# Patient Record
Sex: Female | Born: 1935 | Race: White | Hispanic: No | Marital: Married | Smoking: Former smoker
Health system: Southern US, Community
[De-identification: ages and names within clinical notes are randomized; demographics above are authoritative.]

## PROBLEM LIST (undated history)

## (undated) DIAGNOSIS — K219 Gastro-esophageal reflux disease without esophagitis: Secondary | ICD-10-CM

## (undated) DIAGNOSIS — I639 Cerebral infarction, unspecified: Secondary | ICD-10-CM

## (undated) DIAGNOSIS — I10 Essential (primary) hypertension: Secondary | ICD-10-CM

## (undated) DIAGNOSIS — N200 Calculus of kidney: Secondary | ICD-10-CM

## (undated) DIAGNOSIS — C50919 Malignant neoplasm of unspecified site of unspecified female breast: Secondary | ICD-10-CM

## (undated) DIAGNOSIS — C3490 Malignant neoplasm of unspecified part of unspecified bronchus or lung: Secondary | ICD-10-CM

## (undated) DIAGNOSIS — J449 Chronic obstructive pulmonary disease, unspecified: Secondary | ICD-10-CM

## (undated) HISTORY — PX: CHOLECYSTECTOMY: SHX55

## (undated) HISTORY — PX: BREAST LUMPECTOMY: SHX2

## (undated) HISTORY — PX: HAND SURGERY: SHX662

## (undated) HISTORY — PX: ABDOMINAL HYSTERECTOMY: SHX81

---

## 2004-11-22 ENCOUNTER — Ambulatory Visit: Payer: Self-pay | Admitting: Gastroenterology

## 2004-11-26 ENCOUNTER — Ambulatory Visit: Payer: Self-pay | Admitting: Gastroenterology

## 2004-12-04 ENCOUNTER — Ambulatory Visit: Payer: Self-pay | Admitting: Gastroenterology

## 2005-05-09 ENCOUNTER — Ambulatory Visit: Payer: Self-pay | Admitting: Internal Medicine

## 2005-06-20 ENCOUNTER — Ambulatory Visit: Payer: Self-pay | Admitting: Internal Medicine

## 2006-02-12 ENCOUNTER — Ambulatory Visit: Payer: Self-pay | Admitting: Internal Medicine

## 2006-05-06 ENCOUNTER — Ambulatory Visit: Payer: Self-pay | Admitting: Physical Medicine and Rehabilitation

## 2006-08-19 ENCOUNTER — Ambulatory Visit: Payer: Self-pay | Admitting: Gastroenterology

## 2006-08-21 ENCOUNTER — Ambulatory Visit: Payer: Self-pay | Admitting: Gastroenterology

## 2006-09-15 ENCOUNTER — Ambulatory Visit: Payer: Self-pay | Admitting: Surgery

## 2006-09-17 ENCOUNTER — Ambulatory Visit: Payer: Self-pay | Admitting: Gastroenterology

## 2006-09-23 ENCOUNTER — Ambulatory Visit: Payer: Self-pay | Admitting: Surgery

## 2007-05-04 ENCOUNTER — Ambulatory Visit: Payer: Self-pay | Admitting: Unknown Physician Specialty

## 2007-06-30 ENCOUNTER — Ambulatory Visit: Payer: Self-pay | Admitting: Internal Medicine

## 2007-07-21 ENCOUNTER — Ambulatory Visit: Payer: Self-pay | Admitting: Internal Medicine

## 2007-10-02 ENCOUNTER — Other Ambulatory Visit: Payer: Self-pay

## 2007-10-02 ENCOUNTER — Emergency Department: Payer: Self-pay | Admitting: Emergency Medicine

## 2007-11-12 ENCOUNTER — Ambulatory Visit: Payer: Self-pay | Admitting: Internal Medicine

## 2008-01-29 DIAGNOSIS — I639 Cerebral infarction, unspecified: Secondary | ICD-10-CM

## 2008-01-29 HISTORY — DX: Cerebral infarction, unspecified: I63.9

## 2008-07-12 ENCOUNTER — Ambulatory Visit: Payer: Self-pay | Admitting: Internal Medicine

## 2008-08-12 ENCOUNTER — Ambulatory Visit: Payer: Self-pay | Admitting: Neurology

## 2008-10-12 ENCOUNTER — Ambulatory Visit: Payer: Self-pay | Admitting: Internal Medicine

## 2009-01-11 ENCOUNTER — Ambulatory Visit: Payer: Self-pay | Admitting: Internal Medicine

## 2009-10-03 ENCOUNTER — Ambulatory Visit: Payer: Self-pay | Admitting: Gastroenterology

## 2009-10-04 LAB — PATHOLOGY REPORT

## 2009-10-20 ENCOUNTER — Ambulatory Visit: Payer: Self-pay | Admitting: Otolaryngology

## 2010-02-08 ENCOUNTER — Ambulatory Visit: Payer: Self-pay | Admitting: Internal Medicine

## 2010-02-13 ENCOUNTER — Ambulatory Visit: Payer: Self-pay | Admitting: Internal Medicine

## 2010-05-24 ENCOUNTER — Ambulatory Visit: Payer: Self-pay

## 2010-05-31 ENCOUNTER — Ambulatory Visit: Payer: Self-pay | Admitting: Internal Medicine

## 2010-07-27 ENCOUNTER — Ambulatory Visit: Payer: Self-pay | Admitting: Internal Medicine

## 2010-08-16 ENCOUNTER — Ambulatory Visit: Payer: Self-pay | Admitting: Internal Medicine

## 2011-01-29 DIAGNOSIS — C50919 Malignant neoplasm of unspecified site of unspecified female breast: Secondary | ICD-10-CM

## 2011-01-29 HISTORY — DX: Malignant neoplasm of unspecified site of unspecified female breast: C50.919

## 2011-03-21 ENCOUNTER — Other Ambulatory Visit: Payer: Self-pay | Admitting: Ophthalmology

## 2011-03-21 LAB — CREATININE, SERUM: Creatinine: 1.44 mg/dL — ABNORMAL HIGH (ref 0.60–1.30)

## 2011-03-26 ENCOUNTER — Ambulatory Visit: Payer: Self-pay | Admitting: Ophthalmology

## 2011-09-17 ENCOUNTER — Ambulatory Visit: Payer: Self-pay | Admitting: Internal Medicine

## 2011-09-18 ENCOUNTER — Ambulatory Visit: Payer: Self-pay | Admitting: Internal Medicine

## 2011-10-01 ENCOUNTER — Ambulatory Visit: Payer: Self-pay | Admitting: Surgery

## 2011-10-08 ENCOUNTER — Ambulatory Visit: Payer: Self-pay | Admitting: Surgery

## 2011-10-08 DIAGNOSIS — I10 Essential (primary) hypertension: Secondary | ICD-10-CM

## 2011-10-11 ENCOUNTER — Ambulatory Visit: Payer: Self-pay | Admitting: Surgery

## 2011-10-16 LAB — PATHOLOGY REPORT

## 2011-10-29 ENCOUNTER — Ambulatory Visit: Payer: Self-pay | Admitting: Hematology and Oncology

## 2011-11-20 LAB — CBC CANCER CENTER
Basophil #: 0.1 x10 3/mm (ref 0.0–0.1)
Basophil %: 1.9 %
Eosinophil #: 0.1 x10 3/mm (ref 0.0–0.7)
HCT: 35.7 % (ref 35.0–47.0)
Lymphocyte %: 22.2 %
MCH: 30.2 pg (ref 26.0–34.0)
MCHC: 32.4 g/dL (ref 32.0–36.0)
Monocyte #: 0.6 x10 3/mm (ref 0.2–0.9)
Monocyte %: 11.1 %
Neutrophil #: 3.4 x10 3/mm (ref 1.4–6.5)
Neutrophil %: 62.1 %
Platelet: 279 x10 3/mm (ref 150–440)
RDW: 13.8 % (ref 11.5–14.5)
WBC: 5.4 x10 3/mm (ref 3.6–11.0)

## 2011-11-27 LAB — CBC CANCER CENTER
Basophil %: 2.1 %
Eosinophil %: 2.7 %
HCT: 35.9 % (ref 35.0–47.0)
HGB: 11.6 g/dL — ABNORMAL LOW (ref 12.0–16.0)
Lymphocyte %: 21 %
MCH: 30.4 pg (ref 26.0–34.0)
MCV: 94 fL (ref 80–100)
Monocyte #: 0.5 x10 3/mm (ref 0.2–0.9)
Monocyte %: 10.4 %
Neutrophil #: 3.2 x10 3/mm (ref 1.4–6.5)
Neutrophil %: 63.8 %
Platelet: 266 x10 3/mm (ref 150–440)
RBC: 3.82 10*6/uL (ref 3.80–5.20)
WBC: 5.1 x10 3/mm (ref 3.6–11.0)

## 2011-11-29 ENCOUNTER — Ambulatory Visit: Payer: Self-pay | Admitting: Hematology and Oncology

## 2011-12-04 LAB — CBC CANCER CENTER
Basophil #: 0.1 x10 3/mm (ref 0.0–0.1)
Eosinophil %: 1.8 %
HCT: 34.6 % — ABNORMAL LOW (ref 35.0–47.0)
Lymphocyte #: 0.9 x10 3/mm — ABNORMAL LOW (ref 1.0–3.6)
MCHC: 32.7 g/dL (ref 32.0–36.0)
MCV: 94 fL (ref 80–100)
Monocyte %: 9.8 %
Neutrophil #: 5.6 x10 3/mm (ref 1.4–6.5)
RBC: 3.67 10*6/uL — ABNORMAL LOW (ref 3.80–5.20)
RDW: 13.3 % (ref 11.5–14.5)
WBC: 7.4 x10 3/mm (ref 3.6–11.0)

## 2011-12-11 LAB — CBC CANCER CENTER
Basophil #: 0.1 x10 3/mm (ref 0.0–0.1)
Eosinophil #: 0.2 x10 3/mm (ref 0.0–0.7)
Eosinophil %: 3.7 %
Lymphocyte #: 0.9 x10 3/mm — ABNORMAL LOW (ref 1.0–3.6)
Lymphocyte %: 17.2 %
MCH: 30.7 pg (ref 26.0–34.0)
MCHC: 32.7 g/dL (ref 32.0–36.0)
MCV: 94 fL (ref 80–100)
Monocyte #: 0.7 x10 3/mm (ref 0.2–0.9)
Neutrophil %: 65.4 %
Platelet: 238 x10 3/mm (ref 150–440)
RBC: 3.84 10*6/uL (ref 3.80–5.20)
RDW: 13.4 % (ref 11.5–14.5)
WBC: 5.5 x10 3/mm (ref 3.6–11.0)

## 2011-12-18 LAB — CBC CANCER CENTER
Basophil #: 0.1 x10 3/mm (ref 0.0–0.1)
Basophil %: 1.2 %
Eosinophil #: 0.1 x10 3/mm (ref 0.0–0.7)
HGB: 11.5 g/dL — ABNORMAL LOW (ref 12.0–16.0)
Lymphocyte #: 0.6 x10 3/mm — ABNORMAL LOW (ref 1.0–3.6)
Lymphocyte %: 8.8 %
MCHC: 32.3 g/dL (ref 32.0–36.0)
Neutrophil #: 5.6 x10 3/mm (ref 1.4–6.5)
Platelet: 221 x10 3/mm (ref 150–440)
RBC: 3.75 10*6/uL — ABNORMAL LOW (ref 3.80–5.20)
RDW: 13.7 % (ref 11.5–14.5)
WBC: 7.1 x10 3/mm (ref 3.6–11.0)

## 2011-12-24 LAB — CBC CANCER CENTER
Basophil #: 0.1 x10 3/mm (ref 0.0–0.1)
Eosinophil #: 0.2 x10 3/mm (ref 0.0–0.7)
HCT: 34.4 % — ABNORMAL LOW (ref 35.0–47.0)
Lymphocyte %: 15.5 %
MCH: 30.8 pg (ref 26.0–34.0)
MCHC: 32.5 g/dL (ref 32.0–36.0)
Monocyte #: 0.6 x10 3/mm (ref 0.2–0.9)
Neutrophil %: 66.8 %
Platelet: 193 x10 3/mm (ref 150–440)
RBC: 3.63 10*6/uL — ABNORMAL LOW (ref 3.80–5.20)
WBC: 4.9 x10 3/mm (ref 3.6–11.0)

## 2011-12-29 ENCOUNTER — Ambulatory Visit: Payer: Self-pay | Admitting: Hematology and Oncology

## 2012-01-01 LAB — CBC CANCER CENTER
Eosinophil #: 0.2 x10 3/mm (ref 0.0–0.7)
Eosinophil %: 3.5 %
HCT: 36.3 % (ref 35.0–47.0)
Lymphocyte %: 17.6 %
MCV: 93 fL (ref 80–100)
Monocyte #: 0.6 x10 3/mm (ref 0.2–0.9)
Monocyte %: 12.4 %
Neutrophil %: 65.2 %
Platelet: 219 x10 3/mm (ref 150–440)
RBC: 3.93 10*6/uL (ref 3.80–5.20)
WBC: 5.2 x10 3/mm (ref 3.6–11.0)

## 2012-01-29 ENCOUNTER — Ambulatory Visit: Payer: Self-pay | Admitting: Hematology and Oncology

## 2012-06-24 ENCOUNTER — Ambulatory Visit: Payer: Self-pay | Admitting: Gastroenterology

## 2012-06-25 LAB — PATHOLOGY REPORT

## 2012-09-16 ENCOUNTER — Ambulatory Visit: Payer: Self-pay | Admitting: Surgery

## 2012-12-06 ENCOUNTER — Emergency Department (HOSPITAL_COMMUNITY): Payer: Medicare Other

## 2012-12-06 ENCOUNTER — Encounter (HOSPITAL_COMMUNITY): Payer: Self-pay | Admitting: Emergency Medicine

## 2012-12-06 ENCOUNTER — Observation Stay (HOSPITAL_COMMUNITY)
Admission: EM | Admit: 2012-12-06 | Discharge: 2012-12-07 | Disposition: A | Discharge status: Home / Self Care | Payer: Medicare Other | Attending: Internal Medicine | Admitting: Internal Medicine

## 2012-12-06 DIAGNOSIS — R5381 Other malaise: Secondary | ICD-10-CM | POA: Insufficient documentation

## 2012-12-06 DIAGNOSIS — Z853 Personal history of malignant neoplasm of breast: Secondary | ICD-10-CM | POA: Insufficient documentation

## 2012-12-06 DIAGNOSIS — Z87891 Personal history of nicotine dependence: Secondary | ICD-10-CM | POA: Insufficient documentation

## 2012-12-06 DIAGNOSIS — Z8673 Personal history of transient ischemic attack (TIA), and cerebral infarction without residual deficits: Secondary | ICD-10-CM | POA: Insufficient documentation

## 2012-12-06 DIAGNOSIS — R55 Syncope and collapse: Principal | ICD-10-CM | POA: Diagnosis present

## 2012-12-06 DIAGNOSIS — Z880 Allergy status to penicillin: Secondary | ICD-10-CM | POA: Insufficient documentation

## 2012-12-06 DIAGNOSIS — I10 Essential (primary) hypertension: Secondary | ICD-10-CM | POA: Diagnosis present

## 2012-12-06 DIAGNOSIS — Z87442 Personal history of urinary calculi: Secondary | ICD-10-CM | POA: Insufficient documentation

## 2012-12-06 HISTORY — DX: Malignant neoplasm of unspecified site of unspecified female breast: C50.919

## 2012-12-06 HISTORY — DX: Essential (primary) hypertension: I10

## 2012-12-06 HISTORY — DX: Cerebral infarction, unspecified: I63.9

## 2012-12-06 HISTORY — DX: Calculus of kidney: N20.0

## 2012-12-06 LAB — POCT I-STAT, CHEM 8
Chloride: 103 mEq/L (ref 96–112)
Creatinine, Ser: 1.5 mg/dL — ABNORMAL HIGH (ref 0.50–1.10)
Glucose, Bld: 142 mg/dL — ABNORMAL HIGH (ref 70–99)
HCT: 37 % (ref 36.0–46.0)
Potassium: 3.4 mEq/L — ABNORMAL LOW (ref 3.5–5.1)
Sodium: 137 mEq/L (ref 135–145)

## 2012-12-06 LAB — GLUCOSE, CAPILLARY: Glucose-Capillary: 137 mg/dL — ABNORMAL HIGH (ref 70–99)

## 2012-12-06 LAB — TROPONIN I: Troponin I: <0.3 ng/mL (ref <0.30)

## 2012-12-06 LAB — CBC WITH DIFFERENTIAL/PLATELET
Basophils Absolute: 0.1 10*3/uL (ref 0.0–0.1)
Basophils Relative: 1 % (ref 0–1)
Eosinophils Absolute: 0.2 10*3/uL (ref 0.0–0.7)
Eosinophils Relative: 2 % (ref 0–5)
Lymphs Abs: 2 10*3/uL (ref 0.7–4.0)
MCH: 31.3 pg (ref 26.0–34.0)
Neutro Abs: 6.2 10*3/uL (ref 1.7–7.7)
Neutrophils Relative %: 67 % (ref 43–77)
Platelets: 291 10*3/uL (ref 150–400)
RBC: 3.8 MIL/uL — ABNORMAL LOW (ref 3.87–5.11)
RDW: 13.1 % (ref 11.5–15.5)

## 2012-12-06 LAB — COMPREHENSIVE METABOLIC PANEL
ALT: 8 U/L (ref 0–35)
AST: 15 U/L (ref 0–37)
Albumin: 3.2 g/dL — ABNORMAL LOW (ref 3.5–5.2)
Alkaline Phosphatase: 62 U/L (ref 39–117)
BUN: 23 mg/dL (ref 6–23)
CO2: 25 mEq/L (ref 19–32)
Chloride: 98 mEq/L (ref 96–112)
Creatinine, Ser: 1.26 mg/dL — ABNORMAL HIGH (ref 0.50–1.10)
Potassium: 3.8 mEq/L (ref 3.5–5.1)
Sodium: 133 mEq/L — ABNORMAL LOW (ref 135–145)
Total Bilirubin: 0.3 mg/dL (ref 0.3–1.2)
Total Protein: 5.9 g/dL — ABNORMAL LOW (ref 6.0–8.3)

## 2012-12-06 LAB — URINALYSIS, ROUTINE W REFLEX MICROSCOPIC
Glucose, UA: NEGATIVE mg/dL
Ketones, ur: NEGATIVE mg/dL
Leukocytes, UA: NEGATIVE
Nitrite: NEGATIVE
Protein, ur: NEGATIVE mg/dL
Specific Gravity, Urine: 1.013 (ref 1.005–1.030)
Urobilinogen, UA: 0.2 mg/dL (ref 0.0–1.0)

## 2012-12-06 LAB — URINE MICROSCOPIC-ADD ON

## 2012-12-06 LAB — MAGNESIUM: Magnesium: 1.6 mg/dL (ref 1.5–2.5)

## 2012-12-06 LAB — POCT I-STAT TROPONIN I

## 2012-12-06 MED ORDER — ACETAMINOPHEN 500 MG PO TABS
1000.0000 mg | ORAL_TABLET | Freq: Every evening | ORAL | Status: DC | PRN
  Administered 2012-12-06: 1000 mg via ORAL
  Filled 2012-12-06: qty 2

## 2012-12-06 MED ORDER — HYDROCHLOROTHIAZIDE 25 MG PO TABS
25.0000 mg | ORAL_TABLET | ORAL | Status: DC
  Filled 2012-12-06: qty 1

## 2012-12-06 MED ORDER — SODIUM CHLORIDE 0.9 % IJ SOLN: 3.0000 mL | INTRAMUSCULAR | Status: DC | PRN

## 2012-12-06 MED ORDER — ENALAPRIL MALEATE 20 MG PO TABS: 20.0000 mg | ORAL_TABLET | Freq: Every day | ORAL | Status: DC

## 2012-12-06 MED ORDER — HEPARIN SODIUM (PORCINE) 5000 UNIT/ML IJ SOLN
5000.0000 [IU] | Freq: Three times a day (TID) | INTRAMUSCULAR | Status: DC
  Administered 2012-12-06 – 2012-12-07 (×2): 5000 [IU] via SUBCUTANEOUS
  Filled 2012-12-06 (×5): qty 1

## 2012-12-06 MED ORDER — SODIUM CHLORIDE 0.9 % IJ SOLN
3.0000 mL | Freq: Two times a day (BID) | INTRAMUSCULAR | Status: DC
  Administered 2012-12-06: 3 mL via INTRAVENOUS

## 2012-12-06 MED ORDER — CLOPIDOGREL BISULFATE 75 MG PO TABS
75.0000 mg | ORAL_TABLET | Freq: Every day | ORAL | Status: DC
  Administered 2012-12-06 – 2012-12-07 (×2): 75 mg via ORAL
  Filled 2012-12-06 (×2): qty 1

## 2012-12-06 MED ORDER — ONDANSETRON HCL 4 MG/2ML IJ SOLN
4.0000 mg | Freq: Once | INTRAMUSCULAR | Status: AC
  Administered 2012-12-06: 4 mg via INTRAVENOUS
  Filled 2012-12-06: qty 2

## 2012-12-06 MED ORDER — SODIUM CHLORIDE 0.9 % IJ SOLN
3.0000 mL | Freq: Two times a day (BID) | INTRAMUSCULAR | Status: DC
  Administered 2012-12-07: 3 mL via INTRAVENOUS

## 2012-12-06 MED ORDER — SODIUM CHLORIDE 0.9 % IV SOLN: 250.0000 mL | INTRAVENOUS | Status: DC | PRN

## 2012-12-06 MED ORDER — PANTOPRAZOLE SODIUM 40 MG PO TBEC: 40.0000 mg | DELAYED_RELEASE_TABLET | Freq: Every day | ORAL | Status: DC

## 2012-12-06 NOTE — ED Provider Notes (Signed)
CSN: [id number]     Arrival date & time 12/06/12  1511 History   First MD Initiated Contact with Patient 12/06/12 1514     Chief Complaint  Patient presents with  . Code Stroke   (Consider location/radiation/quality/duration/timing/severity/associated sxs/prior Treatment) HPI History limited by critical acuity of code stroke.    Pt arrived from church function by [facility]. While eating at church function pt stated to family that she was not feeling well and then laid her head down and had a syncopal episode.Sudden onset. Family Denies any fall or hitting head. When fire arrived they noted pt to have right sided weakness and right pupil react slower but EMS did not see that. EMS stated slurred speech until halfway here and started coming around and speech cleared.       Past Medical History  Diagnosis Date  . Hypertension   . Breast cancer 2013    Left side, s/p lumpectomy and radiation  . CVA (cerebral infarction) 2010    Was on Plavix for 2 years, then switched to clopidogrel but d/c due to side effects ("sick")  . Nephrolithiasis     per patient, passes on her own   Past Surgical History  Procedure Laterality Date  . Breast lumpectomy      left side  . Abdominal hysterectomy  1960s    "bleeding profusely"  . Cholecystectomy    . Hand surgery      s/p fall   Family History  Problem Relation Age of Onset  . Throat cancer Mother     heavy smoker & drinker, died at 77 yo (1978)  . Heart failure Father   . Hypertension Sister   . Stroke Sister    History  Substance Use Topics  . Smoking status: Former Smoker -- 0.50 packs/day for 30 years    Types: Cigarettes    Quit date: 12/07/1978  . Smokeless tobacco: Not on file  . Alcohol Use: No   OB History   Grav Para Term Preterm Abortions TAB SAB Ect Mult Living                 Review of Systems .  History limited by critical acuity of code stroke.     Allergies  Aspirin and Penicillins  Home  Medications   No current outpatient prescriptions on file. BP 126/57  Pulse 89  Temp(Src) 98.8 F (37.1 C) (Oral)  Resp 18  Ht 5' (1.524 m)  Wt 155 lb 8 oz (70.534 kg)  BMI 30.37 kg/m2  SpO2 98% Physical Exam Nursing note and vitals reviewed.  Constitutional: Pt is awake and appears stated age. Eyes: No injection, no scleral icterus. HENT: Atraumatic, airway open without erythema or exudate.  Respiratory: No respiratory distress. Equal breathing bilaterally. Cardiovascular: Normal rate. Extremities warm and well perfused.  Abdomen: Soft, non-tender. MSK: Extremities are atraumatic without deformity. Skin: No rash, no wounds.   Neuro: GCS 15, pt oriented to self, able to move all 4 extremities. No obvious facial droop.      ED Course  Procedures (including critical care time) Labs Review Labs Reviewed  GLUCOSE, CAPILLARY - Abnormal; Notable for the following:    Glucose-Capillary 137 (*)    All other components within normal limits  CBC WITH DIFFERENTIAL - Abnormal; Notable for the following:    RBC 3.80 (*)    Hemoglobin 11.9 (*)    HCT 35.2 (*)    All other components within normal limits  URINALYSIS, ROUTINE W  REFLEX MICROSCOPIC - Abnormal; Notable for the following:    Hgb urine dipstick SMALL (*)    All other components within normal limits  COMPREHENSIVE METABOLIC PANEL - Abnormal; Notable for the following:    Sodium 133 (*)    Glucose, Bld 117 (*)    Creatinine, Ser 1.26 (*)    Total Protein 5.9 (*)    Albumin 3.2 (*)    GFR calc non Af Amer 39 (*)    GFR calc Af Amer 45 (*)    All other components within normal limits  POCT I-STAT, CHEM 8 - Abnormal; Notable for the following:    Potassium 3.4 (*)    Creatinine, Ser 1.50 (*)    Glucose, Bld 142 (*)    All other components within normal limits  URINE MICROSCOPIC-ADD ON  TROPONIN I  MAGNESIUM  TROPONIN I  TSH  POCT I-STAT TROPONIN I   Imaging Review Ct Head Wo Contrast  12/06/2012   CLINICAL  DATA:  Code stroke, right-sided weakness  EXAM: CT HEAD WITHOUT CONTRAST  TECHNIQUE: Contiguous axial images were obtained from the base of the skull through the vertex without intravenous contrast.  COMPARISON:  None.  FINDINGS: Diffuse atrophy. No abnormal attenuation to suggest hemorrhage, extra-axial fluid, or vascular territory infarct. Calvarium is intact. No hydrocephalus.  IMPRESSION: No acute findings radiographically. Critical Value/emergent results were called by telephone at the time of interpretation on 12/06/2012 at 3:41 PM to [facility] CAMILO , who verbally acknowledged these results.   Electronically Signed   By: Esperanza Heir M.D.   On: 12/06/2012 15:42   Mr Brain Wo Contrast  12/06/2012   CLINICAL DATA:  Syncope and lethargy  EXAM: MRI HEAD WITHOUT CONTRAST  TECHNIQUE: Multiplanar, multiecho pulse sequences of the brain and surrounding structures were obtained without intravenous contrast.  COMPARISON:  CT head of the same day.  FINDINGS: The diffusion-weighted images demonstrate no evidence for acute or subacute infarction. At 12 mm meningioma is noted just to the right of midline near the vertex.  Moderate generalized atrophy and white matter disease is present are remote lacunar infarcts within the right basal ganglia. Remote lacunar infarcts are present in the cerebellum as well.  Flow is present in the major intracranial arteries. Patient is status post bilateral lens. A polyp or mucous retention cyst is present in the right maxillary sinus. Mild mucosal thickening is present in the ethmoid air cells.  IMPRESSION: 1. No acute intracranial abnormality. 2. Moderate generalized atrophy and white matter disease. This likely reflects ischemia. 3. Remote lacunar infarcts of the right basal ganglia and bilateral cerebellum. 4. 12 mm meningioma just to the right of midline near the vertex.   Electronically Signed   By: Gennette Pac M.D.   On: 12/06/2012 18:45    EKG Interpretation      Ventricular Rate:  77 PR Interval:  163 QRS Duration: 86 QT Interval:  419 QTC Calculation: 474 R Axis:   26 Text Interpretation:  Sinus rhythm RSR' in V1 or V2, probably normal variant No old tracing to compare           MDM   1. Syncope    79 y.o. female w/ PMHx of HTN, prior stroke presents as code stroke via EMS. Pt seen upon arrival protecting her own airway. Normal glucose. Pt to CT scan. EKG NSR, unremarkable.   CT head without bleed per my read. Neurology at bedside. Pt in stable condition on re-eval. Discussed with neuro who recommend  medicine admission. Labs reviewed. Pt admitted in stable condition.     I independently viewed, interpreted, and used in my medical decision making all ordered lab and imaging tests. Medical Decision Making discussed with ED attending Dr. Redgie Grayer.      Charm Barges, MD 12/07/12 [phone]

## 2012-12-06 NOTE — ED Notes (Addendum)
Pt arrived from church function by [facility]. While eating at church function pt stated to family that she was not feeling well and then laid her head down and had a syncopal episode. Family Denies any fall or hitting head. When fire arrived they noted pt to have right sided weakness and right pupil react slower but EMS did not see that. EMS stated slurred speech until halfway here and started coming around and speech cleared. BP-122/66 HR-76

## 2012-12-06 NOTE — ED Notes (Signed)
Transporting patient to new room assignment. 

## 2012-12-06 NOTE — ED Notes (Signed)
Internal Medicine MD at bedside

## 2012-12-06 NOTE — H&P (Signed)
Date: 12/06/2012               Patient Name:  Jo Frank MRN: [id number]  DOB: 07-28-1928 Age / Sex: 79 y.o., female   PCP: Barbette Reichmann, MD         Medical Service: Internal Medicine Teaching Service         Attending Physician: Dr. Burns Spain, MD    First Contact: Dr. Johna Roles Pager: [phone]  Second Contact: Dr. Burtis Junes Pager: [phone]       After Hours (After 5p/  First Contact Pager: [phone]  weekends / holidays): Second Contact Pager: [phone]   Chief Complaint: Syncope  History of Present Illness: Jo Frank is a 79 year old female with a HTN, HLD, CVA, breast CA, Anemia.  She was brought to [facility] by EMS earlier today after she lost conciseness while eating lunch at church.  Patient report that she felt different from her usual self this morning after she got up out of bed.  She reports that she was feeling better at church and was enjoying her meal when she felt ill again and placed her head down on the table and "passed out".  Her daughter reports that she saw her mother placed her head down on the table and became concerned about her.  A nurse in attendance was concerned about a possible stroke and called EMS.  Per the daughter her mother did not seem to regain conscienceness for about 10-15 minutes.  Jo Frank reports that he next thing she remembers was being in the ambulance on the way to the hospital.  Patient reports that before the episode she felt odd and different but no specific symptoms of palpitations, chest pain, N/V, blurry vision, diaphoresis.  She reported no loss of bowel or bladder function, no tongue bitting.  Daughter did not appreciate any convulsive activity.  She reports she continues to feel generally ill like she did before but nothing hurts except for her head after the MRI. She does report a history of syncopal episodes, she reported it happened to her a few times when she was in her 20s-30s but had not had another syncopal episode since her  30s, she never sought medical attention for this. In addition patient reports some increased fatigue over the last few months, she saw her PCP last week who reported she was doing well but had a mild anemia.  She also reports some increased cramping of her extremities over the past year. She reports her CVA was about 4 years ago, she has an allergy to ASA (vomiting blood) so she was placed on Plavix which she took for 2 years until it was changed to generic at which point she reports she was unable to tolerate and stopped taking.   Meds: Current Facility-Administered Medications  Medication Dose Route Frequency Provider Last Rate Last Dose  . clopidogrel (PLAVIX) tablet 75 mg  75 mg Oral Q breakfast Kinnie Scales Rinehuls, PA-C   75 mg at 12/06/12 1704   Current Outpatient Prescriptions  Medication Sig Dispense Refill  . acetaminophen (TYLENOL) 500 MG tablet Take 1,000 mg by mouth at bedtime as needed (pain).      . Cyanocobalamin (VITAMIN B-12 PO) Take 1 tablet by mouth daily. chewable      . enalapril (VASOTEC) 20 MG tablet Take 20 mg by mouth daily.       . hydrochlorothiazide (HYDRODIURIL) 25 MG tablet Take 25 mg by mouth every other day.       Marland Kitchen  hydrocortisone cream 1 % Apply 1 application topically at bedtime.      Marland Kitchen omeprazole (PRILOSEC) 40 MG capsule Take 40 mg by mouth daily.       . Oxymetazoline HCl ([employer] NASAL SPRAY NA) Place 1 spray into the nose at bedtime.      . triamcinolone ointment (KENALOG) 0.1 % Apply 1 application topically at bedtime as needed (rash/itching).         Allergies: Allergies as of 12/06/2012 - Review Complete 12/06/2012  Allergen Reaction Noted  . Aspirin Other (See Comments) 12/06/2012  . Penicillins Swelling 12/06/2012   Past Medical History  Diagnosis Date  . Hypertension   . Breast cancer 2013    Left side, s/p lumpectomy and radiation  . CVA (cerebral infarction) 2010    Was on Plavix for 2 years, then switched to clopidogrel but d/c due to side  effects ("sick")  . Nephrolithiasis     per patient, passes on her own   Past Surgical History  Procedure Laterality Date  . Breast lumpectomy      left side  . Abdominal hysterectomy  1960s    "bleeding profusely"  . Cholecystectomy    . Hand surgery      s/p fall   Family History  Problem Relation Age of Onset  . Throat cancer Mother     heavy smoker & drinker, died at 77 yo (1978)  . Heart failure Father   . Hypertension Sister   . Stroke Sister    History   Social History  . Marital Status: Married    Spouse Name: N/A    Number of Children: 2  . Years of Education: N/A   Occupational History  . Secretary     Retired   Social History Main Topics  . Smoking status: Former Smoker -- 0.50 packs/day for 30 years    Types: Cigarettes    Quit date: 12/07/1978  . Smokeless tobacco: Not on file  . Alcohol Use: No  . Drug Use: No  . Sexual Activity: Not on file   Other Topics Concern  . Not on file   Social History Narrative   Lives with husband who has Parkinson's disease, who she cares for    Review of Systems: Review of Systems  Constitutional: Positive for malaise/fatigue. Negative for fever, chills, weight loss and diaphoresis.  HENT: Negative for congestion, hearing loss, sore throat and tinnitus.   Eyes: Negative for blurred vision and pain.  Respiratory: Negative for cough, sputum production, shortness of breath and wheezing.   Cardiovascular: Negative for chest pain, palpitations and leg swelling.  Gastrointestinal: Negative for heartburn, nausea, vomiting, abdominal pain, diarrhea and constipation.  Genitourinary: Positive for frequency (chronic, +incontence). Negative for dysuria and hematuria.  Musculoskeletal: Positive for back pain (chronic). Negative for falls.  Skin: Negative for rash.  Neurological: Positive for loss of consciousness and headaches (after MRI). Negative for dizziness, tingling, sensory change, speech change, focal weakness,  seizures and weakness.  Psychiatric/Behavioral: Negative for memory loss and substance abuse.     Physical Exam: Blood pressure 153/76, pulse 88, temperature 97.5 F (36.4 C), resp. rate 15, SpO2 97.00%. Physical Exam  Nursing note and vitals reviewed. Constitutional: She is oriented to person, place, and time and well-developed, well-nourished, and in no distress. No distress.  HENT:  Head: Normocephalic and atraumatic.  Eyes: EOM are normal. Pupils are equal, round, and reactive to light.  Neck: Normal range of motion.  Cardiovascular: Normal rate, regular rhythm, normal  heart sounds and intact distal pulses.   No murmur heard. Pulmonary/Chest: Effort normal and breath sounds normal. No respiratory distress. She has no wheezes. She has no rales.  Abdominal: Soft. Bowel sounds are normal. She exhibits no distension. There is no tenderness. There is no rebound.  Musculoskeletal: She exhibits no edema.  TTP over right greater trochanter.  Neurological: She is alert and oriented to person, place, and time. She has normal strength and intact cranial nerves. She is not disoriented. She displays no weakness, no tremor and normal speech. No sensory deficit. She has an abnormal Romberg Test. She has a normal Straight Leg Raise Test, a normal Cerebellar Exam and a normal Finger-Nose-Finger Test. She shows no pronator drift. Gait abnormal.  Reflex Scores:      Bicep reflexes are 2+ on the right side and 2+ on the left side.      Patellar reflexes are 2+ on the right side and 2+ on the left side. Skin: Skin is warm and dry. She is not diaphoretic.  Psychiatric: Memory, affect and judgment normal.     Lab results: Basic Metabolic Panel:  Recent Labs  16/10/96 1520  NA 137  K 3.4*  CL 103  GLUCOSE 142*  BUN 20  CREATININE 1.50*   CBC:  Recent Labs  12/06/12 1520 12/06/12 1555  WBC  --  9.2  NEUTROABS  --  6.2  HGB 12.6 11.9*  HCT 37.0 35.2*  MCV  --  92.6  PLT  --  291    CBG:  Recent Labs  12/06/12 1532  GLUCAP 137*   Urinalysis:  Recent Labs  12/06/12 1853  COLORURINE YELLOW  LABSPEC 1.013  PHURINE 5.0  GLUCOSEU NEGATIVE  HGBUR SMALL*  BILIRUBINUR NEGATIVE  KETONESUR NEGATIVE  PROTEINUR NEGATIVE  UROBILINOGEN 0.2  NITRITE NEGATIVE  LEUKOCYTESUR NEGATIVE     Imaging results:  Ct Head Wo Contrast  12/06/2012   CLINICAL DATA:  Code stroke, right-sided weakness  EXAM: CT HEAD WITHOUT CONTRAST  TECHNIQUE: Contiguous axial images were obtained from the base of the skull through the vertex without intravenous contrast.  COMPARISON:  None.  FINDINGS: Diffuse atrophy. No abnormal attenuation to suggest hemorrhage, extra-axial fluid, or vascular territory infarct. Calvarium is intact. No hydrocephalus.  IMPRESSION: No acute findings radiographically. Critical Value/emergent results were called by telephone at the time of interpretation on 12/06/2012 at 3:41 PM to [facility] CAMILO , who verbally acknowledged these results.   Electronically Signed   By: Esperanza Heir M.D.   On: 12/06/2012 15:42   Mr Brain Wo Contrast  12/06/2012   CLINICAL DATA:  Syncope and lethargy  EXAM: MRI HEAD WITHOUT CONTRAST  TECHNIQUE: Multiplanar, multiecho pulse sequences of the brain and surrounding structures were obtained without intravenous contrast.  COMPARISON:  CT head of the same day.  FINDINGS: The diffusion-weighted images demonstrate no evidence for acute or subacute infarction. At 12 mm meningioma is noted just to the right of midline near the vertex.  Moderate generalized atrophy and white matter disease is present are remote lacunar infarcts within the right basal ganglia. Remote lacunar infarcts are present in the cerebellum as well.  Flow is present in the major intracranial arteries. Patient is status post bilateral lens. A polyp or mucous retention cyst is present in the right maxillary sinus. Mild mucosal thickening is present in the ethmoid air cells.   IMPRESSION: 1. No acute intracranial abnormality. 2. Moderate generalized atrophy and white matter disease. This likely reflects ischemia. 3. Remote  lacunar infarcts of the right basal ganglia and bilateral cerebellum. 4. 12 mm meningioma just to the right of midline near the vertex.   Electronically Signed   By: Gennette Pac M.D.   On: 12/06/2012 18:45    Other results: EKG: NSR, rate 77, Normal axis, No ST or T wave changes.  No previous EKG available for comparision  Assessment & Plan by Problem:   Syncope - DDx Neurocardiogenic (history reveals possible deglutition, no cough, defecation or micturition), Orthostatic hypotension, Cardiovascular (EKG wnl), Neurologic (patient has had previous history of CVA, no obvious seizure activity or post ictal state).  MRI showed no acute stroke, TIA possible. - Admit to Telemetry (inpatient) -Monitor or arhythmia  - AM EKG - Check Orthostatic vital signs if negative will obtain Echo - Carotid dopplers - Cycle Cardiac Enzymes x2 to r/o ACS - Check TSH, CMP - ? Tilt table if recurs -Plavix daily Postural instability - + Romberg, + retropulsion test, slightly unsteady gait - patient does have evidence of cerebellar stroke, no appreciated tremor, bradykinesia, or rigidity.  -Consider further workup as outpatient.   Hypertension -Hold home enalapril until Orthostatic hypotension ruled out. Hypokalemia - K+ 3.4 on ISTAT-8 - Check CMP, Mg++ - Replace as needed Normocytic Anemia - Hgb 11.9 - Defer to PCP treatment.   Meningioma - Found on MRI, possibly related to Breast CA, less likely related to breast cancer radiation. - Consider revaluation in 3-6 months.  Code Status: Full DVT PPx: Heparin [place] Dispo: Disposition is deferred at this time, awaiting improvement of current medical problems. Anticipated discharge in approximately 2 day(s).   The patient does have a current PCP (Barbette Reichmann, MD) and does not need an [facility] hospital follow-up  appointment after discharge.  The patient does not have transportation limitations that hinder transportation to clinic appointments.  Signed: Carlynn Purl, DO 12/06/2012, 8:24 PM

## 2012-12-06 NOTE — Code Documentation (Addendum)
Code stroke called at 1457, Patient arrived to [facility] ED via EMS at 1510, EDP seen at 1512, stroke team at 1508, lab at 1500, Patient in CT scan at 89.  LSN 1426, was at a church function eating lunch when she had a syncopal episode and was unresponsive.  On NRB mask on arrival and Started to become responsive on arrival to ED.  NIHSS 3. Patient denies HA, dizziness or blurred vision.

## 2012-12-06 NOTE — Consult Note (Signed)
Referring Physician: Dr Fredderick Phenix    Chief Complaint: Code Stroke  HPI: Jo Frank is an 79 y.o. female who had gone to church today with her daughter and her husband. After church there was a Development worker, international aid that the family attended. The patient was sitting down eating when she suddenly without warning she passed out and slumped forward. She gradually came around although she was noted to be very lethargic and she was brought to the emergency department as a code stroke. Apparently there are no family members or witnesses available at this time. The patient states that she does not remember any unusual symptoms prior to passing out. Specifically she denied chest pain, shortness of breath, palpitations, headache, or dizziness. She states that she has been very tired for several weeks now. She has been unable to determine the cause. She has a remote history of a syncopal episode but does not remember any details. She has had some right hip pain which she attributes to bursitis. She denies the use of pain medications. She denies having any alcohol with lunch today. A CT scan showed no acute changes. Her NIH scale was a 3. There are no focal deficits on physical exam and it is doubtful that this episode represents a stroke. The patient does state that she had a stroke many years ago; however, she is unable to say when this occurred. Apparently she presented with aphasia at that time but had no other deficits. The patient has no complaints at this time other than feeling cold, sleepy, and having right hip pain. She denies any recent injury to her hip.  Date last known well: Date: 12/06/2012 Time last known well: Time: 14:26 tPA Given: No: The patient's NIH scale is a 2 and there is no convincing evidence for an acute stroke.  Past medical history: - The patient reports having hypertension and hyperlipidemia. She denies diabetes mellitus and heart disease. As noted she had a stroke at some point in the past. She was on  Plavix for a period of time after her stroke but no longer takes this medication. There is apparently a past history of syncope although the patient was unable to give any details.  No past surgical history on file.  Family history: There is no family history of strokes  Social History:  The patient is married and has 2 children. Her husband has Parkinson's disease. They live from [place]. She has a remote tobacco history.  Allergies: The patient is allergic to aspirin and penicillin.  Medications:  No current facility-administered medications for this encounter.   Current Outpatient Prescriptions  Medication Sig Dispense Refill  . enalapril (VASOTEC) 20 MG tablet       . hydrochlorothiazide (HYDRODIURIL) 25 MG tablet       . omeprazole (PRILOSEC) 40 MG capsule       . triamcinolone ointment (KENALOG) 0.1 %          ROS: History obtained from the patient  General ROS: negative for - chills, fatigue, fever, night sweats, weight loss. Positive for weight gain. The patient has felt very tired for approximately 3 weeks. Psychological ROS: negative for - behavioral disorder, hallucinations, memory difficulties, mood swings or suicidal ideation Ophthalmic ROS: negative for - blurry vision, double vision, eye pain or loss of vision ENT ROS: negative for - epistaxis, nasal discharge, oral lesions, sore throat, tinnitus or vertigo Allergy and Immunology ROS: negative for - hives or itchy/watery eyes Hematological and Lymphatic ROS: negative for - bleeding problems,  bruising or swollen lymph nodes Endocrine ROS: negative for - galactorrhea, hair pattern changes, polydipsia/polyuria or temperature intolerance Respiratory ROS: negative for - cough, hemoptysis, shortness of breath or wheezing Cardiovascular ROS: negative for - chest pain, dyspnea on exertion, edema or irregular heartbeat Gastrointestinal ROS: negative for - abdominal pain, diarrhea, hematemesis, nausea/vomiting or stool  incontinence Genito-Urinary ROS: negative for - dysuria, hematuria, incontinence or urinary frequency/urgency Musculoskeletal ROS: negative for - joint swelling or muscular weakness. The patient has had right hip pain for approximately one week which she attributes to bursitis. She denies injury. Neurological ROS: as noted in HPI Dermatological ROS: negative for rash and skin lesion changes   Physical Examination: There were no vitals taken for this visit.  General - somewhat lethargic 79 year old female in no acute distress. Heart - Regular rate and rhythm - no murmer noted. Lungs - Clear to auscultation Abdomen - Soft - non tender Extremities - Distal pulses weak to absent - no edema Skin - cool and dry  Neurologic Examination: Mental Status: Somewhat lethargic but oriented, thought content appropriate.  Speech fluent without evidence of aphasia.  Able to follow 3 step commands without difficulty. Cranial Nerves: II: Discs not visualized; Visual fields grossly normal, pupils equal, round, reactive to light and accommodation III,IV, VI: ptosis not present, extra-ocular motions intact bilaterally V,VII: smile symmetric, facial light touch sensation normal bilaterally VIII: hearing normal bilaterally IX,X: gag reflex present XI: bilateral shoulder shrug XII: midline tongue extension Motor: Right : Upper extremity   4/5    Left:     Upper extremity   4/5  Lower extremity   4/5     Lower extremity   4/5 Tone and bulk:normal tone throughout; no atrophy noted Sensory: Pinprick and light touch intact throughout, bilaterally Deep Tendon Reflexes: 2+ and symmetric throughout Plantars: Right: downgoing   Left: downgoing Cerebellar: normal finger-to-nose, normal rapid alternating movements and normal heel-to-shin test Gait: Deferred   Laboratory Studies:  Basic Metabolic Panel:  Recent Labs Lab 12/06/12 1520  NA 137  K 3.4*  CL 103  GLUCOSE 142*  BUN 20  CREATININE 1.50*     Liver Function Tests: No results found for this basename: AST, ALT, ALKPHOS, BILITOT, PROT, ALBUMIN,  in the last 168 hours No results found for this basename: LIPASE, AMYLASE,  in the last 168 hours No results found for this basename: AMMONIA,  in the last 168 hours  CBC:  Recent Labs Lab 12/06/12 1520  HGB 12.6  HCT 37.0    Cardiac Enzymes: No results found for this basename: CKTOTAL, CKMB, CKMBINDEX, TROPONINI,  in the last 168 hours  BNP: No components found with this basename: POCBNP,   CBG:  Recent Labs Lab 12/06/12 1532  GLUCAP 137*    Microbiology: No results found for this or any previous visit.  Coagulation Studies: No results found for this basename: LABPROT, INR,  in the last 72 hours  Urinalysis: No results found for this basename: COLORURINE, APPERANCEUR, LABSPEC, PHURINE, GLUCOSEU, HGBUR, BILIRUBINUR, KETONESUR, PROTEINUR, UROBILINOGEN, NITRITE, LEUKOCYTESUR,  in the last 168 hours  Lipid Panel: No results found for this basename: chol, trig, hdl, cholhdl, vldl, ldlcalc    HgbA1C:  No results found for this basename: HGBA1C    Urine Drug Screen:   No results found for this basename: labopia, cocainscrnur, labbenz, amphetmu, thcu, labbarb    Alcohol Level: No results found for this basename: ETH,  in the last 168 hours  Other results: EKG: Sinus rhythm rate 77  beats per minute  Imaging:  Ct Head Wo Contrast 12/06/2012    No acute findings radiographically.     Assessment: 79 y.o. female brought to the emergency department as a code stroke after she had a syncopal episode while seated eating lunch. A CT of the head is negative for acute changes. The patient's NIH scale is a 3. She has a remote history of a stroke in the past. She has an aspirin allergy. She was placed on Plavix following her previous stroke but at some point stop taking this medication. We'll check an MRI. Start Plavix therapy.  Stroke Risk Factors - hyperlipidemia and  hypertension  Plan: 1. HgbA1c, fasting lipid panel 2. MRI, MRA  of the brain without contrast 3. PT consult, OT consult, Speech consult 4. Echocardiogram 5. Carotid dopplers 6. Prophylactic therapy- none at this time. Consider restarting Plavix. 7. Risk factor modification 8. Telemetry monitoring 9. Frequent neuro checks   Hassel Neth [facility] Pager [phone] 12/06/2012, 4:23 PM   Patient seen and examined together with physician assistant and I concur with the assessment and plan.  Wyatt Portela, MD

## 2012-12-06 NOTE — ED Notes (Signed)
CBG 137 

## 2012-12-07 ENCOUNTER — Encounter (HOSPITAL_COMMUNITY): Payer: Self-pay | Admitting: *Deleted

## 2012-12-07 DIAGNOSIS — I10 Essential (primary) hypertension: Secondary | ICD-10-CM

## 2012-12-07 DIAGNOSIS — E538 Deficiency of other specified B group vitamins: Secondary | ICD-10-CM

## 2012-12-07 DIAGNOSIS — N289 Disorder of kidney and ureter, unspecified: Secondary | ICD-10-CM

## 2012-12-07 DIAGNOSIS — I059 Rheumatic mitral valve disease, unspecified: Secondary | ICD-10-CM

## 2012-12-07 DIAGNOSIS — R197 Diarrhea, unspecified: Secondary | ICD-10-CM

## 2012-12-07 LAB — TSH: TSH: 0.642 u[IU]/mL (ref 0.350–4.500)

## 2012-12-07 LAB — TROPONIN I: Troponin I: <0.3 ng/mL (ref <0.30)

## 2012-12-07 LAB — GLUCOSE, CAPILLARY: Glucose-Capillary: 75 mg/dL (ref 70–99)

## 2012-12-07 MED ORDER — CLOPIDOGREL BISULFATE 75 MG PO TABS: 75.0000 mg | ORAL_TABLET | Freq: Every day | ORAL | Status: DC

## 2012-12-07 MED ORDER — SODIUM CHLORIDE 0.9 % IV BOLUS (SEPSIS)
500.0000 mL | Freq: Once | INTRAVENOUS | Status: AC
  Administered 2012-12-07: 500 mL via INTRAVENOUS

## 2012-12-07 MED ORDER — NON FORMULARY: 40.0000 mg | Freq: Every day | Status: DC

## 2012-12-07 MED ORDER — PANTOPRAZOLE SODIUM 40 MG PO TBEC: 40.0000 mg | DELAYED_RELEASE_TABLET | Freq: Every day | ORAL | Status: DC

## 2012-12-07 MED ORDER — LOPERAMIDE HCL 2 MG PO CAPS: ORAL_CAPSULE | ORAL | Status: DC

## 2012-12-07 MED ORDER — OMEPRAZOLE 20 MG PO CPDR
40.0000 mg | DELAYED_RELEASE_CAPSULE | Freq: Every day | ORAL | Status: DC
  Administered 2012-12-07: 40 mg via ORAL
  Filled 2012-12-07: qty 2

## 2012-12-07 NOTE — Progress Notes (Signed)
  Echocardiogram 2D Echocardiogram has been performed.  Arvil Chaco 12/07/2012, 1:59 PM

## 2012-12-07 NOTE — H&P (Signed)
  Date: 12/07/2012  Patient name: AVALEEN BROWNLEY  Medical record number: [id number]  Date of birth: December 08, 1928   I have seen and evaluated Lorraine Lax and discussed their care with the Residency Team.   Assessment and Plan: I have seen and evaluated the patient as outlined above. I agree with the formulated Assessment and Plan as detailed in the residents' admission note, with the following changes:   1. Syncope - Pt did have prodrome making cardiac causes less likely and she has been in sinus and has R/O for AMI. Additionally, her CT and MRI head do not support the d/o CVA. She was orthostatic on admission that corrected with IVF. Causes of her orthostatic hypotension could be her HCTZ but she takes it QOD and didn't take in on AM of admission. She has also had several weeks of post-prandial diarrhea that could have resulted in a volume contraction although her BUN and bicarb were not elevated. Will check ECHO for completeness but no indication for carotid dopplers.  2. HTN - pt takes an ACEI QD and HCTZ QOD. Will stop her HCTZ.  3. Renal dysfxn - we do not know baseline. No proteinuria. Follow,  4. Post prandial painless diarrhea - will check to see if she has had Vit B 12, ferritin, Vit D to assess for malabsorption. If she does have indication of malabsorption, then possible etiologies are SBBO, Celiac, microscopic colitis, and pancreatic inusff.   Burns Spain, MD 11/10/201412:11 PM

## 2012-12-07 NOTE — Progress Notes (Signed)
Pt provided with dc instructions and education. Pt verbalized understanding. Pt has no questions at this time. Prescriptions handed to patient. IV removed with tipntact. Heart monitor cleaned and returned to front. Levonne Spiller, RN

## 2012-12-07 NOTE — Progress Notes (Signed)
NEURO HOSPITALIST PROGRESS NOTE   SUBJECTIVE:                                                                                                                        Feeling better. No new neurological developments.  MRI-DWI negative for acute stroke. 12 mm meningioma just to the right of midline near the vertex.   OBJECTIVE:                                                                                                                           Vital signs in last 24 hours: Temp:  [97.5 F (36.4 C)-98.8 F (37.1 C)] 98.3 F (36.8 C) (11/10 0750) Pulse Rate:  [75-104] 89 (11/10 0750) Resp:  [14-18] 18 (11/10 0750) BP: (126-160)/(54-85) 153/65 mmHg (11/10 0750) SpO2:  [94 %-100 %] 99 % (11/10 0750) Weight:  [70.534 kg (155 lb 8 oz)] 70.534 kg (155 lb 8 oz) (11/09 2122)  Intake/Output from previous day:   Intake/Output this shift:   Nutritional status: General  Past Medical History  Diagnosis Date  . Hypertension   . Breast cancer 2013    Left side, s/p lumpectomy and radiation  . CVA (cerebral infarction) 2010    Was on Plavix for 2 years, then switched to clopidogrel but d/c due to side effects ("sick")  . Nephrolithiasis     per patient, passes on her own    Neurologic Exam:  Mental Status:  Alert, awake, oriented, thought content appropriate. Speech fluent without evidence of aphasia. Able to follow 3 step commands without difficulty.  Cranial Nerves:  II: Discs not visualized; Visual fields grossly normal, pupils equal, round, reactive to light and accommodation  III,IV, VI: ptosis not present, extra-ocular motions intact bilaterally  V,VII: smile symmetric, facial light touch sensation normal bilaterally  VIII: hearing normal bilaterally  IX,X: gag reflex present  XI: bilateral shoulder shrug  XII: midline tongue extension  Motor:  Right : Upper extremity 4/5 Left: Upper extremity 4/5  Lower extremity 4/5 Lower extremity  4/5  Tone and bulk:normal tone throughout; no atrophy noted  Sensory: Pinprick and light touch intact throughout, bilaterally  Deep Tendon Reflexes: 2+ and symmetric throughout  Plantars:  Right: downgoing Left: downgoing  Cerebellar:  normal finger-to-nose, normal rapid alternating movements and normal heel-to-shin test  Gait: Deferred   Lab Results: No results found for this basename: cbc, bmp, coags, chol, tri, ldl, hga1c   Lipid Panel No results found for this basename: CHOL, TRIG, HDL, CHOLHDL, VLDL, LDLCALC,  in the last 72 hours  Studies/Results: Ct Head Wo Contrast  12/06/2012   CLINICAL DATA:  Code stroke, right-sided weakness  EXAM: CT HEAD WITHOUT CONTRAST  TECHNIQUE: Contiguous axial images were obtained from the base of the skull through the vertex without intravenous contrast.  COMPARISON:  None.  FINDINGS: Diffuse atrophy. No abnormal attenuation to suggest hemorrhage, extra-axial fluid, or vascular territory infarct. Calvarium is intact. No hydrocephalus.  IMPRESSION: No acute findings radiographically. Critical Value/emergent results were called by telephone at the time of interpretation on 12/06/2012 at 3:41 PM to [facility] , who verbally acknowledged these results.   Electronically Signed   By: Esperanza Heir M.D.   On: 12/06/2012 15:42   Mr Brain Wo Contrast  12/06/2012   CLINICAL DATA:  Syncope and lethargy  EXAM: MRI HEAD WITHOUT CONTRAST  TECHNIQUE: Multiplanar, multiecho pulse sequences of the brain and surrounding structures were obtained without intravenous contrast.  COMPARISON:  CT head of the same day.  FINDINGS: The diffusion-weighted images demonstrate no evidence for acute or subacute infarction. At 12 mm meningioma is noted just to the right of midline near the vertex.  Moderate generalized atrophy and white matter disease is present are remote lacunar infarcts within the right basal ganglia. Remote lacunar infarcts are present in the cerebellum as well.   Flow is present in the major intracranial arteries. Patient is status post bilateral lens. A polyp or mucous retention cyst is present in the right maxillary sinus. Mild mucosal thickening is present in the ethmoid air cells.  IMPRESSION: 1. No acute intracranial abnormality. 2. Moderate generalized atrophy and white matter disease. This likely reflects ischemia. 3. Remote lacunar infarcts of the right basal ganglia and bilateral cerebellum. 4. 12 mm meningioma just to the right of midline near the vertex.   Electronically Signed   By: Gennette Pac M.D.   On: 12/06/2012 18:45    MEDICATIONS                                                                                                                       I have reviewed the patient's current medications.  ASSESSMENT/PLAN:  Syncope. MRI brain negative for acute stroke or other lesions that could explain patient transient alteration of consciousness. Incidental small 12 mm meningioma just to the right of midline near the vertex unrelated to patient clinical presentation. Will sign off. Please, call neurology with any questions or concerns.  Wyatt Portela, MD [facility] [phone]  12/07/2012, 9:04 AM

## 2012-12-07 NOTE — ED Provider Notes (Signed)
I saw and evaluated the patient, reviewed the resident's note and I agree with the findings and plan.  EKG Interpretation     Ventricular Rate:  77 PR Interval:  163 QRS Duration: 86 QT Interval:  419 QTC Calculation: 474 R Axis:   26 Text Interpretation:  Sinus rhythm RSR' in V1 or V2, probably normal variant No old tracing to compare            Code stroke initially - CT neg,neuro deferred admit to medicine.  No TPA per neuro.  VSS. Admit for syncope workup.    Darlys Gales, MD 12/07/12 [phone]

## 2012-12-07 NOTE — Progress Notes (Signed)
Subjective:  Pt seen and examined in AM. No acute events overnight. Pt says she feels great and not sure what happened to her yesterday. She denies weakness, paraesthesias, imbalance, lightheadedness, confusion, speech/visual disturbance, dysphagia, nausea, vomiting, or abdominal pain. She has been having persistent diarrhea for some time and has to frequently go to the bathroom.    Objective: Vital signs in last 24 hours: Filed Vitals:   12/07/12 0603 12/07/12 0606 12/07/12 0750 12/07/12 1200  BP: 142/81 145/83 153/65 145/59  Pulse: 104 102 89 95  Temp:   98.3 F (36.8 C)   TempSrc:      Resp:   18 17  Height:      Weight:      SpO2:   99% 100%   Weight change:  No intake or output data in the 24 hours ending 12/07/12 2123  Constitutional: She is oriented to person, place, and time and well-developed, well-nourished, and in no distress. No distress.  HENT:  Head: Normocephalic and atraumatic.  Eyes: EOM are normal. Pupils are equal, round, and reactive to light.  Neck: Normal range of motion.  Cardiovascular: Normal rate, regular rhythm, normal heart sounds and intact distal pulses.  No murmur heard.  Pulmonary/Chest: Effort normal and breath sounds normal. No respiratory distress. She has no wheezes. She has no rales.  Abdominal: Soft. Bowel sounds are normal. She exhibits no distension. There is no tenderness. There is no rebound.  Musculoskeletal: She exhibits no edema.  Neurological: She is alert and oriented to person, place, and time. She has normal strength and intact cranial nerves. She is not disoriented. She displays no weakness, no tremor and normal speech. No sensory deficit.  Skin: Skin is warm and dry. She is not diaphoretic.  Psychiatric: Memory, affect and judgment normal.    Lab Results: Basic Metabolic Panel:  Recent Labs Lab 12/06/12 1520 12/06/12 2152  NA 137 133*  K 3.4* 3.8  CL 103 98  CO2  --  25  GLUCOSE 142* 117*  BUN 20 23  CREATININE  1.50* 1.26*  CALCIUM  --  9.2  MG  --  1.6   Liver Function Tests:  Recent Labs Lab 12/06/12 2152  AST 15  ALT 8  ALKPHOS 62  BILITOT 0.3  PROT 5.9*  ALBUMIN 3.2*   No results found for this basename: LIPASE, AMYLASE,  in the last 168 hours No results found for this basename: AMMONIA,  in the last 168 hours CBC:  Recent Labs Lab 12/06/12 1520 12/06/12 1555  WBC  --  9.2  NEUTROABS  --  6.2  HGB 12.6 11.9*  HCT 37.0 35.2*  MCV  --  92.6  PLT  --  291   Cardiac Enzymes:  Recent Labs Lab 12/06/12 2152 12/07/12 0625  TROPONINI <0.30 <0.30   BNP: No results found for this basename: PROBNP,  in the last 168 hours D-Dimer: No results found for this basename: DDIMER,  in the last 168 hours CBG:  Recent Labs Lab 12/06/12 1532 12/07/12 0733  GLUCAP 137* 75   Hemoglobin A1C: No results found for this basename: HGBA1C,  in the last 168 hours Fasting Lipid Panel: No results found for this basename: CHOL, HDL, LDLCALC, TRIG, CHOLHDL, LDLDIRECT,  in the last 168 hours Thyroid Function Tests:  Recent Labs Lab 12/06/12 2152  TSH 0.642   Coagulation: No results found for this basename: LABPROT, INR,  in the last 168 hours Anemia Panel: No results found for this basename:  VITAMINB12, FOLATE, FERRITIN, TIBC, IRON, RETICCTPCT,  in the last 168 hours Urine Drug Screen: Drugs of Abuse  No results found for this basename: labopia, cocainscrnur, labbenz, amphetmu, thcu, labbarb    Alcohol Level: No results found for this basename: ETH,  in the last 168 hours Urinalysis:  Recent Labs Lab 12/06/12 1853  COLORURINE YELLOW  LABSPEC 1.013  PHURINE 5.0  GLUCOSEU NEGATIVE  HGBUR SMALL*  BILIRUBINUR NEGATIVE  KETONESUR NEGATIVE  PROTEINUR NEGATIVE  UROBILINOGEN 0.2  NITRITE NEGATIVE  LEUKOCYTESUR NEGATIVE    Micro Results: No results found for this or any previous visit (from the past 240 hour(s)). Studies/Results: Ct Head Wo Contrast  12/06/2012    CLINICAL DATA:  Code stroke, right-sided weakness  EXAM: CT HEAD WITHOUT CONTRAST  TECHNIQUE: Contiguous axial images were obtained from the base of the skull through the vertex without intravenous contrast.  COMPARISON:  None.  FINDINGS: Diffuse atrophy. No abnormal attenuation to suggest hemorrhage, extra-axial fluid, or vascular territory infarct. Calvarium is intact. No hydrocephalus.  IMPRESSION: No acute findings radiographically. Critical Value/emergent results were called by telephone at the time of interpretation on 12/06/2012 at 3:41 PM to [facility] CAMILO , who verbally acknowledged these results.   Electronically Signed   By: Esperanza Heir M.D.   On: 12/06/2012 15:42   Mr Brain Wo Contrast  12/06/2012   CLINICAL DATA:  Syncope and lethargy  EXAM: MRI HEAD WITHOUT CONTRAST  TECHNIQUE: Multiplanar, multiecho pulse sequences of the brain and surrounding structures were obtained without intravenous contrast.  COMPARISON:  CT head of the same day.  FINDINGS: The diffusion-weighted images demonstrate no evidence for acute or subacute infarction. At 12 mm meningioma is noted just to the right of midline near the vertex.  Moderate generalized atrophy and white matter disease is present are remote lacunar infarcts within the right basal ganglia. Remote lacunar infarcts are present in the cerebellum as well.  Flow is present in the major intracranial arteries. Patient is status post bilateral lens. A polyp or mucous retention cyst is present in the right maxillary sinus. Mild mucosal thickening is present in the ethmoid air cells.  IMPRESSION: 1. No acute intracranial abnormality. 2. Moderate generalized atrophy and white matter disease. This likely reflects ischemia. 3. Remote lacunar infarcts of the right basal ganglia and bilateral cerebellum. 4. 12 mm meningioma just to the right of midline near the vertex.   Electronically Signed   By: Gennette Pac M.D.   On: 12/06/2012 18:45   Medications: I have  reviewed the patient's current medications. Scheduled Meds: Continuous Infusions: PRN Meds:.   Assessment/Plan: Principal Problem:   Syncope Active Problems:   Hypertension  Syncope - Most likely TIA considering negative MRI and resolution of symptoms within 24 hrs. She reported weakness & speech difficulty after event. Also with history of CVA 4 years ago. Seizure also possible considering h/o meninigoma Not likely cardiac in nature. Pt also with orthostatic hypotension  on admission that responded to fluids, most likely due to volume depletion in setting of chronic diarrhea. However would not explain LOC while sitting eating food.    - AM EKG -->  NS w/o ischemic change - Obtain 2D Echo  - Cycle Cardiac Enzymes x2 --> negative  - Check TSH (WNL), CMP (WNL) - Plavix daily --> change home prilosec to protonix due to interaction - CMP --> no electrolyte abnormalities, glucose WNL - Per neurology pt's meningioma not cause for syncope   Postural instability - possibly due to B12/folate  deficiency.   - + Romberg, + retropulsion test, slightly unsteady gait  -Consider further workup as outpatient for B12 and folate levels  Hypertension - currently hypertensive. She is on ACEI QD and HCTZ QOD -Due to chronic diarrhea and volume depletion will d/c HCTZ  Hypokalemia - resolved. K+ 3.4 on admission. Most likely due to chronic diarrhea. - Replace K & Mg as needed  Chronic diarrhea - Pt with post-prandial painless diarrhea. Unsure if resolves with fasting. Most likely malabsorption in etiology possibly due to  SBBO (h/o B12 deficiency),  celiac diease (also with anemia, Grandson with celiac) vs lactose intolerance. Recent colonoscopy normal so do not suspect inflammatory etiology. No recent antibiotic use to suggest c. Diff colitis. Microscopic colitis possible.  -Imodium as needed -Outpatient work-up  Renal insufficiency - improved. unknown baseline Cr. Presented with Cr of 1.5 on admission  that improve to 1.26 on discharge. Possibly pre-renal azotemia due to volume depletion from chronic diarrhea.   -Monitor BMP  Hypoalbuminemia - Most likely due to chronic malnutrition in setting of possible malabsorption. No proteinuria or  edema to suggest nephrotic syndrome and LFTs within normal limit.  -Regular diet   Normocytic Anemia - asymptomatic Possible iron deficiency anemia. She is s/p hysterectomy with no reported vaginal or GI bleeding. No h/o gastric ulcers. Recent colonoscopy was normal.  - Monitor CBC - Defer to PCP treatment for anemia panel    Meningioma - Incidental small 12 mm meningioma just to the right of midline near the vertex  - Found on MRI, possibly related to Breast CA, less likely related to breast cancer radiation.  - Consider revaluation in 3-6 months.     Code Status: Full  DVT PPx: Heparin [place]     Dispo: Disposition is deferred at this time, awaiting improvement of current medical problems.  Anticipated discharge  today.   The patient does have a current PCP (Barbette Reichmann, MD) and does need an [facility] hospital follow-up appointment after discharge.  The patient does not have transportation limitations that hinder transportation to clinic appointments.  .Services Needed at time of discharge: Y = Yes, Blank = No PT:   OT:   RN:   Equipment:   Other:     LOS: 1 day   Otis Brace, MD 12/07/2012, 9:23 PM

## 2012-12-08 NOTE — Discharge Summary (Signed)
Name: Jo Frank MRN: [id number] DOB: 1928-10-16 79 y.o. PCP: Barbette Reichmann, MD  Date of Admission: 12/06/2012  3:11 PM Date of Discharge: 12/07/2012 Attending Physician: Blanch Media, MD  Discharge Diagnosis: 1.  Principal Problem:   Syncope Active Problems:   Hypertension  Discharge Medications:   Medication List    STOP taking these medications       hydrochlorothiazide 25 MG tablet  Commonly known as:  HYDRODIURIL     omeprazole 40 MG capsule  Commonly known as:  PRILOSEC      TAKE these medications       acetaminophen 500 MG tablet  Commonly known as:  TYLENOL  Take 1,000 mg by mouth at bedtime as needed (pain).     clopidogrel 75 MG tablet  Commonly known as:  PLAVIX  Take 1 tablet (75 mg total) by mouth daily with breakfast.     [employer] NASAL SPRAY NA  Place 1 spray into the nose at bedtime.     enalapril 20 MG tablet  Commonly known as:  VASOTEC  Take 20 mg by mouth daily.     hydrocortisone cream 1 %  Apply 1 application topically at bedtime.     loperamide 2 MG capsule  Commonly known as:  IMODIUM  Take two tabs po initially, then one tab after each loose stool: max 8 tabs in 24 hours     pantoprazole 40 MG tablet  Commonly known as:  PROTONIX  Take 1 tablet (40 mg total) by mouth daily.     triamcinolone ointment 0.1 %  Commonly known as:  KENALOG  Apply 1 application topically at bedtime as needed (rash/itching).     VITAMIN B-12 PO  Take 1 tablet by mouth daily. chewable        Disposition and follow-up:   [name] was discharged from [facility] in Good condition.  At the hospital follow up visit please address:  1.    Resolution of syncope         Etiology of chronic diarrhea - if imodium helped        Etiology of anemia         B12 deficiency          Blood pressure s/p  d/c HCTZ          Compliance with Plavix and if able to tolerate         Compliance with Protonix and if able tolerate            Etiology of right hip pain - h/o breast cancer        Re-imaging of meningioma (MRI ) in 3-6 months (Feb - May 2015)  2.  Labs / imaging needed at time of follow-up: BMP (Cr) , anemia panel, SPEP, UPEP,  MMA, 25-OH Vit D, ESR, anti-TTG, anti-endomysial, HbA1c, stool tests if necessary (wt/fat, elastase, Na, K, pH, FOBT, culture, O&P, H2 breath test)  3.  Pending labs/ test needing follow-up: 2-D echo (pt did not receive results)  Follow-up Appointments:     Follow-up Information   Follow up with Barbette Reichmann, MD On 12/18/2012. (10:15 AM)    Specialty:  Internal Medicine   Contact information:   [facility]- Internal Medicine [address] [place] [address] [phone]       Discharge Instructions:   Consultations: Treatment Team:  Kym Groom, MD  Procedures Performed:  Ct Head Wo Contrast  12/06/2012   CLINICAL DATA:  Code  stroke, right-sided weakness  EXAM: CT HEAD WITHOUT CONTRAST  TECHNIQUE: Contiguous axial images were obtained from the base of the skull through the vertex without intravenous contrast.  COMPARISON:  None.  FINDINGS: Diffuse atrophy. No abnormal attenuation to suggest hemorrhage, extra-axial fluid, or vascular territory infarct. Calvarium is intact. No hydrocephalus.  IMPRESSION: No acute findings radiographically. Critical Value/emergent results were called by telephone at the time of interpretation on 12/06/2012 at 3:41 PM to [facility] CAMILO , who verbally acknowledged these results.   Electronically Signed   By: Esperanza Heir M.D.   On: 12/06/2012 15:42   Mr Brain Wo Contrast  12/06/2012   CLINICAL DATA:  Syncope and lethargy  EXAM: MRI HEAD WITHOUT CONTRAST  TECHNIQUE: Multiplanar, multiecho pulse sequences of the brain and surrounding structures were obtained without intravenous contrast.  COMPARISON:  CT head of the same day.  FINDINGS: The diffusion-weighted images demonstrate no evidence for acute or subacute infarction.  At 12 mm meningioma is noted just to the right of midline near the vertex.  Moderate generalized atrophy and white matter disease is present are remote lacunar infarcts within the right basal ganglia. Remote lacunar infarcts are present in the cerebellum as well.  Flow is present in the major intracranial arteries. Patient is status post bilateral lens. A polyp or mucous retention cyst is present in the right maxillary sinus. Mild mucosal thickening is present in the ethmoid air cells.  IMPRESSION: 1. No acute intracranial abnormality. 2. Moderate generalized atrophy and white matter disease. This likely reflects ischemia. 3. Remote lacunar infarcts of the right basal ganglia and bilateral cerebellum. 4. 12 mm meningioma just to the right of midline near the vertex.   Electronically Signed   By: Gennette Pac M.D.   On: 12/06/2012 18:45    2D Echo: 12/07/12  Study Conclusions: - Left ventricle: The cavity size was normal. Systolic function was normal. The estimated ejection fraction was in the range of 60% to 65%. Wall motion was normal; there were no regional wall motion abnormalities. Left ventricular diastolic function parameters were normal. - Mitral valve: Mild regurgitation. 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 Can be confirmed using saline contrast injection via left antecubital vein , if clinically important.  Cardiac Cath: none  Admission HPI:  Original author Carlynn Purl, DO  Jo Frank is a 79 year old female with a HTN, HLD, CVA, breast CA, Anemia. She was brought to [facility] by EMS earlier today after she lost conciseness while eating lunch at church. Patient report that she felt different from her usual self this morning after she got up out of bed. She reports that she was feeling better at church and was enjoying her meal when she felt ill again and placed her head down on  the table and "passed out". Her daughter reports that she saw her mother placed her head down on the table and became concerned about her. A nurse in attendance was concerned about a possible stroke and called EMS. Per the daughter her mother did not seem to regain conscienceness for about 10-15 minutes. Mrs. Grunow reports that he next thing she remembers was being in the ambulance on the way to the hospital.   Patient reports that before the episode she felt odd and different but no specific symptoms of palpitations, chest pain, N/V, blurry vision, diaphoresis. She reported no loss of bowel or bladder function, no tongue bitting.  Daughter did not appreciate any convulsive activity. She reports she continues to feel generally ill like she did before but nothing hurts except for her head after the MRI.   She does report a history of syncopal episodes, she reported it happened to her a few times when she was in her 20s-30s but had not had another syncopal episode since her 30s, she never sought medical attention for this.  In addition patient reports some increased fatigue over the last few months, she saw her PCP last week who reported she was doing well but had a mild anemia. She also reports some increased cramping of her extremities over the past year.   She reports her CVA was about 4 years ago, she has an allergy to ASA (vomiting blood) so she was placed on Plavix which she took for 2 years until it was changed to generic at which point she reports she was unable to tolerate and stopped taking.   Hospital Course by problem list: Principal Problem:   Syncope Active Problems:   Hypertension   Syncope - Pt with history of ischemic CVA 4 years ago (?) who presented with episode of syncope while eating. Per nursing report, after syncopal episode she was noted to have right sided weakness, abnormal right pupil reactiveness, and slurred speech concerinng for TIA considering negative MRI and resolution  of symptoms within 24 hrs. CT head did reveal hemorrhage and she was not hypertensive on admission. Seizure less likely considering no seizure activity before episode of syncope and no post-ictal state. Pt does have evidence of small 12 mm meningioma but per neurology would not be cause of syncope. Pt without history of arrhythmias or valvular disease and was not bradycardic when found by EMS. Cardiac enzymes were cycled and were negative. 12-lead EKG did not reveal cardiac ischemia. Patient was monitored on telemetry during hospitalization and no arrhythmias were observed. 2-D echo on 12/07/12 revealed normal systolic (EF 60-65%) and diastolic function without abnormal wall motion.  She was not started on any new medications at home and electrolytes on admission were normal. TSH was also within normal limits.  Pt with orthostatic hypotension on admission that responded to fluids, most likely due to volume depletion in setting of chronic diarrhea, HCTZ use, and anemia. However would not explain LOC while sitting eating food and post-syncope unilateral weakness and speech difficulties. Vasovagal syncope was also unlikely.    Pt returned to her baseline at time of admission and there was no further episodes of syncope during hospitalization. No seizure activity was witnessed and her neurological exam remained unremarkable.   Carotid doppler was not obtained. Pt reported recent lipid panel was normal so levels were not obtained. She did not receive statin therapy during hospitalization and is not on it at home. Pt previously on Plavix after CVA (allergy to ASA) but due to change to generic was not able to tolerate it and has not been taking it for about 2 years. She received Plavix 75 mg daily during hospitalization per neurology recommendations and also prescribed it to continue daily at home.   Hypertension - Pt with orthostatic hypotension on admission that responded to IV fluids. She had well controlled   blood pressure ranging from 126/57- 153/76 during remainder of  hospitalization.She reported to be on enalapril 20 mg daily and HCTZ 25 mg every other day which was not continued during hospitalization. Due to chronic diarrhea and orthostatic hypotension, there was concern for  volume depletion and HCTZ was consequently  discontinued on discharge considering her well-controlled blood pressure.    Chronic diarrhea - Pt with post-prandial painless diarrhea, unknown if improves with fasting. Possibly etiologies include  SBBO (h/o B12 deficiency) vs celiac diease ( anemia,  FH ) vs lactose intolerance vs pancreatic insufficieny vs microscopic colitis. Per pt recent colonoscopy normal so most likely not inflammatory in nature. No recent antibiotic use to obtain c. diff toxin assay. Pt will need outpatient work-up. Consider anemia panel, MMA, 25-OH Vit D, ESR, anti-TTG, anti-endomysial Ab, HbA1c, stool tests if necessary (wt/fat, elastase, Na, K, pH, FOBT, culture, O&P, H2 breath test). Patient was prescribed Imodium as needed for diarrhea and instructed to  take two tabs initially, then one tab after each loose stool with  max 8 tabs in 24 hours.   B12 deficiency - Pt on oral B12 supplementation however unknown if pernicious anemia and unable to absorb B12  which would require IM suppplementation. Pt was reported to have + Romberg test during hospitalization suggesting dorsal column dysfunction possibly due to B12 deficency.  Pt to have further outpatient workup for B12 levels. Etiology of chronic diarrhea could also be due small bowel bacterial overgrowth syndrome from  B12 deficiency.    Renal insufficiency - Presented with Cr of 1.26 on admission with unknown baseline. There was no proteinuria on UA.  If AKI, most likely pre-renal azotemia due to volume depletion from chronic diarrhea. Pt to have repeat BMP at outpatient follow-up visit .     Hypoalbuminemia - Pt with milldy low albumin 3.2 on admission most  likely due to chronic malnutrition in setting of  chronic diarrhea. There was no concern of nephrotic syndrome or liver failure. Patient was continued on regular diet during hospitalization.    Normocytic Anemia - Pt with stable hemoglobin during hospitalization and no reports of bleeding.  Etiology unknown. Pt with history of B12 deficiency.  It was unknown if due to pernicious anemia, however would typically be macrocytic anemia. She is s/p hysterectomy with no reported vaginal or GI bleeding. No h/o gastric ulcers and recent colonoscopy was normal. In setting of chronic diarrhea, could be iron-deficiency anemia due to celiac disease. Pt's daughter reported family member with celiac disease. Although corrected calcium normal, due to new onset right hip pain, fatigue, anemia, and mild renal insufficiency, consider obtaining SPEP and UPEP at outpatient follow-up.     Right Hip Pain - Pt reports 1 week history of right sided hip pain without recent injury or fall. On exam there was tenderness to palpation over right greater trochanter. Due to history of cancer,  fatigue, and general feeling of unhealth for weeks consider imaging as outpatient for etiology.  Although corrected calcium normal,  due to anemia and mild renal insufficiency, consider obtaining SPEP and UPEP at outpatient follow-up.     Meningioma - Pt found to have incidental small 12 mm meningioma just to the right of midline near the vertex. Per neurology not likely cause of symptoms. Pt recommended to have re-imaging in 3-6 months.   GERD- Pt on omeprazole 40 mg daily at home. Due to interaction with newly started plavix, pharmacy suggested  protonix 40 mg which was prescribed for home use.     DVT Prophylaxis - Pt received SQ heparin during hospitalization. There was no reports of leg pain/swelling, dyspnea/pleuritic CP or bleeding/bruising during hospitalization. Platelet counts remained normal and there was no concern for HIT syndrome.        Discharge Vitals:   BP 145/59  Pulse 95  Temp(Src) 98.3 F (36.8 C) (Oral)  Resp 17  Ht 5' (1.524 m)  Wt 70.534 kg (155 lb 8 oz)  BMI 30.37 kg/m2  SpO2 100%  Discharge Labs:  No results found for this or any previous visit (from the past 24 hour(s)).  Signed: Otis Brace, MD 12/08/2012, 5:10 PM   Time Spent on Discharge: 50  minutes Services Ordered on Discharge: none Equipment Ordered on Discharge: none

## 2013-09-17 ENCOUNTER — Ambulatory Visit: Payer: Self-pay | Admitting: Surgery

## 2014-05-17 NOTE — Op Note (Signed)
PATIENT NAME:  Jo Frank, Jo Frank MR#:  [id number] DATE OF BIRTH:  [birth year]  DATE OF PROCEDURE:  10/11/2011  PREOPERATIVE DIAGNOSIS: Carcinoma of the right breast.   POSTOPERATIVE DIAGNOSIS: Carcinoma of the right breast.   PROCEDURE: Right partial mastectomy with axillary sentinel lymph node biopsy.   SURGEON: Rochel Brome, M.D.   ANESTHESIA: General.   INDICATION: This 81 year old female recently had a mammogram depicting a mass in the upper aspect of the right breast. She had a needle biopsy demonstrating malignancy and surgery was recommended for definitive treatment. She did have preoperative x-ray needle localization and preoperative injection of radioactive technetium sulfur colloid.   DESCRIPTION OF PROCEDURE: The patient was placed on the operating table in the supine position under general anesthesia. The right arm was placed on a lateral arm support. The dressing was removed from the right breast, exposing the Kopans wire which was entering the breast at approximately the 12 o'clock position. The wire was cut 3 cm from the skin. The breast, upper arm, and surrounding chest wall were prepared with ChloraPrep and draped in a sterile manner.   The gamma counter was used to probe the axilla, demonstrating location of radioactivity. An oblique incision was made in the inferior aspect of the axilla and carried down through subcutaneous tissues through superficial fascia deep within the axilla adjacent to the chest wall where a lymph node with radioactivity was found. This lymph node was dissected free from surrounding structures and the ex vivo count was in the range of 300 to 400 counts per second. It was submitted fresh for frozen section. The background count was less than 12. The axilla was palpated. There was no palpable mass within the axilla. Several small bleeding points were cauterized. Hemostasis was subsequently intact.   Next, attention was turned to the partial mastectomy. The  incision was made from the 10 o'clock to the 1 o'clock position, was curvilinear, and removed approximately a 12-mm wide ellipse of skin. Dissection was carried down through subcutaneous tissues using electrocautery for hemostasis. Dissection in the upper aspect of the wound was carried down to encounter the wire. There was a palpable mass which the wire entered and this mass was palpated during the course of dissection and dissected widely around the mass and excised it with the wire intact. The 1 o'clock  end of the skin ellipse was tagged with a stitch and also margin markers were sutured to the skin to mark the medial, lateral, cranial, caudal, and deep margins. There was no gross appearance of tumor in the cut margins and this was submitted for pathology. The wound was inspected. It is noted that one clamped vessel was suture ligated with 4-0 chromic. Multiple small bleeding points were cauterized. The subcutaneous tissues of both incisions were infiltrated with 0.5% Sensorcaine with epinephrine and also some of the deeper tissues in the partial mastectomy wound were infiltrated.   The pathologist called back two times during the course of surgery, first to indicate that the sentinel lymph node specimen contained actually two nodes and these appeared to be negative for malignancy.   The pathologist called back the second time to indicate that the margins appeared to be satisfactory for the partial mastectomy specimen.  The axillary wound was closed with a running 5-0 Monocryl subcuticular suture. The partial mastectomy wound was closed with a row of interrupted 4-0 chromic simple sutures in the subcutaneous tissues, and then closed the skin with a running 5-0 Monocryl subcuticular suture.   Both  wounds were treated with Dermabond and allowed to dry. The patient tolerated surgery satisfactorily and was then prepared for transfer to the recovery room.  ____________________________ Lenna Sciara. Rochel Brome,  MD jws:bjt D: 10/11/2011 12:50:41 ET T: 10/11/2011 13:20:39 ET JOB#: [id number]  cc: Loreli Dollar, MD, <Dictator> Loreli Dollar MD ELECTRONICALLY SIGNED 10/17/2011 18:23

## 2014-07-28 ENCOUNTER — Other Ambulatory Visit: Payer: Self-pay | Admitting: Internal Medicine

## 2014-07-28 DIAGNOSIS — J984 Other disorders of lung: Secondary | ICD-10-CM

## 2014-07-29 ENCOUNTER — Ambulatory Visit
Admission: RE | Admit: 2014-07-29 | Discharge: 2014-07-29 | Disposition: A | Discharge status: Home / Self Care | Payer: PPO | Source: Ambulatory Visit | Attending: Internal Medicine | Admitting: Internal Medicine

## 2014-07-29 DIAGNOSIS — R911 Solitary pulmonary nodule: Secondary | ICD-10-CM | POA: Diagnosis not present

## 2014-07-29 DIAGNOSIS — R918 Other nonspecific abnormal finding of lung field: Secondary | ICD-10-CM | POA: Diagnosis present

## 2014-07-29 DIAGNOSIS — J984 Other disorders of lung: Secondary | ICD-10-CM

## 2014-07-29 DIAGNOSIS — J90 Pleural effusion, not elsewhere classified: Secondary | ICD-10-CM | POA: Insufficient documentation

## 2014-07-29 MED ORDER — IOHEXOL 300 MG/ML  SOLN
75.0000 mL | Freq: Once | INTRAMUSCULAR | Status: AC | PRN
  Administered 2014-07-29: 75 mL via INTRAVENOUS

## 2014-08-08 ENCOUNTER — Encounter: Payer: Self-pay | Admitting: Oncology

## 2014-08-08 ENCOUNTER — Ambulatory Visit: Payer: PPO

## 2014-08-08 ENCOUNTER — Inpatient Hospital Stay: Payer: PPO | Attending: Oncology | Admitting: Oncology

## 2014-08-08 VITALS — BP 140/86 | HR 105 | Temp 96.8°F | Wt 151.7 lb

## 2014-08-08 DIAGNOSIS — R05 Cough: Secondary | ICD-10-CM | POA: Diagnosis not present

## 2014-08-08 DIAGNOSIS — R0602 Shortness of breath: Secondary | ICD-10-CM | POA: Insufficient documentation

## 2014-08-08 DIAGNOSIS — Z853 Personal history of malignant neoplasm of breast: Secondary | ICD-10-CM

## 2014-08-08 DIAGNOSIS — E785 Hyperlipidemia, unspecified: Secondary | ICD-10-CM | POA: Insufficient documentation

## 2014-08-08 DIAGNOSIS — Z90710 Acquired absence of both cervix and uterus: Secondary | ICD-10-CM | POA: Insufficient documentation

## 2014-08-08 DIAGNOSIS — Z87891 Personal history of nicotine dependence: Secondary | ICD-10-CM | POA: Diagnosis not present

## 2014-08-08 DIAGNOSIS — M549 Dorsalgia, unspecified: Secondary | ICD-10-CM

## 2014-08-08 DIAGNOSIS — Z8673 Personal history of transient ischemic attack (TIA), and cerebral infarction without residual deficits: Secondary | ICD-10-CM | POA: Insufficient documentation

## 2014-08-08 DIAGNOSIS — N281 Cyst of kidney, acquired: Secondary | ICD-10-CM | POA: Insufficient documentation

## 2014-08-08 DIAGNOSIS — I10 Essential (primary) hypertension: Secondary | ICD-10-CM | POA: Diagnosis not present

## 2014-08-08 DIAGNOSIS — R918 Other nonspecific abnormal finding of lung field: Secondary | ICD-10-CM | POA: Diagnosis not present

## 2014-08-08 DIAGNOSIS — R5383 Other fatigue: Secondary | ICD-10-CM | POA: Insufficient documentation

## 2014-08-08 DIAGNOSIS — R531 Weakness: Secondary | ICD-10-CM | POA: Diagnosis not present

## 2014-08-08 DIAGNOSIS — Z9049 Acquired absence of other specified parts of digestive tract: Secondary | ICD-10-CM | POA: Diagnosis not present

## 2014-08-08 DIAGNOSIS — C50919 Malignant neoplasm of unspecified site of unspecified female breast: Secondary | ICD-10-CM

## 2014-08-08 DIAGNOSIS — D649 Anemia, unspecified: Secondary | ICD-10-CM | POA: Insufficient documentation

## 2014-08-08 DIAGNOSIS — Z923 Personal history of irradiation: Secondary | ICD-10-CM | POA: Insufficient documentation

## 2014-08-08 DIAGNOSIS — E538 Deficiency of other specified B group vitamins: Secondary | ICD-10-CM | POA: Insufficient documentation

## 2014-08-08 DIAGNOSIS — R160 Hepatomegaly, not elsewhere classified: Secondary | ICD-10-CM | POA: Insufficient documentation

## 2014-08-08 DIAGNOSIS — K219 Gastro-esophageal reflux disease without esophagitis: Secondary | ICD-10-CM | POA: Insufficient documentation

## 2014-08-08 DIAGNOSIS — Z9011 Acquired absence of right breast and nipple: Secondary | ICD-10-CM | POA: Diagnosis not present

## 2014-08-08 DIAGNOSIS — Z809 Family history of malignant neoplasm, unspecified: Secondary | ICD-10-CM | POA: Diagnosis not present

## 2014-08-08 DIAGNOSIS — I2510 Atherosclerotic heart disease of native coronary artery without angina pectoris: Secondary | ICD-10-CM

## 2014-08-08 DIAGNOSIS — Z87442 Personal history of urinary calculi: Secondary | ICD-10-CM | POA: Diagnosis not present

## 2014-08-08 DIAGNOSIS — R413 Other amnesia: Secondary | ICD-10-CM | POA: Insufficient documentation

## 2014-08-08 DIAGNOSIS — G8929 Other chronic pain: Secondary | ICD-10-CM | POA: Insufficient documentation

## 2014-08-08 DIAGNOSIS — J449 Chronic obstructive pulmonary disease, unspecified: Secondary | ICD-10-CM | POA: Insufficient documentation

## 2014-08-08 LAB — COMPREHENSIVE METABOLIC PANEL
ALK PHOS: 70 U/L (ref 38–126)
ALT: 14 U/L (ref 14–54)
AST: 17 U/L (ref 15–41)
Albumin: 3.3 g/dL — ABNORMAL LOW (ref 3.5–5.0)
Anion gap: 6 (ref 5–15)
BILIRUBIN TOTAL: 0.7 mg/dL (ref 0.3–1.2)
BUN: 23 mg/dL — ABNORMAL HIGH (ref 6–20)
CALCIUM: 8.7 mg/dL — AB (ref 8.9–10.3)
CHLORIDE: 102 mmol/L (ref 101–111)
CO2: 26 mmol/L (ref 22–32)
CREATININE: 1.22 mg/dL — AB (ref 0.44–1.00)
GFR calc Af Amer: 46 mL/min — ABNORMAL LOW (ref >60)
GFR, EST NON AFRICAN AMERICAN: 40 mL/min — AB (ref >60)
Glucose, Bld: 96 mg/dL (ref 65–99)
Potassium: 4.2 mmol/L (ref 3.5–5.1)
Sodium: 134 mmol/L — ABNORMAL LOW (ref 135–145)
Total Protein: 6.6 g/dL (ref 6.5–8.1)

## 2014-08-08 LAB — CBC WITH DIFFERENTIAL/PLATELET
Basophils Absolute: 0.2 10*3/uL — ABNORMAL HIGH (ref 0–0.1)
Basophils Relative: 2 %
EOS PCT: 2 %
Eosinophils Absolute: 0.1 10*3/uL (ref 0–0.7)
HCT: 35.5 % (ref 35.0–47.0)
Hemoglobin: 11.2 g/dL — ABNORMAL LOW (ref 12.0–16.0)
Lymphocytes Relative: 9 %
Lymphs Abs: 0.8 10*3/uL — ABNORMAL LOW (ref 1.0–3.6)
MCH: 27.3 pg (ref 26.0–34.0)
MCHC: 31.5 g/dL — ABNORMAL LOW (ref 32.0–36.0)
MCV: 86.7 fL (ref 80.0–100.0)
MONOS PCT: 6 %
Monocytes Absolute: 0.5 10*3/uL (ref 0.2–0.9)
Neutro Abs: 6.8 10*3/uL — ABNORMAL HIGH (ref 1.4–6.5)
Neutrophils Relative %: 81 %
Platelets: 401 10*3/uL (ref 150–440)
RBC: 4.09 MIL/uL (ref 3.80–5.20)
RDW: 14.9 % — AB (ref 11.5–14.5)
WBC: 8.4 10*3/uL (ref 3.6–11.0)

## 2014-08-08 LAB — PROTIME-INR
INR: 1.01
Prothrombin Time: 13.5 seconds (ref 11.4–15.0)

## 2014-08-08 LAB — APTT: APTT: 27 s (ref 24–36)

## 2014-08-08 NOTE — Progress Notes (Signed)
Patient does have living will.  Former smoker.

## 2014-08-08 NOTE — Progress Notes (Signed)
[place] @ [facility] [phone]  Fax:([phone]) [place] OB: [birth year]  MR#: [id number]  JKK#:[id number]  Patient Care Team: [name], MD as PCP - General (Internal Medicine)  CHIEF COMPLAINT:  Chief Complaint  Patient presents with  . New Evaluation    VISIT DIAGNOSIS:     ICD-9-CM ICD-10-CM   1. Breast cancer, unspecified laterality 174.9 C50.919 albuterol (PROAIR HFA) 108 (90 BASE) MCG/ACT inhaler     losartan (COZAAR) Jo MG tablet     CBC with Differential     Comprehensive metabolic panel     Cancer antigen 27.29     Protime-INR     APTT     NM PET Image Initial (PI) Skull Base To Thigh     US Biopsy     CBC with Differential     Comprehensive metabolic panel     Cancer antigen 27.29     Protime-INR     APTT  2. Liver mass 573.9 R16.0 albuterol (PROAIR HFA) 108 (90 BASE) MCG/ACT inhaler     losartan (COZAAR) Jo MG tablet     CBC with Differential     Comprehensive metabolic panel     Cancer antigen 27.29     Protime-INR     APTT     NM PET Image Initial (PI) Skull Base To Thigh     US Biopsy     CBC with Differential     Comprehensive metabolic panel     Cancer antigen 27.29     Protime-INR     APTT     Oncology History   1.  Diagnosis of right breast cancer T1 cN0 M0 tumor triple negative disease status post partial mastectomy in September 2013.  Patient underwent lumpectomy radiation therapy there is no record of evaluation by oncologist. 2.  CT scan of the chest revealed multiple lung nodules and the one nodule in July of 2016     Breast CA   10/09/2011 Initial Diagnosis Breast CA stage IC triple negative disease status post lumpectomy and radiation therapy    Oncology Flowsheet 12/06/2012  ondansetron (ZOFRAN) IV 4 mg    INTERVAL HISTORY:  81 year old [name] started feeling weak and tired.  Was evaluated by primary care physician initially she received a course of prednisone and antibiotic without  much relief than patient was evaluated had a chest x-ray followed by CT scan.  CT scan revealed right upper lobe lung mass multiple lung nodules and a liver nodule.  Patient had a previous history of node-negative carcinoma of breast. Feeling weak and tired.  No hemoptysis no chest pain no bony pains. REVIEW OF SYSTEMS:    general status: Patient is feeling weak and tired.  No change in a performance status.  No chills.  No fever. HEENT  No evidence of stomatitis Lungs: No cough or shortness of breath Cardiac: No chest pain or paroxysmal nocturnal dyspnea GI: No nausea no vomiting no diarrhea no abdominal pain Skin: No rash Lower extremity no swelling Neurological system: No tingling.  No numbness.  No other focal signs Musculoskeletal system no bony pains  GU: No hematuria.  No dysuria.   As per HPI. Otherwise, a complete review of systems is negatve.  PAST MEDICAL HISTORY: Past Medical History  Diagnosis Date  . Hypertension   . CVA (cerebral infarction) 2010    Was on Plavix for 2 years, then switched to clopidogrel but d/c due to side effects ("sick")  .  Nephrolithiasis     per patient, passes on her own  . Breast cancer 2013    Left side, s/p lumpectomy and radiation    PAST SURGICAL HISTORY: Past Surgical History  Procedure Laterality Date  . Breast lumpectomy      left side  . Abdominal hysterectomy  1960s    "bleeding profusely"  . Cholecystectomy    . Hand surgery      s/p fall    FAMILY HISTORY Family History  Problem Relation Age of Onset  . Throat cancer Mother     heavy smoker & drinker, died at 79 yo (1978)  . Heart failure Father   . Hypertension Sister   . Stroke Sister          ADVANCED DIRECTIVES:  Patient does have advance healthcare directive, Patient   does not desire to make any changes  HEALTH MAINTENANCE: History  Substance Use Topics  . Smoking status: Former Smoker -- 0.Jo packs/day for 30 years    Types: Cigarettes    Quit date:  12/07/1978  . Smokeless tobacco: Not on file  . Alcohol Use: No     Allergies  Allergen Reactions  . Aspirin Other (See Comments)    Vomits blood Vomits blood  . Nitrofuran Derivatives Nausea And Vomiting    Unspecified  . Penicillins Swelling and Other (See Comments)    Throat swells Tongue and throat swelled closed    Current Outpatient Prescriptions  Medication Sig Dispense Refill  . acetaminophen (TYLENOL) 500 MG tablet Take 1,000 mg by mouth at bedtime as needed (pain).    Marland Kitchen albuterol (PROAIR HFA) 108 (90 BASE) MCG/ACT inhaler Inhale into the lungs.    . clopidogrel (PLAVIX) 75 MG tablet Take 1 tablet (75 mg total) by mouth daily with breakfast. 90 tablet 0  . Cyanocobalamin (VITAMIN B-12 PO) Take 1 tablet by mouth daily. chewable    . enalapril (VASOTEC) 20 MG tablet Take 20 mg by mouth daily.     Marland Kitchen losartan (COZAAR) Jo MG tablet Take by mouth.    . Oxymetazoline HCl ([employer] NASAL SPRAY NA) Place 1 spray into the nose at bedtime.    . hydrocortisone cream 1 % Apply 1 application topically at bedtime.    Marland Kitchen loperamide (IMODIUM) 2 MG capsule Take two tabs po initially, then one tab after each loose stool: max 8 tabs in 24 hours (Patient not taking: Reported on 08/08/2014) 30 capsule 0  . pantoprazole (PROTONIX) 40 MG tablet Take 1 tablet (40 mg total) by mouth daily. (Patient not taking: Reported on 08/08/2014) 90 tablet 0  . triamcinolone ointment (KENALOG) 0.1 % Apply 1 application topically at bedtime as needed (rash/itching).      No current facility-administered medications for this visit.    OBJECTIVE: PHYSICAL EXAM: GENERAL:  Well developed, well nourished, sitting comfortably in the exam room in no acute distress. MENTAL STATUS:  Alert and oriented to person, place and time. HEAD.  Normocephalic, atraumatic, face symmetric, no Cushingoid features. EYES:  Pupils equal round and reactive to light and accomodation.  No conjunctivitis or scleral icterus. ENT:  Oropharynx  clear without lesion.  Tongue normal. Mucous membranes moist.  RESPIRATORY:  Clear to auscultation without rales, wheezes or rhonchi. CARDIOVASCULAR:  Regular rate and rhythm without murmur, rub or gallop. BREAST:  Right breast without masses status post lumpectomy and radiation changes, skin changes or nipple discharge.  Left breast without masses, skin changes or nipple discharge. ABDOMEN:  Soft, non-tender, with active  bowel sounds, and no hepatosplenomegaly.  No masses. BACK:  No CVA tenderness.  No tenderness on percussion of the back or rib cage. SKIN:  No rashes, ulcers or lesions. EXTREMITIES: No edema, no skin discoloration or tenderness.  No palpable cords. LYMPH NODES: No palpable cervical, supraclavicular, axillary or inguinal adenopathy  NEUROLOGICAL: Unremarkable. PSYCH:  Appropriate.  Slightly apprehensive and anxious  Filed Vitals:   08/08/14 0832  BP: 140/86  Pulse: 105  Temp: 96.8 F (36 C)     Body mass index is 28.67 kg/(m^2).    ECOG FS:1 - Symptomatic but completely ambulatory  LAB RESULTS:  Office Visit on 08/08/2014  Component Date Value Ref Range Status  . WBC 08/08/2014 8.4  3.6 - 11.0 K/uL Final  . RBC 08/08/2014 4.09  3.80 - 5.20 MIL/uL Final  . Hemoglobin 08/08/2014 11.2* 12.0 - 16.0 g/dL Final  . HCT 08/08/2014 35.5  35.0 - 47.0 % Final  . MCV 08/08/2014 86.7  80.0 - 100.0 fL Final  . MCH 08/08/2014 27.3  26.0 - 34.0 pg Final  . MCHC 08/08/2014 31.5* 32.0 - 36.0 g/dL Final  . RDW 08/08/2014 14.9* 11.5 - 14.5 % Final  . Platelets 08/08/2014 401  150 - 440 K/uL Final  . Neutrophils Relative % 08/08/2014 81   Final  . Neutro Abs 08/08/2014 6.8* 1.4 - 6.5 K/uL Final  . Lymphocytes Relative 08/08/2014 9   Final  . Lymphs Abs 08/08/2014 0.8* 1.0 - 3.6 K/uL Final  . Monocytes Relative 08/08/2014 6   Final  . Monocytes Absolute 08/08/2014 0.5  0.2 - 0.9 K/uL Final  . Eosinophils Relative 08/08/2014 2   Final  . Eosinophils Absolute 08/08/2014 0.1  0 - 0.7  K/uL Final  . Basophils Relative 08/08/2014 2   Final  . Basophils Absolute 08/08/2014 0.2* 0 - 0.1 K/uL Final     STUDIES: Ct Chest W Contrast  07/29/2014   CLINICAL DATA:  Right upper lobe lung mass.  EXAM: CT CHEST WITH CONTRAST  TECHNIQUE: Multidetector CT imaging of the chest was performed during intravenous contrast administration.  CONTRAST:  58m OMNIPAQUE IOHEXOL 300 MG/ML  SOLN  COMPARISON:  CT chest dated 05/31/2010  FINDINGS: Mediastinum/Nodes: Heart is normal in size. No pericardial effusion.  Coronary atherosclerosis.  Atherosclerotic calcifications of the aortic arch.  Thoracic lymphadenopathy, including:  --10 mm short axis left supraclavicular node (series 2/ image 5)  --12 mm short axis right paratracheal node (series 2/ image 18)  --13 mm short axis low right paratracheal node (series 2/ image 22)  --11 mm short axis subcarinal node (series 2/ image 24)  --13 mm short axis right hilar node (series 2/ image 24)  --14 mm short axis azygoesophageal recess node (series 2/image 20)  Lungs/Pleura: 3.0 x 2.9 x 2.9 cm spiculated right upper lobe mass (series 3/image 18), corresponding to the previous 2.1 x 1.7 cm subsolid/ground-glass nodule, worrisome for primary bronchogenic neoplasm. Associated pleural involvement anteriorly.  Adjacent satellite nodularity (series 3/ image 18). Additional 2.2 cm sub solid/ ground-glass nodule in the superior right lower lobe (series 3/ image 22), previously reflecting a 1.5 cm ground-glass nodule, worrisome for additional focus of neoplasm.  Multifocal tree-in-bud nodules in the bilateral lower lobes (series 3/ image 39), possibly infectious/ inflammatory.  Underlying mild emphysematous changes.  Radiation changes in the anterior right upper lobe.  Small right pleural effusion.  No pneumothorax.  Upper abdomen: Visualized upper abdomen is notable for a 2.4 cm lesion in the right liver (  series 2/ image 48), worrisome for metastasis.  Additionally present is a 7  mm nonobstructing right renal calculus (series 2/ image 49), an 11.6 cm left renal cyst (series 2/ image 54), small right renal cysts, and vascular calcifications.  Musculoskeletal: Degenerative changes of the visualized thoracolumbar spine.  IMPRESSION: 3.0 cm spiculated right upper lobe mass, increased, worrisome for primary bronchogenic neoplasm. Associated pleural/anterior chest wall involvement. Additional right upper lobe satellite nodularity.  2.2 cm subsolid/ground-glass nodule in the superior right lower lobe, increased, worrisome for synchronous neoplasm.  Multifocal tree-in-bud nodules in the bilateral lower lobes, possibly infectious/ inflammatory.  Small right pleural effusion.  Left supraclavicular, mediastinal, and right hilar nodal metastases.  2.4 cm lesion in the right liver, worrisome for metastasis.   Electronically Signed   By: Julian Hy M.D.   On: 07/29/2014 13:48    ASSESSMENT: CT scan of the chest and abdomen revealed multiple lung nodule and liver nodule. In September 2013 patient had a right breast cancer triple negative disease T1 cN0 M0 tumor status post resection and radiation therapy in no further adjuvant chemotherapy   PLAN:   CT scan has been reviewed independently. Shows multiple [name] of lung mass and liver mass Scan has been reviewed with the patient Possibility of metastases from the breast versus second primary tumor cannot be ruled out Patient has been on Plavix which will be discontinued I discussed this situation with interventional radiology so would decide whether to go ultrasound-guided liver biopsy versus lung biopsy Family was present during discussion PET scan has been ordered for complete staging workup All lab data will be reviewed as soon as is available.  CBC metastases in tumor markers and coagulation profile is been drawn today Patient expressed understanding and was in agreement with this plan. She also understands that She can call clinic  at any time with any questions, concerns, or complaints.    Breast CA   Staging form: Breast, AJCC 7th Edition     Clinical: Stage IA (T1c, N0, M0) - Jo Griffon, MD   08/08/2014 9:47 AM

## 2014-08-09 LAB — CANCER ANTIGEN 27.29: CA 27.29: 12.1 U/mL (ref 0.0–38.6)

## 2014-08-10 ENCOUNTER — Telehealth: Payer: Self-pay | Admitting: *Deleted

## 2014-08-10 ENCOUNTER — Other Ambulatory Visit: Payer: Self-pay | Admitting: *Deleted

## 2014-08-10 DIAGNOSIS — R160 Hepatomegaly, not elsewhere classified: Secondary | ICD-10-CM

## 2014-08-10 NOTE — Telephone Encounter (Signed)
  Oncology Nurse Navigator Documentation    Navigator Encounter Type: Telephone (08/10/14 1300)               Contacted patient to review plan of care and answer any questions. Discussed procedure and preparation for US guided biopsy and PET scan. Will follow. Patient given contact number for further needs or questions.

## 2014-08-11 ENCOUNTER — Other Ambulatory Visit: Payer: Self-pay | Admitting: Radiology

## 2014-08-12 ENCOUNTER — Ambulatory Visit: Admission: RE | Admit: 2014-08-12 | Payer: PPO | Source: Ambulatory Visit

## 2014-08-12 ENCOUNTER — Observation Stay
Admission: AD | Admit: 2014-08-12 | Discharge: 2014-08-13 | Disposition: A | Discharge status: Home / Self Care | Payer: PPO | Source: Ambulatory Visit | Attending: Internal Medicine | Admitting: Internal Medicine

## 2014-08-12 ENCOUNTER — Ambulatory Visit
Admission: RE | Admit: 2014-08-12 | Discharge: 2014-08-12 | Disposition: A | Discharge status: Home / Self Care | Payer: PPO | Source: Ambulatory Visit | Attending: Diagnostic Radiology | Admitting: Diagnostic Radiology

## 2014-08-12 ENCOUNTER — Encounter: Payer: Self-pay | Admitting: Oncology

## 2014-08-12 VITALS — BP 131/54 | HR 93 | Temp 97.7°F | Resp 19 | Ht 61.0 in | Wt 151.0 lb

## 2014-08-12 DIAGNOSIS — R1010 Upper abdominal pain, unspecified: Secondary | ICD-10-CM

## 2014-08-12 DIAGNOSIS — Z8249 Family history of ischemic heart disease and other diseases of the circulatory system: Secondary | ICD-10-CM | POA: Diagnosis not present

## 2014-08-12 DIAGNOSIS — Z886 Allergy status to analgesic agent status: Secondary | ICD-10-CM | POA: Diagnosis not present

## 2014-08-12 DIAGNOSIS — I700 Atherosclerosis of aorta: Secondary | ICD-10-CM | POA: Diagnosis not present

## 2014-08-12 DIAGNOSIS — R52 Pain, unspecified: Secondary | ICD-10-CM

## 2014-08-12 DIAGNOSIS — R160 Hepatomegaly, not elsewhere classified: Secondary | ICD-10-CM | POA: Diagnosis present

## 2014-08-12 DIAGNOSIS — Z853 Personal history of malignant neoplasm of breast: Secondary | ICD-10-CM

## 2014-08-12 DIAGNOSIS — K219 Gastro-esophageal reflux disease without esophagitis: Secondary | ICD-10-CM

## 2014-08-12 DIAGNOSIS — Z8673 Personal history of transient ischemic attack (TIA), and cerebral infarction without residual deficits: Secondary | ICD-10-CM | POA: Insufficient documentation

## 2014-08-12 DIAGNOSIS — Z800 Family history of malignant neoplasm of digestive organs: Secondary | ICD-10-CM

## 2014-08-12 DIAGNOSIS — Y848 Other medical procedures as the cause of abnormal reaction of the patient, or of later complication, without mention of misadventure at the time of the procedure: Secondary | ICD-10-CM | POA: Insufficient documentation

## 2014-08-12 DIAGNOSIS — Z888 Allergy status to other drugs, medicaments and biological substances status: Secondary | ICD-10-CM

## 2014-08-12 DIAGNOSIS — R109 Unspecified abdominal pain: Secondary | ICD-10-CM | POA: Diagnosis present

## 2014-08-12 DIAGNOSIS — K91840 Postprocedural hemorrhage and hematoma of a digestive system organ or structure following a digestive system procedure: Secondary | ICD-10-CM | POA: Insufficient documentation

## 2014-08-12 DIAGNOSIS — N281 Cyst of kidney, acquired: Secondary | ICD-10-CM | POA: Insufficient documentation

## 2014-08-12 DIAGNOSIS — J449 Chronic obstructive pulmonary disease, unspecified: Secondary | ICD-10-CM

## 2014-08-12 DIAGNOSIS — Z79899 Other long term (current) drug therapy: Secondary | ICD-10-CM

## 2014-08-12 DIAGNOSIS — Z90710 Acquired absence of both cervix and uterus: Secondary | ICD-10-CM | POA: Insufficient documentation

## 2014-08-12 DIAGNOSIS — I10 Essential (primary) hypertension: Secondary | ICD-10-CM | POA: Insufficient documentation

## 2014-08-12 DIAGNOSIS — Z87891 Personal history of nicotine dependence: Secondary | ICD-10-CM

## 2014-08-12 DIAGNOSIS — Z823 Family history of stroke: Secondary | ICD-10-CM | POA: Insufficient documentation

## 2014-08-12 DIAGNOSIS — Z9049 Acquired absence of other specified parts of digestive tract: Secondary | ICD-10-CM

## 2014-08-12 DIAGNOSIS — Z7951 Long term (current) use of inhaled steroids: Secondary | ICD-10-CM | POA: Diagnosis not present

## 2014-08-12 DIAGNOSIS — Z87442 Personal history of urinary calculi: Secondary | ICD-10-CM | POA: Diagnosis not present

## 2014-08-12 DIAGNOSIS — Z880 Allergy status to penicillin: Secondary | ICD-10-CM | POA: Insufficient documentation

## 2014-08-12 DIAGNOSIS — R1013 Epigastric pain: Secondary | ICD-10-CM

## 2014-08-12 HISTORY — DX: Chronic obstructive pulmonary disease, unspecified: J44.9

## 2014-08-12 HISTORY — DX: Cerebral infarction, unspecified: I63.9

## 2014-08-12 HISTORY — DX: Gastro-esophageal reflux disease without esophagitis: K21.9

## 2014-08-12 LAB — APTT: aPTT: 26 seconds (ref 24–36)

## 2014-08-12 LAB — CBC
HEMATOCRIT: 33.6 % — AB (ref 35.0–47.0)
HEMOGLOBIN: 10.8 g/dL — AB (ref 12.0–16.0)
MCH: 27.6 pg (ref 26.0–34.0)
MCHC: 32.1 g/dL (ref 32.0–36.0)
MCV: 86.2 fL (ref 80.0–100.0)
Platelets: 303 10*3/uL (ref 150–440)
RBC: 3.9 MIL/uL (ref 3.80–5.20)
RDW: 15.5 % — ABNORMAL HIGH (ref 11.5–14.5)
WBC: 5.9 10*3/uL (ref 3.6–11.0)

## 2014-08-12 LAB — PROTIME-INR
INR: 1.01
PROTHROMBIN TIME: 13.5 s (ref 11.4–15.0)

## 2014-08-12 MED ORDER — HYDROCODONE-ACETAMINOPHEN 5-325 MG PO TABS
1.0000 | ORAL_TABLET | ORAL | Status: DC | PRN
  Administered 2014-08-12 (×2): 2 via ORAL
  Filled 2014-08-12: qty 2

## 2014-08-12 MED ORDER — ONDANSETRON HCL 4 MG/2ML IJ SOLN
4.0000 mg | INTRAMUSCULAR | Status: DC | PRN
  Administered 2014-08-12: 4 mg via INTRAVENOUS
  Filled 2014-08-12: qty 2

## 2014-08-12 MED ORDER — SODIUM CHLORIDE 0.9 % IJ SOLN: 3.0000 mL | INTRAMUSCULAR | Status: DC | PRN

## 2014-08-12 MED ORDER — SENNOSIDES-DOCUSATE SODIUM 8.6-50 MG PO TABS: 1.0000 | ORAL_TABLET | Freq: Every evening | ORAL | Status: DC | PRN

## 2014-08-12 MED ORDER — FENTANYL CITRATE (PF) 100 MCG/2ML IJ SOLN
INTRAMUSCULAR | Status: AC | PRN
  Administered 2014-08-12: 12.5 ug via INTRAVENOUS
  Administered 2014-08-12 (×2): 25 ug via INTRAVENOUS
  Administered 2014-08-12: 12.5 ug via INTRAVENOUS

## 2014-08-12 MED ORDER — BUDESONIDE-FORMOTEROL FUMARATE 160-4.5 MCG/ACT IN AERO
2.0000 | INHALATION_SPRAY | Freq: Two times a day (BID) | RESPIRATORY_TRACT | Status: DC
  Administered 2014-08-12 – 2014-08-13 (×2): 2 via RESPIRATORY_TRACT
  Filled 2014-08-12: qty 6

## 2014-08-12 MED ORDER — HYDROMORPHONE HCL 1 MG/ML IJ SOLN: 1.0000 mg | INTRAMUSCULAR | Status: DC | PRN

## 2014-08-12 MED ORDER — ACETAMINOPHEN 325 MG PO TABS: 650.0000 mg | ORAL_TABLET | Freq: Four times a day (QID) | ORAL | Status: DC | PRN

## 2014-08-12 MED ORDER — SODIUM CHLORIDE 0.9 % IJ SOLN
3.0000 mL | Freq: Two times a day (BID) | INTRAMUSCULAR | Status: DC
  Administered 2014-08-13: 08:00:00 3 mL via INTRAVENOUS

## 2014-08-12 MED ORDER — ALBUTEROL SULFATE (2.5 MG/3ML) 0.083% IN NEBU: 2.5000 mg | INHALATION_SOLUTION | RESPIRATORY_TRACT | Status: DC | PRN

## 2014-08-12 MED ORDER — SODIUM CHLORIDE 0.9 % IV SOLN
Freq: Once | INTRAVENOUS | Status: AC
  Administered 2014-08-12: 10:00:00 via INTRAVENOUS

## 2014-08-12 MED ORDER — HYDROMORPHONE HCL 1 MG/ML IJ SOLN
1.0000 mg | INTRAMUSCULAR | Status: DC | PRN
  Administered 2014-08-12: 1 mg via INTRAVENOUS

## 2014-08-12 MED ORDER — ONDANSETRON HCL 4 MG/2ML IJ SOLN
4.0000 mg | Freq: Once | INTRAMUSCULAR | Status: AC
  Administered 2014-08-12: 4 mg via INTRAVENOUS
  Filled 2014-08-12: qty 2

## 2014-08-12 MED ORDER — SODIUM CHLORIDE 0.9 % IV SOLN: 250.0000 mL | INTRAVENOUS | Status: DC | PRN

## 2014-08-12 MED ORDER — MIDAZOLAM HCL 5 MG/5ML IJ SOLN
INTRAMUSCULAR | Status: AC | PRN
  Administered 2014-08-12 (×3): 0.5 mg via INTRAVENOUS

## 2014-08-12 MED ORDER — PANTOPRAZOLE SODIUM 40 MG PO TBEC
40.0000 mg | DELAYED_RELEASE_TABLET | Freq: Every day | ORAL | Status: DC
Start: 2014-08-12 — End: 2014-08-13
  Administered 2014-08-12 – 2014-08-13 (×2): 40 mg via ORAL
  Filled 2014-08-12 (×2): qty 1

## 2014-08-12 MED ORDER — FENTANYL CITRATE (PF) 100 MCG/2ML IJ SOLN
INTRAMUSCULAR | Status: DC | PRN
  Administered 2014-08-12: 25 ug via INTRAVENOUS

## 2014-08-12 MED ORDER — FENTANYL CITRATE (PF) 100 MCG/2ML IJ SOLN
25.0000 ug | Freq: Once | INTRAMUSCULAR | Status: AC
  Administered 2014-08-12: 25 ug via INTRAVENOUS

## 2014-08-12 MED ORDER — ACETAMINOPHEN 650 MG RE SUPP: 650.0000 mg | Freq: Four times a day (QID) | RECTAL | Status: DC | PRN

## 2014-08-12 MED ORDER — ENALAPRIL MALEATE 10 MG PO TABS
20.0000 mg | ORAL_TABLET | Freq: Every day | ORAL | Status: DC
  Administered 2014-08-12 – 2014-08-13 (×2): 20 mg via ORAL
  Filled 2014-08-12 (×2): qty 2

## 2014-08-12 NOTE — Progress Notes (Signed)
This kind lady post liver biospy with family with continuing pain requiring iv pain meds, evaluated for admission per Dr Darvin Neighbours, for observation overnight, orders pending. Pt had liver biopsy earlier today, coming to spr recovery having increasing and severe pain radiating into back, requiring fentanyl,then dilaudid iv after ct abd. Done. Pt vitals have remained stable however somewhat hypertensive, post past right mastectomy, daughter remains at bedside, had received zofran earlier for severe nausea and is now relieved. sr per monitor, afebrile. Alert and oriented, stating pain still persists but less. Had received norco initially but ineffective. Dr Darvin Neighbours here on consult and pt. To be admitted, orders pending. Resting at present, able to take po's without difficulty.

## 2014-08-12 NOTE — Procedures (Signed)
Discussed procedure with patient and granddaughter as well as risks. Will perform CT-guided biopsy of right hepatic mass.

## 2014-08-12 NOTE — Procedures (Signed)
Under CT guidance, core biopsy was performed of lesion in superior portion of right hepatic lobe. No immediate complications.

## 2014-08-12 NOTE — H&P (Signed)
[facility] [place] NAME: Jo Frank    MR#:  [id number]  DATE OF BIRTH:  12-06-1928  DATE OF ADMISSION:  08/12/2014  PRIMARY CARE PHYSICIAN: [name], MD   REQUESTING/REFERRING PHYSICIAN: Dr Nyoka Cowden - Radiology  CHIEF COMPLAINT:  No chief complaint on file.  Abdominal pain HISTORY OF PRESENT ILLNESS:  Jo Frank  is a 81 y.o. female with a known history of HTN, CVA, GERD, Breast cancer was recently found to have a liver nodule on CT scan and later on PET scan. Patient was brought to the hospital for a liver biopsy by interventional radiology. She had significant pain not controlled by oral or IV pain medications. Had a CT scan done today which showed a subcapsular hematoma at the biopsy site . Patient is being admitted for intractable pain. Some nausea.   No similar pain in the past.  PAST MEDICAL HISTORY:   Past Medical History  Diagnosis Date  . Hypertension   . CVA (cerebral infarction) 2010    Was on Plavix for 2 years, then switched to clopidogrel but d/c due to side effects ("sick")  . Nephrolithiasis     per patient, passes on her own  . Stroke   . COPD (chronic obstructive pulmonary disease)   . GERD (gastroesophageal reflux disease)   . Breast cancer 2013    breast cancer right-radiation-lumpectomy    PAST SURGICAL HISTORY:   Past Surgical History  Procedure Laterality Date  . Breast lumpectomy      left side  . Abdominal hysterectomy  1960s    "bleeding profusely"  . Cholecystectomy    . Hand surgery      s/p fall    SOCIAL HISTORY:   History  Substance Use Topics  . Smoking status: Former Smoker -- 0.50 packs/day for 30 years    Types: Cigarettes    Quit date: 12/07/1978  . Smokeless tobacco: Never Used  . Alcohol Use: No    FAMILY HISTORY:   Family History  Problem Relation Age of Onset  . Throat cancer Mother     heavy smoker & drinker, died at 79 yo (1978)  . Heart failure  Father   . Hypertension Sister   . Stroke Sister     DRUG ALLERGIES:   Allergies  Allergen Reactions  . Aspirin Other (See Comments)    Vomits blood Vomits blood  . Nitrofuran Derivatives Nausea And Vomiting    Unspecified  . Penicillins Swelling and Other (See Comments)    Throat swells Tongue and throat swelled closed    REVIEW OF SYSTEMS:   Review of Systems  Constitutional: Positive for malaise/fatigue. Negative for fever, chills and weight loss.  HENT: Negative for hearing loss and nosebleeds.   Eyes: Negative for blurred vision, double vision and pain.  Respiratory: Negative for cough, hemoptysis, sputum production, shortness of breath and wheezing.   Cardiovascular: Negative for chest pain, palpitations, orthopnea and leg swelling.  Gastrointestinal: Positive for nausea and abdominal pain. Negative for vomiting, diarrhea and constipation.  Genitourinary: Negative for dysuria and hematuria.  Musculoskeletal: Negative for myalgias, back pain and falls.  Skin: Negative for rash.  Neurological: Negative for dizziness, tremors, sensory change, speech change, focal weakness, seizures and headaches.  Endo/Heme/Allergies: Does not bruise/bleed easily.  Psychiatric/Behavioral: Negative for depression and memory loss. The patient is not nervous/anxious.     MEDICATIONS AT HOME:   Prior to Admission medications   Medication Sig Start Date End Date  Taking? Authorizing Provider  acetaminophen (TYLENOL) 500 MG tablet Take 1,000 mg by mouth at bedtime as needed (pain).   Yes Historical Provider, MD  albuterol (PROAIR HFA) 108 (90 BASE) MCG/ACT inhaler Inhale into the lungs.   Yes Historical Provider, MD  Cyanocobalamin (VITAMIN B-12 PO) Take 1 tablet by mouth daily. chewable   Yes Historical Provider, MD  esomeprazole (NEXIUM) 20 MG capsule Take 20 mg by mouth daily at 12 noon.   Yes Historical Provider, MD  Oxymetazoline HCl ([employer] NASAL SPRAY NA) Place 1 spray into the nose at  bedtime.   Yes Historical Provider, MD  clopidogrel (PLAVIX) 75 MG tablet Take 1 tablet (75 mg total) by mouth daily with breakfast. 12/07/12   Juluis Mire, MD  enalapril (VASOTEC) 20 MG tablet Take 20 mg by mouth daily.  10/29/12   Historical Provider, MD  triamcinolone ointment (KENALOG) 0.1 % Apply 1 application topically at bedtime as needed (rash/itching).  11/12/12   Historical Provider, MD      VITAL SIGNS:  Blood pressure 159/80, pulse 95, temperature 98.3 F (36.8 C), temperature source Oral, resp. rate 18, height '5\' 1"'$  (1.549 m), weight 68.493 kg (151 lb), SpO2 97 %.  PHYSICAL EXAMINATION:  Physical Exam  GENERAL:  81 y.o.-year-old patient lying in the bed with no acute distress.  EYES: Pupils equal, round, reactive to light and accommodation. No scleral icterus. Extraocular muscles intact.  HEENT: Head atraumatic, normocephalic. Oropharynx and nasopharynx clear. No oropharyngeal erythema, moist oral mucosa  NECK:  Supple, no jugular venous distention. No thyroid enlargement, no tenderness.  LUNGS: Normal breath sounds bilaterally, no wheezing, rales, rhonchi. No use of accessory muscles of respiration.  CARDIOVASCULAR: S1, S2 normal. No murmurs, rubs, or gallops.  ABDOMEN: Soft, nondistended. Bowel sounds present. No organomegaly or mass. Epigastric and RUQ tenderness. NO bleeding/swelling/redness. EXTREMITIES: No pedal edema, cyanosis, or clubbing. + 2 pedal & radial pulses b/l.   NEUROLOGIC: Cranial nerves II through XII are intact. No focal Motor or sensory deficits appreciated b/l PSYCHIATRIC: The patient is alert and oriented x 3. Good affect.  SKIN: No obvious rash, lesion, or ulcer.   LABORATORY PANEL:   CBC  Recent Labs Lab 08/12/14 0901  WBC 5.9  HGB 10.8*  HCT 33.6*  PLT 303   ------------------------------------------------------------------------------------------------------------------  Chemistries   Recent Labs Lab 08/08/14 0921  NA 134*  K  4.2  CL 102  CO2 26  GLUCOSE 96  BUN 23*  CREATININE 1.22*  CALCIUM 8.7*  AST 17  ALT 14  ALKPHOS 70  BILITOT 0.7   ------------------------------------------------------------------------------------------------------------------  Cardiac Enzymes No results for input(s): TROPONINI in the last 168 hours. ------------------------------------------------------------------------------------------------------------------  RADIOLOGY:  Ct Abdomen Wo Contrast  08/12/2014   CLINICAL DATA:  Acute upper abdominal pain after hepatic biopsy.  EXAM: CT ABDOMEN WITHOUT CONTRAST  TECHNIQUE: Multidetector CT imaging of the abdomen was performed following the standard protocol without IV contrast.  COMPARISON:  CT scan of same day.  FINDINGS: Status post cholecystectomy. No pneumothorax is noted in visualized lung bases. Hypodense mass is noted in right hepatic lobe with Gel-Foam surrounding it consistent with recent percutaneous biopsy. There does appear to be a small subcapsular hematoma seen anteriorly over the right hepatic lobe. No other hemorrhage is noted. Atherosclerosis of abdominal aorta is noted without aneurysm formation. Large left renal cyst is again noted. The spleen and pancreas appear normal. Right renal cysts are stable. There is no evidence of bowel obstruction.  IMPRESSION: Right hepatic lesion is again  noted which was the object of biopsy today. Gel-Foam is noted in and surrounding the lesion. This is expected finding status post biopsy. Small subcapsular hematoma is seen along the anterior portion of the right hepatic lobe.   Electronically Signed   By: Marijo Conception, M.D.   On: 08/12/2014 12:59   Ct Biopsy  08/12/2014   CLINICAL DATA:  Right hepatic mass.  EXAM: CT GUIDED core BIOPSY OF right hepatic lobe mass.  ANESTHESIA/SEDATION: 1.5  Mg IV Versed; 100 mcg IV Fentanyl  Total Moderate Sedation Time: 40 minutes.  PROCEDURE: The procedure risks, benefits, and alternatives were  explained to the patient, including pneumothorax, bleeding and infection. Questions regarding the procedure were encouraged and answered. The patient understands and consents to the procedure.  The right upper quadrant of the abdomen was prepped with chlorhexidinein a sterile fashion, and a sterile drape was applied covering the operative field. Sterile gloves were used for the procedure. Local anesthesia was provided with 1% Lidocaine.  Under CT guidance, 17 gauge guiding needle was directed toward lesion in superior and posterior portion of right hepatic lobe. Four core samples were obtained using 18 gauge biopsy needle. Needle was then removed with the simultaneous injection of Gel-Foam slurry. Appropriate dressing was applied.  Complications: None immediate noted on post biopsy imaging.  FINDINGS: Right hepatic mass as described on prior studies.  IMPRESSION: Under CT guidance, percutaneous core biopsy was performed of right hepatic lobe lesion concerning for metastasis.   Electronically Signed   By: Marijo Conception, M.D.   On: 08/12/2014 11:56     IMPRESSION AND PLAN:   * Abdominal pain due to subcapsular hematoma s/p liver biopsy Pain control with PO and IV meds. Admit under observation. IR to follow patient while in hospital for biopsy complication.  * HTN Home meds  * COPD Continue inhalers and PRN nebs.  * Breast cancer with possible liver mets S/p Biopsy. F/u with Dr. Oliva Bustard as OP  * DVT prophylaxis SCDs  All the records are reviewed and case discussed with ED provider. Management plans discussed with the patient, family and they are in agreement.  CODE STATUS: FULL CODE  TOTAL TIME TAKING CARE OF THIS PATIENT: 40 minutes.    Hillary Bow R M.D on 08/12/2014 at 4:35 PM  Between 7am to 6pm - Pager - [phone]  After 6pm go to [email or web address] - password [email or web address] [facility]  Office  [phone]  CC: Primary care physician; [name],  MD

## 2014-08-13 DIAGNOSIS — K91840 Postprocedural hemorrhage and hematoma of a digestive system organ or structure following a digestive system procedure: Secondary | ICD-10-CM | POA: Diagnosis not present

## 2014-08-13 NOTE — Plan of Care (Signed)
Problem: Discharge Progression Outcomes Goal: Other Discharge Outcomes/Goals Plan of care progress to goal: Pain: no medication given this shift Hemodynamically: VSS Diet: minimal intake Activity:up with one assist

## 2014-08-13 NOTE — Discharge Instructions (Signed)
Abdominal Pain, Women °Abdominal (stomach, pelvic, or belly) pain can be caused by many things. It is important to tell your doctor: °· The location of the pain. °· Does it come and go or is it present all the time? °· Are there things that start the pain (eating certain foods, exercise)? °· Are there other symptoms associated with the pain (fever, nausea, vomiting, diarrhea)? °All of this is helpful to know when trying to find the cause of the pain. °CAUSES  °· Stomach: virus or bacteria infection, or ulcer. °· Intestine: appendicitis (inflamed appendix), regional ileitis (Crohn's disease), ulcerative colitis (inflamed colon), irritable bowel syndrome, diverticulitis (inflamed diverticulum of the colon), or cancer of the stomach or intestine. °· Gallbladder disease or stones in the gallbladder. °· Kidney disease, kidney stones, or infection. °· Pancreas infection or cancer. °· Fibromyalgia (pain disorder). °· Diseases of the female organs: °¨ Uterus: fibroid (non-cancerous) tumors or infection. °¨ Fallopian tubes: infection or tubal pregnancy. °¨ Ovary: cysts or tumors. °¨ Pelvic adhesions (scar tissue). °¨ Endometriosis (uterus lining tissue growing in the pelvis and on the pelvic organs). °¨ Pelvic congestion syndrome (female organs filling up with blood just before the menstrual period). °¨ Pain with the menstrual period. °¨ Pain with ovulation (producing an egg). °¨ Pain with an IUD (intrauterine device, birth control) in the uterus. °¨ Cancer of the female organs. °· Functional pain (pain not caused by a disease, may improve without treatment). °· Psychological pain. °· Depression. °DIAGNOSIS  °Your doctor will decide the seriousness of your pain by doing an examination. °· Blood tests. °· X-rays. °· Ultrasound. °· CT scan (computed tomography, special type of X-ray). °· MRI (magnetic resonance imaging). °· Cultures, for infection. °· Barium enema (dye inserted in the large intestine, to better view it with  X-rays). °· Colonoscopy (looking in intestine with a lighted tube). °· Laparoscopy (minor surgery, looking in abdomen with a lighted tube). °· Major abdominal exploratory surgery (looking in abdomen with a large incision). °TREATMENT  °The treatment will depend on the cause of the pain.  °· Many cases can be observed and treated at home. °· Over-the-counter medicines recommended by your caregiver. °· Prescription medicine. °· Antibiotics, for infection. °· Birth control pills, for painful periods or for ovulation pain. °· Hormone treatment, for endometriosis. °· Nerve blocking injections. °· Physical therapy. °· Antidepressants. °· Counseling with a psychologist or psychiatrist. °· Minor or major surgery. °HOME CARE INSTRUCTIONS  °· Do not take laxatives, unless directed by your caregiver. °· Take over-the-counter pain medicine only if ordered by your caregiver. Do not take aspirin because it can cause an upset stomach or bleeding. °· Try a clear liquid diet (broth or water) as ordered by your caregiver. Slowly move to a bland diet, as tolerated, if the pain is related to the stomach or intestine. °· Have a thermometer and take your temperature several times a day, and record it. °· Bed rest and sleep, if it helps the pain. °· Avoid sexual intercourse, if it causes pain. °· Avoid stressful situations. °· Keep your follow-up appointments and tests, as your caregiver orders. °· If the pain does not go away with medicine or surgery, you may try: °¨ Acupuncture. °¨ Relaxation exercises (yoga, meditation). °¨ Group therapy. °¨ Counseling. °SEEK MEDICAL CARE IF:  °· You notice certain foods cause stomach pain. °· Your home care treatment is not helping your pain. °· You need stronger pain medicine. °· You want your IUD removed. °· You feel faint or   lightheaded. °· You develop nausea and vomiting. °· You develop a rash. °· You are having side effects or an allergy to your medicine. °SEEK IMMEDIATE MEDICAL CARE IF:  °· Your  pain does not go away or gets worse. °· You have a fever. °· Your pain is felt only in portions of the abdomen. The right side could possibly be appendicitis. The left lower portion of the abdomen could be colitis or diverticulitis. °· You are passing blood in your stools (bright red or black tarry stools, with or without vomiting). °· You have blood in your urine. °· You develop chills, with or without a fever. °· You pass out. °MAKE SURE YOU:  °· Understand these instructions. °· Will watch your condition. °· Will get help right away if you are not doing well or get worse. °Document Released: 11/11/2006 Document Revised: 05/31/2013 Document Reviewed: 12/01/2008 °[employer]® Patient Information ©2015 [employer]. This information is not intended to replace advice given to you by your health care provider. Make sure you discuss any questions you have with your health care provider. ° °

## 2014-08-15 ENCOUNTER — Ambulatory Visit
Admission: AD | Admit: 2014-08-15 | Discharge: 2014-08-15 | Disposition: A | Discharge status: Home / Self Care | Payer: PPO | Source: Ambulatory Visit | Attending: Internal Medicine | Admitting: Internal Medicine

## 2014-08-15 DIAGNOSIS — K91840 Postprocedural hemorrhage and hematoma of a digestive system organ or structure following a digestive system procedure: Secondary | ICD-10-CM | POA: Diagnosis not present

## 2014-08-15 DIAGNOSIS — C50919 Malignant neoplasm of unspecified site of unspecified female breast: Secondary | ICD-10-CM

## 2014-08-15 DIAGNOSIS — R160 Hepatomegaly, not elsewhere classified: Secondary | ICD-10-CM

## 2014-08-15 LAB — GLUCOSE, CAPILLARY: GLUCOSE-CAPILLARY: 79 mg/dL (ref 65–99)

## 2014-08-15 MED ORDER — FLUDEOXYGLUCOSE F - 18 (FDG) INJECTION
12.4200 | Freq: Once | INTRAVENOUS | Status: AC | PRN
  Administered 2014-08-15: 12.42 via INTRAVENOUS

## 2014-08-17 ENCOUNTER — Inpatient Hospital Stay (HOSPITAL_BASED_OUTPATIENT_CLINIC_OR_DEPARTMENT_OTHER): Payer: PPO | Admitting: Oncology

## 2014-08-17 ENCOUNTER — Encounter: Payer: Self-pay | Admitting: Oncology

## 2014-08-17 VITALS — BP 180/90 | HR 120 | Temp 99.9°F | Wt 150.1 lb

## 2014-08-17 DIAGNOSIS — Z9049 Acquired absence of other specified parts of digestive tract: Secondary | ICD-10-CM

## 2014-08-17 DIAGNOSIS — Z87891 Personal history of nicotine dependence: Secondary | ICD-10-CM

## 2014-08-17 DIAGNOSIS — Z90710 Acquired absence of both cervix and uterus: Secondary | ICD-10-CM

## 2014-08-17 DIAGNOSIS — Z8673 Personal history of transient ischemic attack (TIA), and cerebral infarction without residual deficits: Secondary | ICD-10-CM

## 2014-08-17 DIAGNOSIS — N281 Cyst of kidney, acquired: Secondary | ICD-10-CM

## 2014-08-17 DIAGNOSIS — R531 Weakness: Secondary | ICD-10-CM

## 2014-08-17 DIAGNOSIS — R918 Other nonspecific abnormal finding of lung field: Secondary | ICD-10-CM

## 2014-08-17 DIAGNOSIS — Z923 Personal history of irradiation: Secondary | ICD-10-CM

## 2014-08-17 DIAGNOSIS — R5383 Other fatigue: Secondary | ICD-10-CM

## 2014-08-17 DIAGNOSIS — R160 Hepatomegaly, not elsewhere classified: Secondary | ICD-10-CM

## 2014-08-17 DIAGNOSIS — Z9011 Acquired absence of right breast and nipple: Secondary | ICD-10-CM

## 2014-08-17 DIAGNOSIS — I2510 Atherosclerotic heart disease of native coronary artery without angina pectoris: Secondary | ICD-10-CM

## 2014-08-17 DIAGNOSIS — Z853 Personal history of malignant neoplasm of breast: Secondary | ICD-10-CM

## 2014-08-17 DIAGNOSIS — Z809 Family history of malignant neoplasm, unspecified: Secondary | ICD-10-CM

## 2014-08-17 DIAGNOSIS — Z87442 Personal history of urinary calculi: Secondary | ICD-10-CM

## 2014-08-17 DIAGNOSIS — R05 Cough: Secondary | ICD-10-CM | POA: Diagnosis not present

## 2014-08-17 DIAGNOSIS — I10 Essential (primary) hypertension: Secondary | ICD-10-CM

## 2014-08-17 MED ORDER — PREDNISONE 10 MG PO TABS: ORAL_TABLET | ORAL | Status: DC

## 2014-08-17 MED ORDER — BENZONATATE 100 MG PO CAPS: 100.0000 mg | ORAL_CAPSULE | Freq: Three times a day (TID) | ORAL | Status: DC

## 2014-08-17 NOTE — Progress Notes (Signed)
Patient does have living will.  Former smoker.  Patient has had a cold.  Temp 99.9  BP 180/90.  HR 120

## 2014-08-17 NOTE — Progress Notes (Signed)
[place] @ [facility] ([facility]) [phone]  Fax:([phone]) [place] OB: 01/08/1929  MR#: [id number]  JSH#:[id number]  Patient Care Team: [name], MD as PCP - General (Internal Medicine)  CHIEF COMPLAINT:  Chief Complaint  Patient presents with  . Follow-up    VISIT DIAGNOSIS:     ICD-9-CM ICD-10-CM   1. Lung mass 786.6 R91.8 predniSONE (DELTASONE) 10 MG tablet     benzonatate (TESSALON) 100 MG capsule     Oncology History   1.  Diagnosis of right breast cancer T1 cN0 M0 tumor triple negative disease status post partial mastectomy in September 2013.  Patient underwent lumpectomy radiation therapy there is no record of evaluation by oncologist.  2pT1c pN0 (sn) cM0 triple negative grade 2 invasive carcinoma of the right breast lumpectomy and sentinel node study on 10/11/2011. Tumor size 1.8 cm, grade 2, margins uninvolved by invasive carcinoma.  DCIS present, closest margin is 5 mm. 2 sentinel lymph nodes negative. ER negative (0%), PR negative (0%), HER-2/neu FISH negative (HER2/CEP17 ratio 1.16). 3.  CT scan of the chest revealed multiple lung nodules and the one nodule in July of      Breast CA   10/09/2011 Initial Diagnosis Breast CA stage IC triple negative disease status post lumpectomy and radiation therapy    Oncology Flowsheet 12/06/2012 08/12/2014 08/12/2014  ondansetron (ZOFRAN) IV 4 mg 4 mg 4 mg    INTERVAL HISTORY:  81 year old lady started feeling weak and tired.  Was evaluated by primary care physician initially she received a course of prednisone and antibiotic without much relief than patient was evaluated had a chest x-ray followed by CT scan.  CT scan revealed right upper lobe lung mass multiple lung nodules and a liver nodule.  Patient had a previous history of node-negative carcinoma of breast. Feeling weak and tired.  No hemoptysis no chest pain no bony pains. August 17, 2014 Patient is here after getting PET scan done.   Patient also had a liver biopsy.  Following liver biopsy patient has significant amount of pain and required overnight observation for pain control.  Pain is resolved now.  Biopsy result not available.  I discussed situation with pathologist.  They are waiting for final staining result PET scan has been reviewed. Patient continues to feel weak tired dry hacking cough poor appetite REVIEW OF SYSTEMS:    general status: Patient is feeling weak and tired.  No change in a performance status.  No chills.  No fever.  HEENT  No evidence of stomatitis Lungs: Dry hacking cough Cardiac: No chest pain or paroxysmal nocturnal dyspnea GI: No nausea no vomiting no diarrhea no abdominal pain Poor appetite Skin: No rash Lower extremity no swelling Neurological system: No tingling.  No numbness.  No other focal signs Musculoskeletal system no bony pains  GU: No hematuria.  No dysuria. Extremely depressed.  As per HPI. Otherwise, a complete review of systems is negatve.  PAST MEDICAL HISTORY: Past Medical History  Diagnosis Date  . Hypertension   . CVA (cerebral infarction) 2010    Was on Plavix for 2 years, then switched to clopidogrel but d/c due to side effects ("sick")  . Nephrolithiasis     per patient, passes on her own  . Stroke   . COPD (chronic obstructive pulmonary disease)   . GERD (gastroesophageal reflux disease)   . Breast cancer 2013    breast cancer right-radiation-lumpectomy    PAST SURGICAL HISTORY: Past Surgical History  Procedure Laterality Date  . Breast lumpectomy      left side  . Abdominal hysterectomy  1960s    "bleeding profusely"  . Cholecystectomy    . Hand surgery      s/p fall    FAMILY HISTORY Family History  Problem Relation Age of Onset  . Throat cancer Mother     heavy smoker & drinker, died at 79 yo (1978)  . Heart failure Father   . Hypertension Sister   . Stroke Sister          ADVANCED DIRECTIVES:  Patient does have advance healthcare  directive, Patient   does not desire to make any changes  HEALTH MAINTENANCE: History  Substance Use Topics  . Smoking status: Former Smoker -- 0.50 packs/day for 30 years    Types: Cigarettes    Quit date: 12/07/1978  . Smokeless tobacco: Never Used  . Alcohol Use: No     Allergies  Allergen Reactions  . Penicillins Anaphylaxis  . Aspirin Other (See Comments)    Pt states that she vomits blood.   [place] Spikes Derivatives Nausea And Vomiting    Current Outpatient Prescriptions  Medication Sig Dispense Refill  . acetaminophen (TYLENOL) 500 MG tablet Take 1,000 mg by mouth every 6 (six) hours as needed for mild pain or headache.     . albuterol (PROVENTIL HFA;VENTOLIN HFA) 108 (90 BASE) MCG/ACT inhaler Inhale 1-2 puffs into the lungs every 6 (six) hours as needed for wheezing or shortness of breath.    . budesonide-formoterol (SYMBICORT) 160-4.5 MCG/ACT inhaler Inhale 2 puffs into the lungs 2 (two) times daily.    . clopidogrel (PLAVIX) 75 MG tablet Take 1 tablet (75 mg total) by mouth daily with breakfast. 90 tablet 0  . Cyanocobalamin (VITAMIN B-12 PO) Take 1 tablet by mouth daily.     . enalapril (VASOTEC) 20 MG tablet Take 20 mg by mouth daily.    Marland Kitchen esomeprazole (NEXIUM) 20 MG capsule Take 20-40 mg by mouth daily as needed (for indigestion/heartburn).     . Oxymetazoline HCl ([employer] NASAL SPRAY NA) Place 1 spray into the nose at bedtime as needed (for congestion).     . benzonatate (TESSALON) 100 MG capsule Take 1 capsule (100 mg total) by mouth 3 (three) times daily. 30 capsule 0  . predniSONE (DELTASONE) 10 MG tablet 5 tabs po x 1 day, 4 tabs po x 1 day, 3 tabs po x 1 day, 2 tabs po x 1 day, 1 tab po x 1 day, then stop 15 tablet 0   No current facility-administered medications for this visit.    OBJECTIVE: PHYSICAL EXAM:has not lost sig General  status: Performance status is good.  Patient is feeling weak and tired.  Poor appetite.  Very depressed. HEENT: No evidence of  stomatitis. Sclera and conjunctivae :: No jaundice.   pale looking. Lungs: Air  entry equal on both sides.  No rhonchi.  No rales.  Cardiac: Heart sounds are normal.  No pericardial rub.  No murmur. Lymphatic system: Cervical, axillary, inguinal, lymph nodes not palpable GI: Abdomen is soft.  No ascites.  Liver spleen not palpable.  No tenderness.  Bowel sounds are within normal limit Lower extremity: No edema Neurological system: Higher functions, cranial nerves intact no evidence of peripheral neuropathy. Skin: No rash.  No ecchymosis.Danley Danker Vitals:   08/17/14 1210  BP: 180/90  Pulse: 120  Temp: 99.9 F (37.7 C)     Body mass index is 28.38  kg/(m^2).    ECOG FS:1 - Symptomatic but completely ambulatory  LAB RESULTS:  No visits with results within 2 Day(s) from this visit. Latest known visit with results is:  Hospital Outpatient Visit on 08/15/2014  Component Date Value Ref Range Status  . Glucose-Capillary 08/15/2014 79  65 - 99 mg/dL Final     STUDIES: Ct Abdomen Wo Contrast  Sep 08, 2014   CLINICAL DATA:  Acute upper abdominal pain after hepatic biopsy.  EXAM: CT ABDOMEN WITHOUT CONTRAST  TECHNIQUE: Multidetector CT imaging of the abdomen was performed following the standard protocol without IV contrast.  COMPARISON:  CT scan of same day.  FINDINGS: Status post cholecystectomy. No pneumothorax is noted in visualized lung bases. Hypodense mass is noted in right hepatic lobe with Gel-Foam surrounding it consistent with recent percutaneous biopsy. There does appear to be a small subcapsular hematoma seen anteriorly over the right hepatic lobe. No other hemorrhage is noted. Atherosclerosis of abdominal aorta is noted without aneurysm formation. Large left renal cyst is again noted. The spleen and pancreas appear normal. Right renal cysts are stable. There is no evidence of bowel obstruction.  IMPRESSION: Right hepatic lesion is again noted which was the object of biopsy today. Gel-Foam  is noted in and surrounding the lesion. This is expected finding status post biopsy. Small subcapsular hematoma is seen along the anterior portion of the right hepatic lobe.   Electronically Signed   By: Marijo Conception, M.D.   On: 2014/09/08 12:59   Ct Chest W Contrast  07/29/2014   CLINICAL DATA:  Right upper lobe lung mass.  EXAM: CT CHEST WITH CONTRAST  TECHNIQUE: Multidetector CT imaging of the chest was performed during intravenous contrast administration.  CONTRAST:  48m OMNIPAQUE IOHEXOL 300 MG/ML  SOLN  COMPARISON:  CT chest dated 05/31/2010  FINDINGS: Mediastinum/Nodes: Heart is normal in size. No pericardial effusion.  Coronary atherosclerosis.  Atherosclerotic calcifications of the aortic arch.  Thoracic lymphadenopathy, including:  --10 mm short axis left supraclavicular node (series 2/ image 5)  --12 mm short axis right paratracheal node (series 2/ image 18)  --13 mm short axis low right paratracheal node (series 2/ image 22)  --11 mm short axis subcarinal node (series 2/ image 24)  --13 mm short axis right hilar node (series 2/ image 24)  --14 mm short axis azygoesophageal recess node (series 2/image 20)  Lungs/Pleura: 3.0 x 2.9 x 2.9 cm spiculated right upper lobe mass (series 3/image 18), corresponding to the previous 2.1 x 1.7 cm subsolid/ground-glass nodule, worrisome for primary bronchogenic neoplasm. Associated pleural involvement anteriorly.  Adjacent satellite nodularity (series 3/ image 18). Additional 2.2 cm sub solid/ ground-glass nodule in the superior right lower lobe (series 3/ image 22), previously reflecting a 1.5 cm ground-glass nodule, worrisome for additional focus of neoplasm.  Multifocal tree-in-bud nodules in the bilateral lower lobes (series 3/ image 39), possibly infectious/ inflammatory.  Underlying mild emphysematous changes.  Radiation changes in the anterior right upper lobe.  Small right pleural effusion.  No pneumothorax.  Upper abdomen: Visualized upper abdomen is  notable for a 2.4 cm lesion in the right liver (series 2/ image 48), worrisome for metastasis.  Additionally present is a 7 mm nonobstructing right renal calculus (series 2/ image 49), an 11.6 cm left renal cyst (series 2/ image 54), small right renal cysts, and vascular calcifications.  Musculoskeletal: Degenerative changes of the visualized thoracolumbar spine.  IMPRESSION: 3.0 cm spiculated right upper lobe mass, increased, worrisome for primary bronchogenic neoplasm. Associated  pleural/anterior chest wall involvement. Additional right upper lobe satellite nodularity.  2.2 cm subsolid/ground-glass nodule in the superior right lower lobe, increased, worrisome for synchronous neoplasm.  Multifocal tree-in-bud nodules in the bilateral lower lobes, possibly infectious/ inflammatory.  Small right pleural effusion.  Left supraclavicular, mediastinal, and right hilar nodal metastases.  2.4 cm lesion in the right liver, worrisome for metastasis.   Electronically Signed   By: Julian Hy M.D.   On: 07/29/2014 13:48   Nm Pet Image Initial (pi) Skull Base To Thigh  08/15/2014   CLINICAL DATA:  Subsequent treatment strategy for liver lesion biopsied on 08/12/2014. History of right breast cancer in 2013 with last radiation therapy in 2014.  EXAM: NUCLEAR MEDICINE PET SKULL BASE TO THIGH  TECHNIQUE: 12.42 mCi F-18 FDG was injected intravenously. Full-ring PET imaging was performed from the skull base to thigh after the radiotracer. CT data was obtained and used for attenuation correction and anatomic localization.  FASTING BLOOD GLUCOSE:  Value: 79 mg/dl  COMPARISON:  Abdominal CT 08/12/2014.  Chest CT 07/29/2014.  FINDINGS: NECK  There are 2 adjacent left supraclavicular lymph nodes which are hypermetabolic. The larger of these measures 10 mm short axis on image 66 and has an SUV max of 4.1. No other hypermetabolic cervical lymph nodes demonstrated.There are no lesions of the pharyngeal mucosal space.  CHEST  The  enlarged right paratracheal and right hilar lymph nodes on recent CT are hypermetabolic. The largest right paratracheal node measures 1.7 cm short axis on image 90 and has an SUV max of 10.0. No hypermetabolic subcarinal, left hilar or axillary adenopathy. The dominant spiculated right upper lobe mass is now partly obscured by atelectasis, but measures approximately 3.3 x 3.1 cm on image 84 and is hypermetabolic with an SUV max of 15.9. The other smaller ground-glass opacities in the right upper lobe (image 84) and in the superior segment of the right lower lobe (image 96) also demonstrate low-level hypermetabolic activity (SUV max 2.1). There is a 9 mm focal ground-glass opacity in the left upper lobe on image 84 which also demonstrates low-level activity. There is increased patchy airspace opacity dependently in the right lower lobe with associated low level metabolic activity, likely inflammatory.  ABDOMEN/PELVIS  The recently biopsied dominant lesion in the right hepatic lobe is hypermetabolic. This lesion measures approximately 3.5 cm on image 128 and has an SUV max of 10.6. There are at least 2 smaller adjacent hypermetabolic metastases within the right hepatic lobe. There is no suspicious activity within the spleen, pancreas, adrenal glands or kidneys. There is low-level hypermetabolic activity within a single lymph node in the porta hepatis (SUV max 4.0). There is no other hypermetabolic nodal activity. There are low-density adnexal lesions bilaterally status post hysterectomy without associated abnormal metabolic activity. There are multiple low-density renal lesions, including a dominant left renal cyst without suspicious metabolic activity. Nonobstructing calculus noted in the upper pole of the right kidney, diffuse atherosclerosis and sigmoid diverticulosis noted.  SKELETON  There is a single hypermetabolic osseous metastasis within the right ischium. This is lytic and has an SUV max of 10.2. Low-level  activity anteriorly at C1-2 is likely inflammatory. There is a moderate thoracolumbar scoliosis.  IMPRESSION: 1. The dominant right upper lobe pulmonary mass is hypermetabolic, most consistent with bronchogenic carcinoma. There are associated left supraclavicular, right hilar and mediastinal nodal metastases, at least 3 hepatic metastases, and a right ischial nodal metastasis. 2. There are additional ground-glass opacities in both lungs which may represent  synchronous adenocarcinoma. 3. Increased patchy dependent opacity in the right lower lobe, likely inflammatory.   Electronically Signed   By: Richardean Sale M.D.   On: 08/15/2014 12:23   Ct Biopsy  08/12/2014   CLINICAL DATA:  Right hepatic mass.  EXAM: CT GUIDED core BIOPSY OF right hepatic lobe mass.  ANESTHESIA/SEDATION: 1.5  Mg IV Versed; 100 mcg IV Fentanyl  Total Moderate Sedation Time: 40 minutes.  PROCEDURE: The procedure risks, benefits, and alternatives were explained to the patient, including pneumothorax, bleeding and infection. Questions regarding the procedure were encouraged and answered. The patient understands and consents to the procedure.  The right upper quadrant of the abdomen was prepped with chlorhexidinein a sterile fashion, and a sterile drape was applied covering the operative field. Sterile gloves were used for the procedure. Local anesthesia was provided with 1% Lidocaine.  Under CT guidance, 17 gauge guiding needle was directed toward lesion in superior and posterior portion of right hepatic lobe. Four core samples were obtained using 18 gauge biopsy needle. Needle was then removed with the simultaneous injection of Gel-Foam slurry. Appropriate dressing was applied.  Complications: None immediate noted on post biopsy imaging.  FINDINGS: Right hepatic mass as described on prior studies.  IMPRESSION: Under CT guidance, percutaneous core biopsy was performed of right hepatic lobe lesion concerning for metastasis.   Electronically  Signed   By: Marijo Conception, M.D.   On: 08/12/2014 11:56    ASSESSMENT: CT scan of the chest and abdomen revealed multiple lung nodule and liver nodule. In September 2013 patient had a right breast cancer triple negative disease T1 cN0 M0 tumor status post resection and radiation therapy in no further adjuvant chemotherapy PET scan has been reviewed Right upper lobe lung mass mediastinal adenopathy and multiple liver metastases Biopsy of liver mass is pending Discussed situation with pathologist there waiting for final staining report to come back  PLAN:   PET scan has been reviewed with the patient. Family had number of questions which were answered. Patient was started on prednisone and Tessalon Perles for symptomatic treatment of dry hacking cough Guidant 360 to has been ordered for molecular marker assessment Total duration of visit was 35 minutes.  50% or more time was spent in counseling patient and family regarding prognosis and options of treatment and available resources Patient expressed understanding and was in agreement with this plan. She also understands that She can call clinic at any time with any questions, concerns, or complaints.    Breast CA   Staging form: Breast, AJCC 7th Edition     Clinical: Stage IA (T1c, N0, M0) - Marni Griffon, MD   08/17/2014 5:31 PM

## 2014-08-18 ENCOUNTER — Ambulatory Visit
Admission: RE | Admit: 2014-08-18 | Discharge: 2014-08-18 | Disposition: A | Discharge status: Home / Self Care | Payer: PPO | Source: Ambulatory Visit | Attending: Oncology | Admitting: Oncology

## 2014-08-18 ENCOUNTER — Inpatient Hospital Stay (HOSPITAL_BASED_OUTPATIENT_CLINIC_OR_DEPARTMENT_OTHER): Payer: PPO | Admitting: Oncology

## 2014-08-18 ENCOUNTER — Telehealth: Payer: Self-pay | Admitting: *Deleted

## 2014-08-18 VITALS — BP 161/96 | HR 111 | Temp 96.2°F | Wt 148.4 lb

## 2014-08-18 DIAGNOSIS — I10 Essential (primary) hypertension: Secondary | ICD-10-CM

## 2014-08-18 DIAGNOSIS — Z90710 Acquired absence of both cervix and uterus: Secondary | ICD-10-CM

## 2014-08-18 DIAGNOSIS — Z9011 Acquired absence of right breast and nipple: Secondary | ICD-10-CM

## 2014-08-18 DIAGNOSIS — N281 Cyst of kidney, acquired: Secondary | ICD-10-CM | POA: Insufficient documentation

## 2014-08-18 DIAGNOSIS — R160 Hepatomegaly, not elsewhere classified: Secondary | ICD-10-CM

## 2014-08-18 DIAGNOSIS — R918 Other nonspecific abnormal finding of lung field: Secondary | ICD-10-CM | POA: Insufficient documentation

## 2014-08-18 DIAGNOSIS — Z853 Personal history of malignant neoplasm of breast: Secondary | ICD-10-CM

## 2014-08-18 DIAGNOSIS — R5383 Other fatigue: Secondary | ICD-10-CM

## 2014-08-18 DIAGNOSIS — R0602 Shortness of breath: Secondary | ICD-10-CM | POA: Diagnosis not present

## 2014-08-18 DIAGNOSIS — K449 Diaphragmatic hernia without obstruction or gangrene: Secondary | ICD-10-CM | POA: Diagnosis not present

## 2014-08-18 DIAGNOSIS — Z809 Family history of malignant neoplasm, unspecified: Secondary | ICD-10-CM

## 2014-08-18 DIAGNOSIS — I2510 Atherosclerotic heart disease of native coronary artery without angina pectoris: Secondary | ICD-10-CM

## 2014-08-18 DIAGNOSIS — Z87442 Personal history of urinary calculi: Secondary | ICD-10-CM

## 2014-08-18 DIAGNOSIS — Z87891 Personal history of nicotine dependence: Secondary | ICD-10-CM

## 2014-08-18 DIAGNOSIS — R531 Weakness: Secondary | ICD-10-CM

## 2014-08-18 DIAGNOSIS — Z923 Personal history of irradiation: Secondary | ICD-10-CM

## 2014-08-18 DIAGNOSIS — Z8673 Personal history of transient ischemic attack (TIA), and cerebral infarction without residual deficits: Secondary | ICD-10-CM

## 2014-08-18 DIAGNOSIS — R05 Cough: Secondary | ICD-10-CM | POA: Diagnosis not present

## 2014-08-18 DIAGNOSIS — I288 Other diseases of pulmonary vessels: Secondary | ICD-10-CM | POA: Insufficient documentation

## 2014-08-18 DIAGNOSIS — Z9049 Acquired absence of other specified parts of digestive tract: Secondary | ICD-10-CM

## 2014-08-18 MED ORDER — IPRATROPIUM-ALBUTEROL 0.5-2.5 (3) MG/3ML IN SOLN
3.0000 mL | Freq: Once | RESPIRATORY_TRACT | Status: AC
  Administered 2014-08-18: 3 mL via RESPIRATORY_TRACT
  Filled 2014-08-18: qty 3

## 2014-08-18 MED ORDER — IOHEXOL 350 MG/ML SOLN
75.0000 mL | Freq: Once | INTRAVENOUS | Status: AC | PRN
  Administered 2014-08-18: 75 mL via INTRAVENOUS

## 2014-08-18 MED ORDER — ALBUTEROL SULFATE HFA 108 (90 BASE) MCG/ACT IN AERS: 1.0000 | INHALATION_SPRAY | RESPIRATORY_TRACT | Status: DC | PRN

## 2014-08-18 NOTE — Progress Notes (Signed)
Patient does have living will.  Former smoker. Patient acute add on for pain in chest, cough and SOB.

## 2014-08-18 NOTE — Telephone Encounter (Signed)
Appt agreed on by pt for 230 today

## 2014-08-19 ENCOUNTER — Encounter: Payer: Self-pay | Admitting: Oncology

## 2014-08-19 NOTE — Progress Notes (Signed)
[place] @ [facility] [phone]  Fax:([phone]) [place] OB: Aug 14, 1928  MR#: [id number]  OLM#:[id number]  Patient Care Team: [name], MD as PCP - General (Internal Medicine)  CHIEF COMPLAINT:  Chief Complaint  Patient presents with  . Acute Visit    VISIT DIAGNOSIS:     ICD-9-CM ICD-10-CM   1. Shortness of breath 786.05 R06.02 CT Angio Chest PE W/Cm &/Or Wo Cm     ipratropium-albuterol (DUONEB) 0.5-2.5 (3) MG/3ML nebulizer solution 3 mL     albuterol (PROVENTIL HFA;VENTOLIN HFA) 108 (90 BASE) MCG/ACT inhaler     Oncology History   1.  Diagnosis of right breast cancer T1 cN0 M0 tumor triple negative disease status post partial mastectomy in September 2013.  Patient underwent lumpectomy radiation therapy there is no record of evaluation by oncologist.  2pT1c pN0 (sn) cM0 triple negative grade 2 invasive carcinoma of the right breast lumpectomy and sentinel node study on 10/11/2011. Tumor size 1.8 cm, grade 2, margins uninvolved by invasive carcinoma.  DCIS present, closest margin is 5 mm. 2 sentinel lymph nodes negative. ER negative (0%), PR negative (0%), HER-2/neu FISH negative (HER2/CEP17 ratio 1.16). 3.  CT scan of the chest revealed multiple lung nodules and the one nodule in July of . 4.  Liver biopsy is consistent with adenocarcinoma most likely lung cancer versus cholangiocarcinoma breast cancer has been ruled out (July, 2016)     Breast CA   10/09/2011 Initial Diagnosis Breast CA stage IC triple negative disease status post lumpectomy and radiation therapy    Oncology Flowsheet 12/06/2012 08/12/2014 08/12/2014  ondansetron (ZOFRAN) IV 4 mg 4 mg 4 mg    INTERVAL HISTORY:  81 year old lady started feeling weak and tired.  Was evaluated by primary care physician initially she received a course of prednisone and antibiotic without much relief than patient was evaluated had a chest x-ray followed by CT scan.  CT scan revealed  right upper lobe lung mass multiple lung nodules and a liver nodule.  Patient had a previous history of node-negative carcinoma of breast. Feeling weak and tired.  No hemoptysis no chest pain no bony pains. August 18, 2014 Patient presented with acute onset of shortness of breath.  According to patient is started early this morning with increasing cough and shortness of breath patient ran out of inhaler.  She called her primary doctor and she was advised to go to the emergency room she did not want to wait so presented to the cancer center.  No chills.  No fever.  Oxygen saturation documented to be 87%.  REVIEW OF STEMS:    general status: Patient is feeling weak and tired.  No change in a performance status.  No chills.  No fever.  HEENT  No evidence of stomatitis Lungs: Dry hacking cough Patient is acutely short of breath started early this morning.  No chills.  No fever. Cardiac: No chest pain or paroxysmal nocturnal dyspnea GI: No nausea no vomiting no diarrhea no abdominal pain Poor appetite Skin: No rash Lower extremity no swelling Neurological system: No tingling.  No numbness.  No other focal signs Musculoskeletal system no bony pains  GU: No hematuria.  No dysuria. Extremely depressed.  As per HPI. Otherwise, a complete review of systems is negatve.  PAST MEDICAL HISTORY: Past Medical History  Diagnosis Date  . Hypertension   . CVA (cerebral infarction) 2010    Was on Plavix for 2 years, then switched to clopidogrel  but d/c due to side effects ("sick")  . Nephrolithiasis     per patient, passes on her own  . Stroke   . COPD (chronic obstructive pulmonary disease)   . GERD (gastroesophageal reflux disease)   . Breast cancer 2013    breast cancer right-radiation-lumpectomy    PAST SURGICAL HISTORY: Past Surgical History  Procedure Laterality Date  . Breast lumpectomy      left side  . Abdominal hysterectomy  1960s    "bleeding profusely"  . Cholecystectomy    . Hand  surgery      s/p fall    FAMILY HISTORY Family History  Problem Relation Age of Onset  . Throat cancer Mother     heavy smoker & drinker, died at 79 yo (1978)  . Heart failure Father   . Hypertension Sister   . Stroke Sister          ADVANCED DIRECTIVES:  Patient does have advance healthcare directive, Patient   does not desire to make any changes  HEALTH MAINTENANCE: History  Substance Use Topics  . Smoking status: Former Smoker -- 0.50 packs/day for 30 years    Types: Cigarettes    Quit date: 12/07/1978  . Smokeless tobacco: Never Used  . Alcohol Use: No     Allergies  Allergen Reactions  . Penicillins Anaphylaxis  . Aspirin Other (See Comments)    Pt states that she vomits blood.   [place] Spikes Derivatives Nausea And Vomiting    Current Outpatient Prescriptions  Medication Sig Dispense Refill  . acetaminophen (TYLENOL) 500 MG tablet Take 1,000 mg by mouth every 6 (six) hours as needed for mild pain or headache.     . albuterol (PROVENTIL HFA;VENTOLIN HFA) 108 (90 BASE) MCG/ACT inhaler Inhale 1-2 puffs into the lungs every 4 (four) hours as needed for wheezing or shortness of breath. 1 Inhaler 3  . benzonatate (TESSALON) 100 MG capsule Take 1 capsule (100 mg total) by mouth 3 (three) times daily. 30 capsule 0  . budesonide-formoterol (SYMBICORT) 160-4.5 MCG/ACT inhaler Inhale 2 puffs into the lungs 2 (two) times daily.    . clopidogrel (PLAVIX) 75 MG tablet Take 1 tablet (75 mg total) by mouth daily with breakfast. 90 tablet 0  . Cyanocobalamin (VITAMIN B-12 PO) Take 1 tablet by mouth daily.     . enalapril (VASOTEC) 20 MG tablet Take 20 mg by mouth daily.    Marland Kitchen esomeprazole (NEXIUM) 20 MG capsule Take 20-40 mg by mouth daily as needed (for indigestion/heartburn).     . Oxymetazoline HCl ([employer] NASAL SPRAY NA) Place 1 spray into the nose at bedtime as needed (for congestion).     . predniSONE (DELTASONE) 10 MG tablet 5 tabs po x 1 day, 4 tabs po x 1 day, 3 tabs po x 1  day, 2 tabs po x 1 day, 1 tab po x 1 day, then stop 15 tablet 0   No current facility-administered medications for this visit.    OBJECTIVE: PHYSICAL EXAM:has not lost sig General  status: Performance status is good.  Patient is feeling weak and tired.  Poor appetite.  Very depressed. HEENT: No evidence of stomatitis. Sclera and conjunctivae :: No jaundice.   pale looking. Lungs: bilateral  rhonchi.  Lymphatic system: Cervical, axillary, inguinal, lymph nodes not palpable GI: Abdomen is soft.  No ascites.  Liver spleen not palpable.  No tenderness.  Bowel sounds are within normal limit Lower extremity: No edema Neurological system: Higher functions, cranial nerves intact  no evidence of peripheral neuropathy. Skin: No rash.  No ecchymosis.Danley Danker Vitals:   08/18/14 1506  BP: 161/96  Pulse: 111  Temp: 96.2 F (35.7 C)     Body mass index is 28.05 kg/(m^2).    ECOG FS:1 - Symptomatic but completely ambulatory  LAB RESULTS:  No visits with results within 2 Day(s) from this visit. Latest known visit with results is:  Hospital Outpatient Visit on 08/15/2014  Component Date Value Ref Range Status  . Glucose-Capillary 08/15/2014 79  65 - 99 mg/dL Final     STUDIES: Ct Abdomen Wo Contrast  11-Sep-2014   CLINICAL DATA:  Acute upper abdominal pain after hepatic biopsy.  EXAM: CT ABDOMEN WITHOUT CONTRAST  TECHNIQUE: Multidetector CT imaging of the abdomen was performed following the standard protocol without IV contrast.  COMPARISON:  CT scan of same day.  FINDINGS: Status post cholecystectomy. No pneumothorax is noted in visualized lung bases. Hypodense mass is noted in right hepatic lobe with Gel-Foam surrounding it consistent with recent percutaneous biopsy. There does appear to be a small subcapsular hematoma seen anteriorly over the right hepatic lobe. No other hemorrhage is noted. Atherosclerosis of abdominal aorta is noted without aneurysm formation. Large left renal cyst is again  noted. The spleen and pancreas appear normal. Right renal cysts are stable. There is no evidence of bowel obstruction.  IMPRESSION: Right hepatic lesion is again noted which was the object of biopsy today. Gel-Foam is noted in and surrounding the lesion. This is expected finding status post biopsy. Small subcapsular hematoma is seen along the anterior portion of the right hepatic lobe.   Electronically Signed   By: Marijo Conception, M.D.   On: September 11, 2014 12:59   Ct Chest W Contrast  07/29/2014   CLINICAL DATA:  Right upper lobe lung mass.  EXAM: CT CHEST WITH CONTRAST  TECHNIQUE: Multidetector CT imaging of the chest was performed during intravenous contrast administration.  CONTRAST:  42m OMNIPAQUE IOHEXOL 300 MG/ML  SOLN  COMPARISON:  CT chest dated 05/31/2010  FINDINGS: Mediastinum/Nodes: Heart is normal in size. No pericardial effusion.  Coronary atherosclerosis.  Atherosclerotic calcifications of the aortic arch.  Thoracic lymphadenopathy, including:  --10 mm short axis left supraclavicular node (series 2/ image 5)  --12 mm short axis right paratracheal node (series 2/ image 18)  --13 mm short axis low right paratracheal node (series 2/ image 22)  --11 mm short axis subcarinal node (series 2/ image 24)  --13 mm short axis right hilar node (series 2/ image 24)  --14 mm short axis azygoesophageal recess node (series 2/image 20)  Lungs/Pleura: 3.0 x 2.9 x 2.9 cm spiculated right upper lobe mass (series 3/image 18), corresponding to the previous 2.1 x 1.7 cm subsolid/ground-glass nodule, worrisome for primary bronchogenic neoplasm. Associated pleural involvement anteriorly.  Adjacent satellite nodularity (series 3/ image 18). Additional 2.2 cm sub solid/ ground-glass nodule in the superior right lower lobe (series 3/ image 22), previously reflecting a 1.5 cm ground-glass nodule, worrisome for additional focus of neoplasm.  Multifocal tree-in-bud nodules in the bilateral lower lobes (series 3/ image 39), possibly  infectious/ inflammatory.  Underlying mild emphysematous changes.  Radiation changes in the anterior right upper lobe.  Small right pleural effusion.  No pneumothorax.  Upper abdomen: Visualized upper abdomen is notable for a 2.4 cm lesion in the right liver (series 2/ image 48), worrisome for metastasis.  Additionally present is a 7 mm nonobstructing right renal calculus (series 2/ image 49), an 11.6 cm  left renal cyst (series 2/ image 54), small right renal cysts, and vascular calcifications.  Musculoskeletal: Degenerative changes of the visualized thoracolumbar spine.  IMPRESSION: 3.0 cm spiculated right upper lobe mass, increased, worrisome for primary bronchogenic neoplasm. Associated pleural/anterior chest wall involvement. Additional right upper lobe satellite nodularity.  2.2 cm subsolid/ground-glass nodule in the superior right lower lobe, increased, worrisome for synchronous neoplasm.  Multifocal tree-in-bud nodules in the bilateral lower lobes, possibly infectious/ inflammatory.  Small right pleural effusion.  Left supraclavicular, mediastinal, and right hilar nodal metastases.  2.4 cm lesion in the right liver, worrisome for metastasis.   Electronically Signed   By: Julian Hy M.D.   On: 07/29/2014 13:48   Ct Angio Chest Pe W/cm &/or Wo Cm  08/18/2014   CLINICAL DATA:  LEFT chest pain with cough starting this morning, history of lung cancer, RIGHT breast cancer, hypertension, stroke, former smoker  EXAM: CT ANGIOGRAPHY CHEST WITH CONTRAST  TECHNIQUE: Multidetector CT imaging of the chest was performed using the standard protocol during bolus administration of intravenous contrast. Multiplanar CT image reconstructions and MIPs were obtained to evaluate the vascular anatomy.  CONTRAST:  56m OMNIPAQUE IOHEXOL 350 MG/ML SOLN IV  COMPARISON:  07/29/2014  FINDINGS: Scattered atherosclerotic calcifications aorta, proximal great vessels, and coronary arteries.  No aortic aneurysm or dissection.   Pulmonary arteries well opacified with minimal dilatation of central pulmonary arteries.  Pulmonary arteries patent without evidence of pulmonary embolism.  Large cyst upper pole LEFT kidney 11.3 x 9.8 cm.  Multiple small RIGHT renal cyst.  Moderate-sized hiatal hernia.  Vague area of low attenuation lateral RIGHT lobe liver 3.3 cm greatest size corresponding to liver lesion which was previously biopsied.  Remaining visualized portions of upper abdomen unremarkable.  Enlarged RIGHT paratracheal lymph node 14 mm short axis image 35 previously 12 mm.  Enlarged precarinal lymph node 13 mm short axis image 47 unchanged.  Adenopathy at superior aspect of RIGHT pulmonary hilum unchanged, 14 mm short axis image 47.  RIGHT upper lobe mass anteriorly compatible with neoplasm, appears larger than on previous exam though this could be overestimated due to adjacent atelectasis, currently measuring 3.6 x 2.9 cm previously 3.0 x 2.9 cm.  Focus of infiltrate versus non solid opacity in RIGHT upper lobe more inferolaterally appears stable.  Additional stellate semi solid opacity in RIGHT lower lobe 2.5 cm diameter image 62 little changed.  Patchy sub solid, solid, and nodular infiltrates in RIGHT lower lobe again identified.  Subtle areas of non solid opacity in the LEFT lung are also again identified, grossly similar to previous exam no additional discrete mass or nodule identified.  Tiny RIGHT pleural effusion.  No pneumothorax.  Thoracolumbar dextro convex scoliosis with rotatory component.  Osseous demineralization.  Review of the MIP images confirms the above findings.  IMPRESSION: No evidence of pulmonary embolism.  Mild dilatation of the central pulmonary arteries raising question of pulmonary arterial hypertension.  RIGHT upper lobe mass compatible with neoplasm question minimally larger than on previous exam versus overestimation due to adjacent atelectasis which appears increased.  2.5 cm diameter stellate semi solid  opacity RIGHT lower lobe question additional tumor.  Multiple additional solid, semi solid and non solid opacities in both lungs may represent inflammatory process though additional tumor not completely excluded, recommend attention on follow-up exams ; some with the more nodular appearing opacities in the RIGHT lower lobe have decreased in size since the previous exam.  Renal cysts including an 11.3 cm cyst at the upper  pole of the LEFT kidney.  Small hiatal hernia.   Electronically Signed   By: Lavonia Dana M.D.   On: 08/18/2014 17:26   Nm Pet Image Initial (pi) Skull Base To Thigh  08/15/2014   CLINICAL DATA:  Subsequent treatment strategy for liver lesion biopsied on 08/12/2014. History of right breast cancer in 2013 with last radiation therapy in 2014.  EXAM: NUCLEAR MEDICINE PET SKULL BASE TO THIGH  TECHNIQUE: 12.42 mCi F-18 FDG was injected intravenously. Full-ring PET imaging was performed from the skull base to thigh after the radiotracer. CT data was obtained and used for attenuation correction and anatomic localization.  FASTING BLOOD GLUCOSE:  Value: 79 mg/dl  COMPARISON:  Abdominal CT 08/12/2014.  Chest CT 07/29/2014.  FINDINGS: NECK  There are 2 adjacent left supraclavicular lymph nodes which are hypermetabolic. The larger of these measures 10 mm short axis on image 66 and has an SUV max of 4.1. No other hypermetabolic cervical lymph nodes demonstrated.There are no lesions of the pharyngeal mucosal space.  CHEST  The enlarged right paratracheal and right hilar lymph nodes on recent CT are hypermetabolic. The largest right paratracheal node measures 1.7 cm short axis on image 90 and has an SUV max of 10.0. No hypermetabolic subcarinal, left hilar or axillary adenopathy. The dominant spiculated right upper lobe mass is now partly obscured by atelectasis, but measures approximately 3.3 x 3.1 cm on image 84 and is hypermetabolic with an SUV max of 15.9. The other smaller ground-glass opacities in the  right upper lobe (image 84) and in the superior segment of the right lower lobe (image 96) also demonstrate low-level hypermetabolic activity (SUV max 2.1). There is a 9 mm focal ground-glass opacity in the left upper lobe on image 84 which also demonstrates low-level activity. There is increased patchy airspace opacity dependently in the right lower lobe with associated low level metabolic activity, likely inflammatory.  ABDOMEN/PELVIS  The recently biopsied dominant lesion in the right hepatic lobe is hypermetabolic. This lesion measures approximately 3.5 cm on image 128 and has an SUV max of 10.6. There are at least 2 smaller adjacent hypermetabolic metastases within the right hepatic lobe. There is no suspicious activity within the spleen, pancreas, adrenal glands or kidneys. There is low-level hypermetabolic activity within a single lymph node in the porta hepatis (SUV max 4.0). There is no other hypermetabolic nodal activity. There are low-density adnexal lesions bilaterally status post hysterectomy without associated abnormal metabolic activity. There are multiple low-density renal lesions, including a dominant left renal cyst without suspicious metabolic activity. Nonobstructing calculus noted in the upper pole of the right kidney, diffuse atherosclerosis and sigmoid diverticulosis noted.  SKELETON  There is a single hypermetabolic osseous metastasis within the right ischium. This is lytic and has an SUV max of 10.2. Low-level activity anteriorly at C1-2 is likely inflammatory. There is a moderate thoracolumbar scoliosis.  IMPRESSION: 1. The dominant right upper lobe pulmonary mass is hypermetabolic, most consistent with bronchogenic carcinoma. There are associated left supraclavicular, right hilar and mediastinal nodal metastases, at least 3 hepatic metastases, and a right ischial nodal metastasis. 2. There are additional ground-glass opacities in both lungs which may represent synchronous adenocarcinoma.  3. Increased patchy dependent opacity in the right lower lobe, likely inflammatory.   Electronically Signed   By: Richardean Sale M.D.   On: 08/15/2014 12:23   Ct Biopsy  08/12/2014   CLINICAL DATA:  Right hepatic mass.  EXAM: CT GUIDED core BIOPSY OF right hepatic lobe  mass.  ANESTHESIA/SEDATION: 1.5  Mg IV Versed; 100 mcg IV Fentanyl  Total Moderate Sedation Time: 40 minutes.  PROCEDURE: The procedure risks, benefits, and alternatives were explained to the patient, including pneumothorax, bleeding and infection. Questions regarding the procedure were encouraged and answered. The patient understands and consents to the procedure.  The right upper quadrant of the abdomen was prepped with chlorhexidinein a sterile fashion, and a sterile drape was applied covering the operative field. Sterile gloves were used for the procedure. Local anesthesia was provided with 1% Lidocaine.  Under CT guidance, 17 gauge guiding needle was directed toward lesion in superior and posterior portion of right hepatic lobe. Four core samples were obtained using 18 gauge biopsy needle. Needle was then removed with the simultaneous injection of Gel-Foam slurry. Appropriate dressing was applied.  Complications: None immediate noted on post biopsy imaging.  FINDINGS: Right hepatic mass as described on prior studies.  IMPRESSION: Under CT guidance, percutaneous core biopsy was performed of right hepatic lobe lesion concerning for metastasis.   Electronically Signed   By: Marijo Conception, M.D.   On: 08/12/2014 11:56    ASSESSMENT: CT scan of the chest and abdomen revealed multiple lung nodule and liver nodule. In September 2013 patient had a right breast cancer triple negative disease T1 cN0 M0 tumor status post resection and radiation therapy in no further adjuvant chemotherapy PET scan has been reviewed Right upper lobe lung mass mediastinal adenopathy and multiple liver metastases Biopsy of liver mass is pending Discussed situation  with pathologist there waiting for final staining report to come back  PLAN:   Acute onset of shortness of breath appears to be bronchospasm.  Possibility of pulmonary embolism cannot be ruled out  Patient was given a inhalation treatment.  Patient was reexamined mean bronchospasm is improved.  Patient was advised to go in by her inhaler with Symbicort continue prednisone therapy use albuterol as a rescue inhaler.  CT scan was done to rule out pulmonary embolism and that has been reviewed independently there is no evidence of pulmonary embolism. Patient was advised to go to the emergency room here.  Again gets short of breath patient may need aggressive inhalation therapy Further workup for malignancy with the guidance study in markers have been ordered. CA 19-9 will be obtained Total duration of visit was 45 minutes.  50% or more time was spent in counseling patient and family regarding prognosis and options of treatment and available resources   Breast CA   Staging form: Breast, AJCC 7th Edition     Clinical: Stage IA (T1c, N0, M0) - Marni Griffon, MD   08/19/2014 7:51 AM

## 2014-08-23 ENCOUNTER — Other Ambulatory Visit: Payer: Self-pay | Admitting: Surgery

## 2014-08-23 DIAGNOSIS — Z853 Personal history of malignant neoplasm of breast: Secondary | ICD-10-CM

## 2014-08-25 ENCOUNTER — Ambulatory Visit: Payer: PPO

## 2014-08-25 ENCOUNTER — Inpatient Hospital Stay (HOSPITAL_BASED_OUTPATIENT_CLINIC_OR_DEPARTMENT_OTHER): Payer: PPO | Admitting: Oncology

## 2014-08-25 VITALS — BP 159/88 | HR 121 | Temp 98.7°F | Wt 150.8 lb

## 2014-08-25 DIAGNOSIS — R160 Hepatomegaly, not elsewhere classified: Secondary | ICD-10-CM | POA: Diagnosis not present

## 2014-08-25 DIAGNOSIS — R5383 Other fatigue: Secondary | ICD-10-CM

## 2014-08-25 DIAGNOSIS — R0602 Shortness of breath: Secondary | ICD-10-CM

## 2014-08-25 DIAGNOSIS — N281 Cyst of kidney, acquired: Secondary | ICD-10-CM

## 2014-08-25 DIAGNOSIS — Z87891 Personal history of nicotine dependence: Secondary | ICD-10-CM

## 2014-08-25 DIAGNOSIS — Z9011 Acquired absence of right breast and nipple: Secondary | ICD-10-CM

## 2014-08-25 DIAGNOSIS — I2510 Atherosclerotic heart disease of native coronary artery without angina pectoris: Secondary | ICD-10-CM

## 2014-08-25 DIAGNOSIS — Z9049 Acquired absence of other specified parts of digestive tract: Secondary | ICD-10-CM

## 2014-08-25 DIAGNOSIS — R531 Weakness: Secondary | ICD-10-CM

## 2014-08-25 DIAGNOSIS — R05 Cough: Secondary | ICD-10-CM | POA: Diagnosis not present

## 2014-08-25 DIAGNOSIS — Z90710 Acquired absence of both cervix and uterus: Secondary | ICD-10-CM

## 2014-08-25 DIAGNOSIS — R918 Other nonspecific abnormal finding of lung field: Secondary | ICD-10-CM | POA: Diagnosis not present

## 2014-08-25 DIAGNOSIS — Z923 Personal history of irradiation: Secondary | ICD-10-CM

## 2014-08-25 DIAGNOSIS — I10 Essential (primary) hypertension: Secondary | ICD-10-CM

## 2014-08-25 DIAGNOSIS — Z8673 Personal history of transient ischemic attack (TIA), and cerebral infarction without residual deficits: Secondary | ICD-10-CM

## 2014-08-25 DIAGNOSIS — Z853 Personal history of malignant neoplasm of breast: Secondary | ICD-10-CM

## 2014-08-25 DIAGNOSIS — Z809 Family history of malignant neoplasm, unspecified: Secondary | ICD-10-CM

## 2014-08-25 DIAGNOSIS — J441 Chronic obstructive pulmonary disease with (acute) exacerbation: Secondary | ICD-10-CM

## 2014-08-25 DIAGNOSIS — Z87442 Personal history of urinary calculi: Secondary | ICD-10-CM

## 2014-08-25 LAB — COMPREHENSIVE METABOLIC PANEL
ALK PHOS: 65 U/L (ref 38–126)
ALT: 19 U/L (ref 14–54)
AST: 18 U/L (ref 15–41)
Albumin: 2.9 g/dL — ABNORMAL LOW (ref 3.5–5.0)
Anion gap: 8 (ref 5–15)
BUN: 12 mg/dL (ref 6–20)
CO2: 27 mmol/L (ref 22–32)
Calcium: 8.4 mg/dL — ABNORMAL LOW (ref 8.9–10.3)
Chloride: 99 mmol/L — ABNORMAL LOW (ref 101–111)
Creatinine, Ser: 1.08 mg/dL — ABNORMAL HIGH (ref 0.44–1.00)
GFR calc non Af Amer: 46 mL/min — ABNORMAL LOW (ref >60)
GFR, EST AFRICAN AMERICAN: 53 mL/min — AB (ref >60)
Glucose, Bld: 101 mg/dL — ABNORMAL HIGH (ref 65–99)
Potassium: 3.3 mmol/L — ABNORMAL LOW (ref 3.5–5.1)
Sodium: 134 mmol/L — ABNORMAL LOW (ref 135–145)
Total Bilirubin: 0.9 mg/dL (ref 0.3–1.2)
Total Protein: 6.4 g/dL — ABNORMAL LOW (ref 6.5–8.1)

## 2014-08-25 LAB — CBC WITH DIFFERENTIAL/PLATELET
Basophils Absolute: 0 10*3/uL (ref 0–0.1)
Basophils Relative: 0 %
Eosinophils Absolute: 0.1 10*3/uL (ref 0–0.7)
Eosinophils Relative: 1 %
HEMATOCRIT: 31.5 % — AB (ref 35.0–47.0)
Hemoglobin: 9.9 g/dL — ABNORMAL LOW (ref 12.0–16.0)
LYMPHS PCT: 6 %
Lymphs Abs: 0.8 10*3/uL — ABNORMAL LOW (ref 1.0–3.6)
MCH: 26.9 pg (ref 26.0–34.0)
MCHC: 31.4 g/dL — ABNORMAL LOW (ref 32.0–36.0)
MCV: 85.6 fL (ref 80.0–100.0)
Monocytes Absolute: 1.1 10*3/uL — ABNORMAL HIGH (ref 0.2–0.9)
Monocytes Relative: 9 %
NEUTROS ABS: 10.9 10*3/uL — AB (ref 1.4–6.5)
NEUTROS PCT: 84 %
Platelets: 385 10*3/uL (ref 150–440)
RBC: 3.68 MIL/uL — ABNORMAL LOW (ref 3.80–5.20)
RDW: 16.1 % — ABNORMAL HIGH (ref 11.5–14.5)
WBC: 12.9 10*3/uL — ABNORMAL HIGH (ref 3.6–11.0)

## 2014-08-25 MED ORDER — ALBUTEROL SULFATE (2.5 MG/3ML) 0.083% IN NEBU
INHALATION_SOLUTION | RESPIRATORY_TRACT | Status: AC
  Filled 2014-08-25: qty 3

## 2014-08-25 MED ORDER — AZITHROMYCIN 500 MG PO TABS: 500.0000 mg | ORAL_TABLET | Freq: Every day | ORAL | Status: DC

## 2014-08-25 MED ORDER — IPRATROPIUM-ALBUTEROL 0.5-2.5 (3) MG/3ML IN SOLN
3.0000 mL | Freq: Once | RESPIRATORY_TRACT | Status: AC
  Administered 2014-08-25: 3 mL via RESPIRATORY_TRACT

## 2014-08-25 MED ORDER — METHYLPREDNISOLONE SODIUM SUCC 125 MG IJ SOLR
INTRAMUSCULAR | Status: AC
  Filled 2014-08-25: qty 2

## 2014-08-25 MED ORDER — IPRATROPIUM-ALBUTEROL 0.5-2.5 (3) MG/3ML IN SOLN: 3.0000 mL | Freq: Three times a day (TID) | RESPIRATORY_TRACT | Status: DC | PRN

## 2014-08-25 MED ORDER — METHYLPREDNISOLONE SODIUM SUCC 125 MG IJ SOLR
125.0000 mg | Freq: Once | INTRAMUSCULAR | Status: AC
  Administered 2014-08-25: 125 mg via INTRAVENOUS

## 2014-08-25 NOTE — Progress Notes (Signed)
Patient does have living will.  Former smoker.  Patient continues to have cough and congestion.  States she ran out of air this morning while walking in her house and could not catch her breath.  Ambulated patient in hallway.  Walked short distance and she had to stop due to not being able to catch her breath and wheezing.  O2 registered 98% at that time.  Patent here today for CT results.

## 2014-08-26 ENCOUNTER — Telehealth: Payer: Self-pay | Admitting: *Deleted

## 2014-08-26 LAB — CANCER ANTIGEN 19-9: CA 19-9: 673 U/mL — ABNORMAL HIGH (ref 0–35)

## 2014-08-26 NOTE — Telephone Encounter (Signed)
Has used back to back nebs this morning after checking with pharmacy and not having any relief from the first one and has done another this afternoon and has not noticed any difference, still sob, wheezing can't catch breath just to walk to bathroom.  What else can be done?

## 2014-08-26 NOTE — Telephone Encounter (Signed)
I spoke with Dr Grayland Ormond who said that she needs to give it more time to work, since she just started this morning. If it gets worse or does not improve, she is to go to the ED since there is really nothing more we can do for her over the phone. She was notified of this and verbalized understand stating I wandered if it would take a little time to get in my system.

## 2014-08-27 ENCOUNTER — Encounter: Payer: Self-pay | Admitting: Oncology

## 2014-08-27 NOTE — Progress Notes (Signed)
[place] @ [facility] Telephone:(336) [phone]  Fax:(336) [place]: December 14, 1928  MR#: [id number]  ZRA#:[id number]  Patient Care Team: [name], MD as PCP - General (Internal Medicine)  CHIEF COMPLAINT:  Chief Complaint  Patient presents with  . Follow-up    VISIT DIAGNOSIS:     ICD-9-CM ICD-10-CM   1. Liver mass 573.9 R16.0 budesonide-formoterol (SYMBICORT) 160-4.5 MCG/ACT inhaler     CBC with Differential     Comprehensive metabolic panel     Cancer antigen 19-9     azithromycin (ZITHROMAX) 500 MG tablet     ipratropium-albuterol (DUONEB) 0.5-2.5 (3) MG/3ML SOLN     DISCONTINUED: ipratropium-albuterol (DUONEB) 0.5-2.5 (3) MG/3ML SOLN  2. SOB (shortness of breath) 786.05 R06.02 budesonide-formoterol (SYMBICORT) 160-4.5 MCG/ACT inhaler     CBC with Differential     Comprehensive metabolic panel     Cancer antigen 19-9     azithromycin (ZITHROMAX) 500 MG tablet     ipratropium-albuterol (DUONEB) 0.5-2.5 (3) MG/3ML SOLN     DISCONTINUED: ipratropium-albuterol (DUONEB) 0.5-2.5 (3) MG/3ML SOLN  3. Chronic obstructive pulmonary disease with acute exacerbation 491.21 J44.1 budesonide-formoterol (SYMBICORT) 160-4.5 MCG/ACT inhaler     CBC with Differential     Comprehensive metabolic panel     Cancer antigen 19-9     azithromycin (ZITHROMAX) 500 MG tablet     ipratropium-albuterol (DUONEB) 0.5-2.5 (3) MG/3ML SOLN     DISCONTINUED: ipratropium-albuterol (DUONEB) 0.5-2.5 (3) MG/3ML SOLN     Oncology History   1.  Diagnosis of right breast cancer T1 cN0 M0 tumor triple negative disease status post partial mastectomy in September 2013.  Patient underwent lumpectomy radiation therapy there is no record of evaluation by oncologist.  2pT1c pN0 (sn) cM0 triple negative grade 2 invasive carcinoma of the right breast lumpectomy and sentinel node study on 10/11/2011. Tumor size 1.8 cm, grade 2, margins uninvolved by invasive carcinoma.  DCIS present,  closest margin is 5 mm. 2 sentinel lymph nodes negative. ER negative (0%), PR negative (0%), HER-2/neu FISH negative (HER2/CEP17 ratio 1.16). 3.  CT scan of the chest revealed multiple lung nodules and the one nodule in July of . 4.  Liver biopsy is consistent with adenocarcinoma most likely lung cancer versus cholangiocarcinoma breast cancer has been ruled out (July, 2016)     Breast CA   10/09/2011 Initial Diagnosis Breast CA stage IC triple negative disease status post lumpectomy and radiation therapy    Oncology Flowsheet 12/06/2012 08/12/2014 08/12/2014 08/25/2014  methylPREDNISolone sodium succinate 125 mg/2 mL (SOLU-MEDROL) IV - - - 125 mg  ondansetron (ZOFRAN) IV 4 mg 4 mg 4 mg -    INTERVAL HISTORY:  81 year old lady started feeling weak and tired.  Was evaluated by primary care physician initially she received a course of prednisone and antibiotic without much relief than patient was evaluated had a chest x-ray followed by CT scan.  CT scan revealed right upper lobe lung mass multiple lung nodules and a liver nodule.  Patient had a previous history of node-negative carcinoma of breast. Feeling weak and tired.  No hemoptysis no chest pain no bony pains. August 18, 2014 Patient presented with acute onset of shortness of breath.  According to patient is started early this morning with increasing cough and shortness of breath patient ran out of inhaler.  She called her primary doctor and she was advised to go to the emergency room she did not want to wait so presented to the cancer  center.  No chills.  No fever.  Oxygen saturation documented to be 87%  July  28 th ,2016  Patient came today for further follow-up and treatment consideration.  Having increasing shortness of breath. Cough yellowish expectoration No chills.  No fever. Appetite stable REVIEW OF STEMS:    general status: Patient is feeling weak and tired.  No change in a performance status.  No chills.  No fever.  HEENT  No  evidence of stomatitis Lungs: Dry hacking cough.  Increasing shortness of breath Patient is acutely short of breath started early this morning.  No chills.  No fever. Cardiac: No chest pain or paroxysmal nocturnal dyspnea GI: No nausea no vomiting no diarrhea no abdominal pain Poor appetite Skin: No rash Lower extremity no swelling Neurological system: No tingling.  No numbness.  No other focal signs Musculoskeletal system no bony pains  GU: No hematuria.  No dysuria. Extremely depressed.  As per HPI. Otherwise, a complete review of systems is negatve.  PAST MEDICAL HISTORY: Past Medical History  Diagnosis Date  . Hypertension   . CVA (cerebral infarction) 2010    Was on Plavix for 2 years, then switched to clopidogrel but d/c due to side effects ("sick")  . Nephrolithiasis     per patient, passes on her own  . Stroke   . COPD (chronic obstructive pulmonary disease)   . GERD (gastroesophageal reflux disease)   . Breast cancer 2013    breast cancer right-radiation-lumpectomy    PAST SURGICAL HISTORY: Past Surgical History  Procedure Laterality Date  . Breast lumpectomy      left side  . Abdominal hysterectomy  1960s    "bleeding profusely"  . Cholecystectomy    . Hand surgery      s/p fall    FAMILY HISTORY Family History  Problem Relation Age of Onset  . Throat cancer Mother     heavy smoker & drinker, died at 79 yo (1978)  . Heart failure Father   . Hypertension Sister   . Stroke Sister          ADVANCED DIRECTIVES:  Patient does have advance healthcare directive, Patient   does not desire to make any changes  HEALTH MAINTENANCE: History  Substance Use Topics  . Smoking status: Former Smoker -- 0.50 packs/day for 30 years    Types: Cigarettes    Quit date: 12/07/1978  . Smokeless tobacco: Never Used  . Alcohol Use: No     Allergies  Allergen Reactions  . Penicillins Anaphylaxis  . Aspirin Other (See Comments)    Pt states that she vomits blood.    [place] Spikes Derivatives Nausea And Vomiting    Current Outpatient Prescriptions  Medication Sig Dispense Refill  . acetaminophen (TYLENOL) 500 MG tablet Take 1,000 mg by mouth every 6 (six) hours as needed for mild pain or headache.     . albuterol (PROVENTIL HFA;VENTOLIN HFA) 108 (90 BASE) MCG/ACT inhaler Inhale 1-2 puffs into the lungs every 4 (four) hours as needed for wheezing or shortness of breath. 1 Inhaler 3  . benzonatate (TESSALON) 100 MG capsule Take 1 capsule (100 mg total) by mouth 3 (three) times daily. 30 capsule 0  . budesonide-formoterol (SYMBICORT) 160-4.5 MCG/ACT inhaler Inhale 2 puffs into the lungs 2 (two) times daily.    . budesonide-formoterol (SYMBICORT) 160-4.5 MCG/ACT inhaler Inhale into the lungs.    . clopidogrel (PLAVIX) 75 MG tablet Take 1 tablet (75 mg total) by mouth daily with breakfast. 90 tablet  0  . Cyanocobalamin (VITAMIN B-12 PO) Take 1 tablet by mouth daily.     . enalapril (VASOTEC) 20 MG tablet Take 20 mg by mouth daily.    Marland Kitchen esomeprazole (NEXIUM) 20 MG capsule Take 20-40 mg by mouth daily as needed (for indigestion/heartburn).     . Oxymetazoline HCl ([employer] NASAL SPRAY NA) Place 1 spray into the nose at bedtime as needed (for congestion).     . predniSONE (DELTASONE) 10 MG tablet 5 tabs po x 1 day, 4 tabs po x 1 day, 3 tabs po x 1 day, 2 tabs po x 1 day, 1 tab po x 1 day, then stop 15 tablet 0  . azithromycin (ZITHROMAX) 500 MG tablet Take 1 tablet (500 mg total) by mouth daily. 5 tablet 0  . ipratropium-albuterol (DUONEB) 0.5-2.5 (3) MG/3ML SOLN Take 3 mLs by nebulization 3 (three) times daily as needed. Dx: chronic COPD 360 mL 3   No current facility-administered medications for this visit.    OBJECTIVE: PHYSICAL EXAM:has not lost sig General  status: Performance status is good.  Patient is feeling weak and tired.  Poor appetite.  Very depressed. HEENT: No evidence of stomatitis. Sclera and conjunctivae :: No jaundice.   pale looking. Lungs:  bilateral  rhonchi.  Lymphatic system: Cervical, axillary, inguinal, lymph nodes not palpable Lateral rhonchi.  Diminished air entry on both sides. GI: Abdomen is soft.  No ascites.  Liver spleen not palpable.  No tenderness.  Bowel sounds are within normal limit Lower extremity: No edema Neurological system: Higher functions, cranial nerves intact no evidence of peripheral neuropathy. Skin: No rash.  No ecchymosis.Danley Danker Vitals:   08/25/14 1035  BP: 159/88  Pulse: 121  Temp: 98.7 F (37.1 C)     Body mass index is 28.51 kg/(m^2).    ECOG FS:1 - Symptomatic but completely ambulatory  LAB RESULTS:  Appointment on 08/25/2014  Component Date Value Ref Range Status  . WBC 08/25/2014 12.9* 3.6 - 11.0 K/uL Final  . RBC 08/25/2014 3.68* 3.80 - 5.20 MIL/uL Final  . Hemoglobin 08/25/2014 9.9* 12.0 - 16.0 g/dL Final  . HCT 08/25/2014 31.5* 35.0 - 47.0 % Final  . MCV 08/25/2014 85.6  80.0 - 100.0 fL Final  . MCH 08/25/2014 26.9  26.0 - 34.0 pg Final  . MCHC 08/25/2014 31.4* 32.0 - 36.0 g/dL Final  . RDW 08/25/2014 16.1* 11.5 - 14.5 % Final  . Platelets 08/25/2014 385  150 - 440 K/uL Final  . Neutrophils Relative % 08/25/2014 84   Final  . Neutro Abs 08/25/2014 10.9* 1.4 - 6.5 K/uL Final  . Lymphocytes Relative 08/25/2014 6   Final  . Lymphs Abs 08/25/2014 0.8* 1.0 - 3.6 K/uL Final  . Monocytes Relative 08/25/2014 9   Final  . Monocytes Absolute 08/25/2014 1.1* 0.2 - 0.9 K/uL Final  . Eosinophils Relative 08/25/2014 1   Final  . Eosinophils Absolute 08/25/2014 0.1  0 - 0.7 K/uL Final  . Basophils Relative 08/25/2014 0   Final  . Basophils Absolute 08/25/2014 0.0  0 - 0.1 K/uL Final  . Sodium 08/25/2014 134* 135 - 145 mmol/L Final  . Potassium 08/25/2014 3.3* 3.5 - 5.1 mmol/L Final  . Chloride 08/25/2014 99* 101 - 111 mmol/L Final  . CO2 08/25/2014 27  22 - 32 mmol/L Final  . Glucose, Bld 08/25/2014 101* 65 - 99 mg/dL Final  . BUN 08/25/2014 12  6 - 20 mg/dL Final  . Creatinine, Ser  08/25/2014 1.08*  0.44 - 1.00 mg/dL Final  . Calcium 08/25/2014 8.4* 8.9 - 10.3 mg/dL Final  . Total Protein 08/25/2014 6.4* 6.5 - 8.1 g/dL Final  . Albumin 08/25/2014 2.9* 3.5 - 5.0 g/dL Final  . AST 08/25/2014 18  15 - 41 U/L Final  . ALT 08/25/2014 19  14 - 54 U/L Final  . Alkaline Phosphatase 08/25/2014 65  38 - 126 U/L Final  . Total Bilirubin 08/25/2014 0.9  0.3 - 1.2 mg/dL Final  . GFR calc non Af Amer 08/25/2014 46* >60 mL/min Final  . GFR calc Af Amer 08/25/2014 53* >60 mL/min Final   Comment: (NOTE) The eGFR has been calculated using the CKD EPI equation. This calculation has not been validated in all clinical situations. eGFR's persistently <60 mL/min signify possible Chronic Kidney Disease.   . Anion gap 08/25/2014 8  5 - 15 Final  . CA 19-9 08/25/2014 673* 0 - 35 U/mL Final   Comment: (NOTE) [employer] ECLIA methodology Performed At: [facility] [place], [place] [id number] Lindon Romp MD LZ:[id number]      STUDIES: Ct Abdomen Wo Contrast  September 08, 2014   CLINICAL DATA:  Acute upper abdominal pain after hepatic biopsy.  EXAM: CT ABDOMEN WITHOUT CONTRAST  TECHNIQUE: Multidetector CT imaging of the abdomen was performed following the standard protocol without IV contrast.  COMPARISON:  CT scan of same day.  FINDINGS: Status post cholecystectomy. No pneumothorax is noted in visualized lung bases. Hypodense mass is noted in right hepatic lobe with Gel-Foam surrounding it consistent with recent percutaneous biopsy. There does appear to be a small subcapsular hematoma seen anteriorly over the right hepatic lobe. No other hemorrhage is noted. Atherosclerosis of abdominal aorta is noted without aneurysm formation. Large left renal cyst is again noted. The spleen and pancreas appear normal. Right renal cysts are stable. There is no evidence of bowel obstruction.  IMPRESSION: Right hepatic lesion is again noted which was the object of biopsy today. Gel-Foam is noted  in and surrounding the lesion. This is expected finding status post biopsy. Small subcapsular hematoma is seen along the anterior portion of the right hepatic lobe.   Electronically Signed   By: Marijo Conception, M.D.   On: September 08, 2014 12:59   Ct Chest W Contrast  07/29/2014   CLINICAL DATA:  Right upper lobe lung mass.  EXAM: CT CHEST WITH CONTRAST  TECHNIQUE: Multidetector CT imaging of the chest was performed during intravenous contrast administration.  CONTRAST:  84m OMNIPAQUE IOHEXOL 300 MG/ML  SOLN  COMPARISON:  CT chest dated 05/31/2010  FINDINGS: Mediastinum/Nodes: Heart is normal in size. No pericardial effusion.  Coronary atherosclerosis.  Atherosclerotic calcifications of the aortic arch.  Thoracic lymphadenopathy, including:  --10 mm short axis left supraclavicular node (series 2/ image 5)  --12 mm short axis right paratracheal node (series 2/ image 18)  --13 mm short axis low right paratracheal node (series 2/ image 22)  --11 mm short axis subcarinal node (series 2/ image 24)  --13 mm short axis right hilar node (series 2/ image 24)  --14 mm short axis azygoesophageal recess node (series 2/image 20)  Lungs/Pleura: 3.0 x 2.9 x 2.9 cm spiculated right upper lobe mass (series 3/image 18), corresponding to the previous 2.1 x 1.7 cm subsolid/ground-glass nodule, worrisome for primary bronchogenic neoplasm. Associated pleural involvement anteriorly.  Adjacent satellite nodularity (series 3/ image 18). Additional 2.2 cm sub solid/ ground-glass nodule in the superior right lower lobe (series 3/ image 22), previously reflecting a  1.5 cm ground-glass nodule, worrisome for additional focus of neoplasm.  Multifocal tree-in-bud nodules in the bilateral lower lobes (series 3/ image 39), possibly infectious/ inflammatory.  Underlying mild emphysematous changes.  Radiation changes in the anterior right upper lobe.  Small right pleural effusion.  No pneumothorax.  Upper abdomen: Visualized upper abdomen is notable for  a 2.4 cm lesion in the right liver (series 2/ image 48), worrisome for metastasis.  Additionally present is a 7 mm nonobstructing right renal calculus (series 2/ image 49), an 11.6 cm left renal cyst (series 2/ image 54), small right renal cysts, and vascular calcifications.  Musculoskeletal: Degenerative changes of the visualized thoracolumbar spine.  IMPRESSION: 3.0 cm spiculated right upper lobe mass, increased, worrisome for primary bronchogenic neoplasm. Associated pleural/anterior chest wall involvement. Additional right upper lobe satellite nodularity.  2.2 cm subsolid/ground-glass nodule in the superior right lower lobe, increased, worrisome for synchronous neoplasm.  Multifocal tree-in-bud nodules in the bilateral lower lobes, possibly infectious/ inflammatory.  Small right pleural effusion.  Left supraclavicular, mediastinal, and right hilar nodal metastases.  2.4 cm lesion in the right liver, worrisome for metastasis.   Electronically Signed   By: Julian Hy M.D.   On: 07/29/2014 13:48   Ct Angio Chest Pe W/cm &/or Wo Cm  08/18/2014   CLINICAL DATA:  LEFT chest pain with cough starting this morning, history of lung cancer, RIGHT breast cancer, hypertension, stroke, former smoker  EXAM: CT ANGIOGRAPHY CHEST WITH CONTRAST  TECHNIQUE: Multidetector CT imaging of the chest was performed using the standard protocol during bolus administration of intravenous contrast. Multiplanar CT image reconstructions and MIPs were obtained to evaluate the vascular anatomy.  CONTRAST:  28m OMNIPAQUE IOHEXOL 350 MG/ML SOLN IV  COMPARISON:  07/29/2014  FINDINGS: Scattered atherosclerotic calcifications aorta, proximal great vessels, and coronary arteries.  No aortic aneurysm or dissection.  Pulmonary arteries well opacified with minimal dilatation of central pulmonary arteries.  Pulmonary arteries patent without evidence of pulmonary embolism.  Large cyst upper pole LEFT kidney 11.3 x 9.8 cm.  Multiple small RIGHT  renal cyst.  Moderate-sized hiatal hernia.  Vague area of low attenuation lateral RIGHT lobe liver 3.3 cm greatest size corresponding to liver lesion which was previously biopsied.  Remaining visualized portions of upper abdomen unremarkable.  Enlarged RIGHT paratracheal lymph node 14 mm short axis image 35 previously 12 mm.  Enlarged precarinal lymph node 13 mm short axis image 47 unchanged.  Adenopathy at superior aspect of RIGHT pulmonary hilum unchanged, 14 mm short axis image 47.  RIGHT upper lobe mass anteriorly compatible with neoplasm, appears larger than on previous exam though this could be overestimated due to adjacent atelectasis, currently measuring 3.6 x 2.9 cm previously 3.0 x 2.9 cm.  Focus of infiltrate versus non solid opacity in RIGHT upper lobe more inferolaterally appears stable.  Additional stellate semi solid opacity in RIGHT lower lobe 2.5 cm diameter image 62 little changed.  Patchy sub solid, solid, and nodular infiltrates in RIGHT lower lobe again identified.  Subtle areas of non solid opacity in the LEFT lung are also again identified, grossly similar to previous exam no additional discrete mass or nodule identified.  Tiny RIGHT pleural effusion.  No pneumothorax.  Thoracolumbar dextro convex scoliosis with rotatory component.  Osseous demineralization.  Review of the MIP images confirms the above findings.  IMPRESSION: No evidence of pulmonary embolism.  Mild dilatation of the central pulmonary arteries raising question of pulmonary arterial hypertension.  RIGHT upper lobe mass compatible with neoplasm  question minimally larger than on previous exam versus overestimation due to adjacent atelectasis which appears increased.  2.5 cm diameter stellate semi solid opacity RIGHT lower lobe question additional tumor.  Multiple additional solid, semi solid and non solid opacities in both lungs may represent inflammatory process though additional tumor not completely excluded, recommend attention  on follow-up exams ; some with the more nodular appearing opacities in the RIGHT lower lobe have decreased in size since the previous exam.  Renal cysts including an 11.3 cm cyst at the upper pole of the LEFT kidney.  Small hiatal hernia.   Electronically Signed   By: Lavonia Dana M.D.   On: 08/18/2014 17:26   Nm Pet Image Initial (pi) Skull Base To Thigh  08/15/2014   CLINICAL DATA:  Subsequent treatment strategy for liver lesion biopsied on 08/12/2014. History of right breast cancer in 2013 with last radiation therapy in 2014.  EXAM: NUCLEAR MEDICINE PET SKULL BASE TO THIGH  TECHNIQUE: 12.42 mCi F-18 FDG was injected intravenously. Full-ring PET imaging was performed from the skull base to thigh after the radiotracer. CT data was obtained and used for attenuation correction and anatomic localization.  FASTING BLOOD GLUCOSE:  Value: 79 mg/dl  COMPARISON:  Abdominal CT 08/12/2014.  Chest CT 07/29/2014.  FINDINGS: NECK  There are 2 adjacent left supraclavicular lymph nodes which are hypermetabolic. The larger of these measures 10 mm short axis on image 66 and has an SUV max of 4.1. No other hypermetabolic cervical lymph nodes demonstrated.There are no lesions of the pharyngeal mucosal space.  CHEST  The enlarged right paratracheal and right hilar lymph nodes on recent CT are hypermetabolic. The largest right paratracheal node measures 1.7 cm short axis on image 90 and has an SUV max of 10.0. No hypermetabolic subcarinal, left hilar or axillary adenopathy. The dominant spiculated right upper lobe mass is now partly obscured by atelectasis, but measures approximately 3.3 x 3.1 cm on image 84 and is hypermetabolic with an SUV max of 15.9. The other smaller ground-glass opacities in the right upper lobe (image 84) and in the superior segment of the right lower lobe (image 96) also demonstrate low-level hypermetabolic activity (SUV max 2.1). There is a 9 mm focal ground-glass opacity in the left upper lobe on image 84  which also demonstrates low-level activity. There is increased patchy airspace opacity dependently in the right lower lobe with associated low level metabolic activity, likely inflammatory.  ABDOMEN/PELVIS  The recently biopsied dominant lesion in the right hepatic lobe is hypermetabolic. This lesion measures approximately 3.5 cm on image 128 and has an SUV max of 10.6. There are at least 2 smaller adjacent hypermetabolic metastases within the right hepatic lobe. There is no suspicious activity within the spleen, pancreas, adrenal glands or kidneys. There is low-level hypermetabolic activity within a single lymph node in the porta hepatis (SUV max 4.0). There is no other hypermetabolic nodal activity. There are low-density adnexal lesions bilaterally status post hysterectomy without associated abnormal metabolic activity. There are multiple low-density renal lesions, including a dominant left renal cyst without suspicious metabolic activity. Nonobstructing calculus noted in the upper pole of the right kidney, diffuse atherosclerosis and sigmoid diverticulosis noted.  SKELETON  There is a single hypermetabolic osseous metastasis within the right ischium. This is lytic and has an SUV max of 10.2. Low-level activity anteriorly at C1-2 is likely inflammatory. There is a moderate thoracolumbar scoliosis.  IMPRESSION: 1. The dominant right upper lobe pulmonary mass is hypermetabolic, most consistent with  bronchogenic carcinoma. There are associated left supraclavicular, right hilar and mediastinal nodal metastases, at least 3 hepatic metastases, and a right ischial nodal metastasis. 2. There are additional ground-glass opacities in both lungs which may represent synchronous adenocarcinoma. 3. Increased patchy dependent opacity in the right lower lobe, likely inflammatory.   Electronically Signed   By: Richardean Sale M.D.   On: 08/15/2014 12:23   Ct Biopsy  08/12/2014   CLINICAL DATA:  Right hepatic mass.  EXAM: CT  GUIDED core BIOPSY OF right hepatic lobe mass.  ANESTHESIA/SEDATION: 1.5  Mg IV Versed; 100 mcg IV Fentanyl  Total Moderate Sedation Time: 40 minutes.  PROCEDURE: The procedure risks, benefits, and alternatives were explained to the patient, including pneumothorax, bleeding and infection. Questions regarding the procedure were encouraged and answered. The patient understands and consents to the procedure.  The right upper quadrant of the abdomen was prepped with chlorhexidinein a sterile fashion, and a sterile drape was applied covering the operative field. Sterile gloves were used for the procedure. Local anesthesia was provided with 1% Lidocaine.  Under CT guidance, 17 gauge guiding needle was directed toward lesion in superior and posterior portion of right hepatic lobe. Four core samples were obtained using 18 gauge biopsy needle. Needle was then removed with the simultaneous injection of Gel-Foam slurry. Appropriate dressing was applied.  Complications: None immediate noted on post biopsy imaging.  FINDINGS: Right hepatic mass as described on prior studies.  IMPRESSION: Under CT guidance, percutaneous core biopsy was performed of right hepatic lobe lesion concerning for metastasis.   Electronically Signed   By: Marijo Conception, M.D.   On: 08/12/2014 11:56    ASSESSMENT: CT scan of the chest and abdomen revealed multiple lung nodule and liver nodule. In September 2013 patient had a right breast cancer triple negative disease T1 cN0 M0 tumor status post resection and radiation therapy in no further adjuvant chemotherapy PET scan has been reviewed Right upper lobe lung mass mediastinal adenopathy and multiple liver metastases Biopsy of liver mass is pending Discussed situation with pathologist there waiting for final staining report to come back 2/acute shortness of breath We will start patient on steroids. Nebulizer with yduenab.  Patient was advised admission but she did not want to go to  Hospital Discussed options of treatment  PLAN:   Acute onset of shortness of breath appears to be bronchospasm.  Possibility of pulmonary embolism cannot be ruled out  Patient was given a inhalation treatment.  Patient was reexamined mean bronchospasm is improved.  Patient was advised to go in by her inhaler with Symbicort continue prednisone therapy use albuterol as a rescue inhaler.  CT scan was done to rule out pulmonary embolism and that has been reviewed independently there is no evidence of pulmonary embolism. Patient was advised to go to the emergency room here.  Again gets short of breath patient may need aggressive inhalation therapy Further workup for malignancy with the guidance study in markers have been ordered. CA 19-9    673.00 Patient received intravenous Solu-Medrol and nebulizer treatment in our infusion center HIGH  CA 19-9 raises possibility of cholangiocarcinoma   Breast CA   Staging form: Breast, AJCC 7th Edition     Clinical: Stage IA (T1c, N0, M0) - Marni Griffon, MD   08/27/2014 10:05 AM

## 2014-08-30 ENCOUNTER — Inpatient Hospital Stay: Payer: PPO | Attending: Oncology | Admitting: Oncology

## 2014-08-30 ENCOUNTER — Encounter: Payer: Self-pay | Admitting: Oncology

## 2014-08-30 VITALS — BP 121/61 | HR 85 | Temp 98.2°F | Wt 155.2 lb

## 2014-08-30 DIAGNOSIS — K449 Diaphragmatic hernia without obstruction or gangrene: Secondary | ICD-10-CM | POA: Insufficient documentation

## 2014-08-30 DIAGNOSIS — R531 Weakness: Secondary | ICD-10-CM | POA: Diagnosis not present

## 2014-08-30 DIAGNOSIS — R0602 Shortness of breath: Secondary | ICD-10-CM | POA: Diagnosis present

## 2014-08-30 DIAGNOSIS — Z853 Personal history of malignant neoplasm of breast: Secondary | ICD-10-CM

## 2014-08-30 DIAGNOSIS — F329 Major depressive disorder, single episode, unspecified: Secondary | ICD-10-CM | POA: Insufficient documentation

## 2014-08-30 DIAGNOSIS — Z923 Personal history of irradiation: Secondary | ICD-10-CM | POA: Diagnosis not present

## 2014-08-30 DIAGNOSIS — M899 Disorder of bone, unspecified: Secondary | ICD-10-CM

## 2014-08-30 DIAGNOSIS — Z7952 Long term (current) use of systemic steroids: Secondary | ICD-10-CM | POA: Diagnosis not present

## 2014-08-30 DIAGNOSIS — R5383 Other fatigue: Secondary | ICD-10-CM | POA: Diagnosis not present

## 2014-08-30 DIAGNOSIS — R1010 Upper abdominal pain, unspecified: Secondary | ICD-10-CM | POA: Insufficient documentation

## 2014-08-30 DIAGNOSIS — R05 Cough: Secondary | ICD-10-CM

## 2014-08-30 DIAGNOSIS — K769 Liver disease, unspecified: Secondary | ICD-10-CM | POA: Diagnosis not present

## 2014-08-30 DIAGNOSIS — J449 Chronic obstructive pulmonary disease, unspecified: Secondary | ICD-10-CM | POA: Diagnosis present

## 2014-08-30 DIAGNOSIS — N281 Cyst of kidney, acquired: Secondary | ICD-10-CM | POA: Insufficient documentation

## 2014-08-30 DIAGNOSIS — Z79899 Other long term (current) drug therapy: Secondary | ICD-10-CM | POA: Diagnosis not present

## 2014-08-30 DIAGNOSIS — I700 Atherosclerosis of aorta: Secondary | ICD-10-CM

## 2014-08-30 DIAGNOSIS — R630 Anorexia: Secondary | ICD-10-CM | POA: Diagnosis not present

## 2014-08-30 DIAGNOSIS — R918 Other nonspecific abnormal finding of lung field: Secondary | ICD-10-CM

## 2014-08-30 DIAGNOSIS — K5730 Diverticulosis of large intestine without perforation or abscess without bleeding: Secondary | ICD-10-CM | POA: Diagnosis not present

## 2014-08-30 DIAGNOSIS — Z87891 Personal history of nicotine dependence: Secondary | ICD-10-CM | POA: Diagnosis not present

## 2014-08-30 MED ORDER — PREDNISONE 20 MG PO TABS: 20.0000 mg | ORAL_TABLET | Freq: Every day | ORAL | Status: DC

## 2014-08-30 NOTE — Progress Notes (Signed)
Patient does have living will.  Former smoker.  No appetite.  States she is feeling no better.  SOB - cannot walk across the floor without wheezing. Bilateral lower extremity edema.

## 2014-08-30 NOTE — Progress Notes (Signed)
choksi requested to call in prednisone 20 mg once daily for 30 days no refills. It was called in.

## 2014-08-31 ENCOUNTER — Emergency Department: Payer: PPO

## 2014-08-31 ENCOUNTER — Inpatient Hospital Stay (HOSPITAL_COMMUNITY)
Admit: 2014-08-31 | Discharge: 2014-08-31 | Disposition: A | Discharge status: Home / Self Care | Payer: PPO | Attending: Internal Medicine | Admitting: Internal Medicine

## 2014-08-31 ENCOUNTER — Encounter: Payer: Self-pay | Admitting: Emergency Medicine

## 2014-08-31 ENCOUNTER — Inpatient Hospital Stay
Admission: EM | Admit: 2014-08-31 | Discharge: 2014-09-08 | DRG: 190 | Disposition: A | Discharge status: Skilled Nursing Facility | Payer: PPO | Attending: Internal Medicine | Admitting: Internal Medicine

## 2014-08-31 DIAGNOSIS — J40 Bronchitis, not specified as acute or chronic: Secondary | ICD-10-CM | POA: Diagnosis present

## 2014-08-31 DIAGNOSIS — K219 Gastro-esophageal reflux disease without esophagitis: Secondary | ICD-10-CM | POA: Diagnosis present

## 2014-08-31 DIAGNOSIS — C787 Secondary malignant neoplasm of liver and intrahepatic bile duct: Secondary | ICD-10-CM | POA: Diagnosis present

## 2014-08-31 DIAGNOSIS — Z9049 Acquired absence of other specified parts of digestive tract: Secondary | ICD-10-CM | POA: Diagnosis present

## 2014-08-31 DIAGNOSIS — Z791 Long term (current) use of non-steroidal anti-inflammatories (NSAID): Secondary | ICD-10-CM | POA: Diagnosis not present

## 2014-08-31 DIAGNOSIS — Z6829 Body mass index (BMI) 29.0-29.9, adult: Secondary | ICD-10-CM

## 2014-08-31 DIAGNOSIS — J189 Pneumonia, unspecified organism: Secondary | ICD-10-CM | POA: Diagnosis not present

## 2014-08-31 DIAGNOSIS — C3491 Malignant neoplasm of unspecified part of right bronchus or lung: Secondary | ICD-10-CM

## 2014-08-31 DIAGNOSIS — Z888 Allergy status to other drugs, medicaments and biological substances status: Secondary | ICD-10-CM | POA: Diagnosis not present

## 2014-08-31 DIAGNOSIS — C3410 Malignant neoplasm of upper lobe, unspecified bronchus or lung: Secondary | ICD-10-CM | POA: Diagnosis present

## 2014-08-31 DIAGNOSIS — E440 Moderate protein-calorie malnutrition: Secondary | ICD-10-CM | POA: Diagnosis present

## 2014-08-31 DIAGNOSIS — Z823 Family history of stroke: Secondary | ICD-10-CM | POA: Diagnosis not present

## 2014-08-31 DIAGNOSIS — Z923 Personal history of irradiation: Secondary | ICD-10-CM | POA: Diagnosis not present

## 2014-08-31 DIAGNOSIS — I340 Nonrheumatic mitral (valve) insufficiency: Secondary | ICD-10-CM | POA: Diagnosis present

## 2014-08-31 DIAGNOSIS — Z7951 Long term (current) use of inhaled steroids: Secondary | ICD-10-CM

## 2014-08-31 DIAGNOSIS — Z8673 Personal history of transient ischemic attack (TIA), and cerebral infarction without residual deficits: Secondary | ICD-10-CM

## 2014-08-31 DIAGNOSIS — Z87891 Personal history of nicotine dependence: Secondary | ICD-10-CM

## 2014-08-31 DIAGNOSIS — R0600 Dyspnea, unspecified: Secondary | ICD-10-CM

## 2014-08-31 DIAGNOSIS — Z792 Long term (current) use of antibiotics: Secondary | ICD-10-CM | POA: Diagnosis not present

## 2014-08-31 DIAGNOSIS — I480 Paroxysmal atrial fibrillation: Secondary | ICD-10-CM | POA: Diagnosis present

## 2014-08-31 DIAGNOSIS — R0602 Shortness of breath: Secondary | ICD-10-CM

## 2014-08-31 DIAGNOSIS — G629 Polyneuropathy, unspecified: Secondary | ICD-10-CM | POA: Diagnosis present

## 2014-08-31 DIAGNOSIS — I10 Essential (primary) hypertension: Secondary | ICD-10-CM | POA: Diagnosis present

## 2014-08-31 DIAGNOSIS — Z6830 Body mass index (BMI) 30.0-30.9, adult: Secondary | ICD-10-CM | POA: Diagnosis not present

## 2014-08-31 DIAGNOSIS — I509 Heart failure, unspecified: Secondary | ICD-10-CM | POA: Diagnosis present

## 2014-08-31 DIAGNOSIS — J9621 Acute and chronic respiratory failure with hypoxia: Secondary | ICD-10-CM | POA: Diagnosis not present

## 2014-08-31 DIAGNOSIS — J441 Chronic obstructive pulmonary disease with (acute) exacerbation: Principal | ICD-10-CM | POA: Diagnosis present

## 2014-08-31 DIAGNOSIS — R000 Tachycardia, unspecified: Secondary | ICD-10-CM | POA: Diagnosis not present

## 2014-08-31 DIAGNOSIS — C3490 Malignant neoplasm of unspecified part of unspecified bronchus or lung: Secondary | ICD-10-CM | POA: Diagnosis not present

## 2014-08-31 DIAGNOSIS — J9600 Acute respiratory failure, unspecified whether with hypoxia or hypercapnia: Secondary | ICD-10-CM | POA: Diagnosis present

## 2014-08-31 DIAGNOSIS — E785 Hyperlipidemia, unspecified: Secondary | ICD-10-CM | POA: Diagnosis present

## 2014-08-31 DIAGNOSIS — I214 Non-ST elevation (NSTEMI) myocardial infarction: Secondary | ICD-10-CM

## 2014-08-31 DIAGNOSIS — Z808 Family history of malignant neoplasm of other organs or systems: Secondary | ICD-10-CM

## 2014-08-31 DIAGNOSIS — I700 Atherosclerosis of aorta: Secondary | ICD-10-CM | POA: Diagnosis present

## 2014-08-31 DIAGNOSIS — Z171 Estrogen receptor negative status [ER-]: Secondary | ICD-10-CM | POA: Diagnosis not present

## 2014-08-31 DIAGNOSIS — Z886 Allergy status to analgesic agent status: Secondary | ICD-10-CM

## 2014-08-31 DIAGNOSIS — Z79899 Other long term (current) drug therapy: Secondary | ICD-10-CM | POA: Diagnosis not present

## 2014-08-31 DIAGNOSIS — Z880 Allergy status to penicillin: Secondary | ICD-10-CM

## 2014-08-31 DIAGNOSIS — D649 Anemia, unspecified: Secondary | ICD-10-CM | POA: Diagnosis present

## 2014-08-31 DIAGNOSIS — Z8249 Family history of ischemic heart disease and other diseases of the circulatory system: Secondary | ICD-10-CM

## 2014-08-31 DIAGNOSIS — C771 Secondary and unspecified malignant neoplasm of intrathoracic lymph nodes: Secondary | ICD-10-CM | POA: Diagnosis not present

## 2014-08-31 DIAGNOSIS — A419 Sepsis, unspecified organism: Secondary | ICD-10-CM | POA: Diagnosis not present

## 2014-08-31 DIAGNOSIS — J90 Pleural effusion, not elsewhere classified: Secondary | ICD-10-CM | POA: Diagnosis not present

## 2014-08-31 DIAGNOSIS — Z853 Personal history of malignant neoplasm of breast: Secondary | ICD-10-CM

## 2014-08-31 DIAGNOSIS — Z87442 Personal history of urinary calculi: Secondary | ICD-10-CM | POA: Diagnosis not present

## 2014-08-31 DIAGNOSIS — R059 Cough, unspecified: Secondary | ICD-10-CM

## 2014-08-31 DIAGNOSIS — R05 Cough: Secondary | ICD-10-CM

## 2014-08-31 DIAGNOSIS — C779 Secondary and unspecified malignant neoplasm of lymph node, unspecified: Secondary | ICD-10-CM | POA: Diagnosis present

## 2014-08-31 HISTORY — DX: Malignant neoplasm of unspecified part of unspecified bronchus or lung: C34.90

## 2014-08-31 LAB — CBC WITH DIFFERENTIAL/PLATELET
Basophils Absolute: 0 10*3/uL (ref 0–0.1)
Basophils Relative: 0 %
EOS PCT: 0 %
Eosinophils Absolute: 0 10*3/uL (ref 0–0.7)
HCT: 28.7 % — ABNORMAL LOW (ref 35.0–47.0)
Hemoglobin: 9.2 g/dL — ABNORMAL LOW (ref 12.0–16.0)
LYMPHS ABS: 0.3 10*3/uL — AB (ref 1.0–3.6)
LYMPHS PCT: 3 %
MCH: 27.1 pg (ref 26.0–34.0)
MCHC: 32 g/dL (ref 32.0–36.0)
MCV: 84.7 fL (ref 80.0–100.0)
MONO ABS: 0.1 10*3/uL — AB (ref 0.2–0.9)
Monocytes Relative: 1 %
Neutro Abs: 9.2 10*3/uL — ABNORMAL HIGH (ref 1.4–6.5)
Neutrophils Relative %: 96 %
Platelets: 410 10*3/uL (ref 150–440)
RBC: 3.39 MIL/uL — AB (ref 3.80–5.20)
RDW: 16.5 % — AB (ref 11.5–14.5)
WBC: 9.6 10*3/uL (ref 3.6–11.0)

## 2014-08-31 LAB — COMPREHENSIVE METABOLIC PANEL
ALBUMIN: 2.7 g/dL — AB (ref 3.5–5.0)
ALK PHOS: 69 U/L (ref 38–126)
ALT: 26 U/L (ref 14–54)
ANION GAP: 12 (ref 5–15)
AST: 31 U/L (ref 15–41)
BUN: 17 mg/dL (ref 6–20)
CHLORIDE: 94 mmol/L — AB (ref 101–111)
CO2: 23 mmol/L (ref 22–32)
Calcium: 8.7 mg/dL — ABNORMAL LOW (ref 8.9–10.3)
Creatinine, Ser: 1.17 mg/dL — ABNORMAL HIGH (ref 0.44–1.00)
GFR calc Af Amer: 48 mL/min — ABNORMAL LOW (ref >60)
GFR calc non Af Amer: 42 mL/min — ABNORMAL LOW (ref >60)
Glucose, Bld: 167 mg/dL — ABNORMAL HIGH (ref 65–99)
POTASSIUM: 3.4 mmol/L — AB (ref 3.5–5.1)
Sodium: 129 mmol/L — ABNORMAL LOW (ref 135–145)
Total Bilirubin: 0.4 mg/dL (ref 0.3–1.2)
Total Protein: 6.1 g/dL — ABNORMAL LOW (ref 6.5–8.1)

## 2014-08-31 LAB — TROPONIN I
Troponin I: 0.34 ng/mL — ABNORMAL HIGH (ref <0.031)
Troponin I: 0.34 ng/mL — ABNORMAL HIGH (ref <0.031)

## 2014-08-31 LAB — BRAIN NATRIURETIC PEPTIDE: B Natriuretic Peptide: 733 pg/mL — ABNORMAL HIGH (ref 0.0–100.0)

## 2014-08-31 MED ORDER — HEPARIN SODIUM (PORCINE) 5000 UNIT/ML IJ SOLN
5000.0000 [IU] | Freq: Three times a day (TID) | INTRAMUSCULAR | Status: DC
  Administered 2014-08-31 – 2014-09-08 (×21): 5000 [IU] via SUBCUTANEOUS
  Filled 2014-08-31 (×21): qty 1

## 2014-08-31 MED ORDER — VITAMIN B-12 100 MCG PO TABS
100.0000 ug | ORAL_TABLET | Freq: Every day | ORAL | Status: DC
Start: 2014-08-31 — End: 2014-09-08
  Administered 2014-09-01 – 2014-09-08 (×8): 100 ug via ORAL
  Filled 2014-08-31 (×8): qty 1

## 2014-08-31 MED ORDER — MAGNESIUM SULFATE 2 GM/50ML IV SOLN
2.0000 g | Freq: Once | INTRAVENOUS | Status: AC
  Administered 2014-08-31: 2 g via INTRAVENOUS
  Filled 2014-08-31: qty 50

## 2014-08-31 MED ORDER — LEVOFLOXACIN IN D5W 750 MG/150ML IV SOLN
750.0000 mg | Freq: Once | INTRAVENOUS | Status: AC
  Administered 2014-08-31: 750 mg via INTRAVENOUS
  Filled 2014-08-31: qty 150

## 2014-08-31 MED ORDER — PREDNISONE 20 MG PO TABS
20.0000 mg | ORAL_TABLET | Freq: Every day | ORAL | Status: DC
  Administered 2014-09-01 – 2014-09-08 (×8): 20 mg via ORAL
  Filled 2014-08-31 (×8): qty 1

## 2014-08-31 MED ORDER — PANTOPRAZOLE SODIUM 40 MG PO TBEC
40.0000 mg | DELAYED_RELEASE_TABLET | Freq: Every day | ORAL | Status: DC
  Administered 2014-09-01 – 2014-09-08 (×8): 40 mg via ORAL
  Filled 2014-08-31 (×8): qty 1

## 2014-08-31 MED ORDER — IPRATROPIUM-ALBUTEROL 0.5-2.5 (3) MG/3ML IN SOLN
RESPIRATORY_TRACT | Status: AC
  Filled 2014-08-31: qty 6

## 2014-08-31 MED ORDER — ENALAPRIL MALEATE 20 MG PO TABS
20.0000 mg | ORAL_TABLET | Freq: Every day | ORAL | Status: DC
  Administered 2014-09-01 – 2014-09-08 (×8): 20 mg via ORAL
  Filled 2014-08-31: qty 1
  Filled 2014-08-31 (×2): qty 2
  Filled 2014-08-31: qty 1
  Filled 2014-08-31 (×4): qty 2

## 2014-08-31 MED ORDER — FUROSEMIDE 10 MG/ML IJ SOLN
20.0000 mg | Freq: Once | INTRAMUSCULAR | Status: AC
  Administered 2014-08-31: 20 mg via INTRAVENOUS
  Filled 2014-08-31: qty 4

## 2014-08-31 MED ORDER — CLOPIDOGREL BISULFATE 75 MG PO TABS
75.0000 mg | ORAL_TABLET | Freq: Every day | ORAL | Status: DC
  Administered 2014-09-01 – 2014-09-08 (×8): 75 mg via ORAL
  Filled 2014-08-31 (×8): qty 1

## 2014-08-31 MED ORDER — FUROSEMIDE 10 MG/ML IJ SOLN
20.0000 mg | Freq: Two times a day (BID) | INTRAMUSCULAR | Status: DC
  Administered 2014-09-01 – 2014-09-02 (×3): 20 mg via INTRAVENOUS
  Filled 2014-08-31 (×3): qty 2

## 2014-08-31 MED ORDER — IPRATROPIUM-ALBUTEROL 0.5-2.5 (3) MG/3ML IN SOLN: 3.0000 mL | Freq: Once | RESPIRATORY_TRACT | Status: DC

## 2014-08-31 MED ORDER — IPRATROPIUM-ALBUTEROL 0.5-2.5 (3) MG/3ML IN SOLN
3.0000 mL | RESPIRATORY_TRACT | Status: DC
  Administered 2014-08-31 – 2014-09-02 (×9): 3 mL via RESPIRATORY_TRACT
  Filled 2014-08-31 (×10): qty 3

## 2014-08-31 MED ORDER — IPRATROPIUM-ALBUTEROL 0.5-2.5 (3) MG/3ML IN SOLN: 3.0000 mL | Freq: Once | RESPIRATORY_TRACT | Status: AC

## 2014-08-31 NOTE — ED Provider Notes (Signed)
[facility] Emergency Department Provider Note  ____________________________________________  Time seen: Approximately 4:49 PM  I have reviewed the triage vital signs and the nursing notes.   HISTORY  Chief Complaint Shortness of Breath    HPI Jo Frank is a 81 y.o. female with hypertension, COPD, breast cancer who presents for evaluation of several days increased shortness of breath. She was seen by Dr. Oliva Bustard yesterday and started on home oxygen however she reports continued worsening shortness of breath. No chest pain. No fevers. She has had cough productive of yellowish sputum. Symptoms have been intermittent since onset, gradual onset. Current severity of symptoms is moderate to severe. On EMS arrival, she received a DuoNeb treatment as well as 125 mg of Solu-Medrol with improvement of her symptoms. She denies fevers.   Past Medical History  Diagnosis Date  . Hypertension   . CVA (cerebral infarction) 2010    Was on Plavix for 2 years, then switched to clopidogrel but d/c due to side effects ("sick")  . Nephrolithiasis     per patient, passes on her own  . Stroke   . COPD (chronic obstructive pulmonary disease)   . GERD (gastroesophageal reflux disease)   . Breast cancer 2013    breast cancer right-radiation-lumpectomy    Patient Active Problem List   Diagnosis Date Noted  . Abdominal pain 08/12/2014  . Absolute anemia 08/08/2014  . Back pain, chronic 08/08/2014  . Breast CA 08/08/2014  . CAFL (chronic airflow limitation) 08/08/2014  . Acid reflux 08/08/2014  . Personal history of transient ischemic attack (TIA) and cerebral infarction without residual deficit 08/08/2014  . HLD (hyperlipidemia) 08/08/2014  . Low serum cobalamin 08/08/2014  . Amnesia 08/08/2014  . Syncope 12/06/2012  . Hypertension 12/06/2012    Past Surgical History  Procedure Laterality Date  . Breast lumpectomy      left side  . Abdominal hysterectomy  1960s     "bleeding profusely"  . Cholecystectomy    . Hand surgery      s/p fall    Current Outpatient Rx  Name  Route  Sig  Dispense  Refill  . acetaminophen (TYLENOL) 500 MG tablet   Oral   Take 1,000 mg by mouth every 6 (six) hours as needed for mild pain or headache.          . albuterol (PROVENTIL HFA;VENTOLIN HFA) 108 (90 BASE) MCG/ACT inhaler   Inhalation   Inhale 1-2 puffs into the lungs every 4 (four) hours as needed for wheezing or shortness of breath.   1 Inhaler   3   . azithromycin (ZITHROMAX) 500 MG tablet   Oral   Take 1 tablet (500 mg total) by mouth daily.   5 tablet   0   . benzonatate (TESSALON) 100 MG capsule   Oral   Take 1 capsule (100 mg total) by mouth 3 (three) times daily.   30 capsule   0   . budesonide-formoterol (SYMBICORT) 160-4.5 MCG/ACT inhaler   Inhalation   Inhale 2 puffs into the lungs 2 (two) times daily.         . budesonide-formoterol (SYMBICORT) 160-4.5 MCG/ACT inhaler   Inhalation   Inhale into the lungs.         . clopidogrel (PLAVIX) 75 MG tablet   Oral   Take 1 tablet (75 mg total) by mouth daily with breakfast.   90 tablet   0   . Cyanocobalamin (VITAMIN B-12 PO)   Oral  Take 1 tablet by mouth daily.          . enalapril (VASOTEC) 20 MG tablet   Oral   Take 20 mg by mouth daily.         Marland Kitchen esomeprazole (NEXIUM) 20 MG capsule   Oral   Take 20-40 mg by mouth daily as needed (for indigestion/heartburn).          Marland Kitchen ipratropium-albuterol (DUONEB) 0.5-2.5 (3) MG/3ML SOLN   Nebulization   Take 3 mLs by nebulization 3 (three) times daily as needed. Dx: chronic COPD   360 mL   3   . Oxymetazoline HCl ([employer] NASAL SPRAY NA)   Nasal   Place 1 spray into the nose at bedtime as needed (for congestion).          . predniSONE (DELTASONE) 20 MG tablet   Oral   Take 1 tablet (20 mg total) by mouth daily with breakfast.   30 tablet   1     Allergies Penicillins; Aspirin; and Nitrofuran derivatives  Family  History  Problem Relation Age of Onset  . Throat cancer Mother     heavy smoker & drinker, died at 79 yo (1978)  . Heart failure Father   . Hypertension Sister   . Stroke Sister     Social History History  Substance Use Topics  . Smoking status: Former Smoker -- 0.50 packs/day for 30 years    Types: Cigarettes    Quit date: 12/07/1978  . Smokeless tobacco: Never Used  . Alcohol Use: No    Review of Systems Constitutional: No fever/chills Eyes: No visual changes. ENT: No sore throat. Cardiovascular: Denies chest pain. Respiratory: +shortness of breath. Gastrointestinal: No abdominal pain.  No nausea, no vomiting.  No diarrhea.  No constipation. Genitourinary: Negative for dysuria. Musculoskeletal: Negative for back pain. Skin: Negative for rash. Neurological: Negative for headaches, focal weakness or numbness.  10-point ROS otherwise negative.  ____________________________________________   PHYSICAL EXAM:  VITAL SIGNS: ED Triage Vitals  Enc Vitals Group     BP 08/31/14 1640 175/73 mmHg     Pulse Rate 08/31/14 1640 115     Resp 08/31/14 1640 26     Temp 08/31/14 1640 97.8 F (36.6 C)     Temp Source 08/31/14 1640 Oral     SpO2 08/31/14 1640 96 %     Weight 08/31/14 1640 163 lb (73.936 kg)     Height 08/31/14 1640 '5\' 1"'$  (1.549 m)     Head Cir --      Peak Flow --      Pain Score --      Pain Loc --      Pain Edu? --      Excl. in [place]? --     Constitutional: Alert and oriented. In moderate to severe respiratory distress. Eyes: Conjunctivae are normal. PERRL. EOMI. Head: Atraumatic. Nose: No congestion/rhinnorhea. Mouth/Throat: Mucous membranes are moist.  Oropharynx non-erythematous. Neck: No stridor. Cardiovascular: tachycardic rate, regular rhythm. Grossly normal heart sounds.  Good peripheral circulation. Respiratory: Tachypnea, increased work of breathing, prolonged expiratory phase, diffuse expiratory wheeze. Gastrointestinal: Soft and nontender. No  distention. No abdominal bruits. No CVA tenderness. Genitourinary: deferred Musculoskeletal: 2+ pitting edema bilateral lower extremities. No joint effusions. Neurologic:  Normal speech and language. No gross focal neurologic deficits are appreciated. No gait instability. Skin:  Skin is warm, dry and intact. No rash noted. Psychiatric: Mood and affect are normal. Speech and behavior are normal.  ____________________________________________  LABS (all labs ordered are listed, but only abnormal results are displayed)  Labs Reviewed  CBC WITH DIFFERENTIAL/PLATELET - Abnormal; Notable for the following:    RBC 3.39 (*)    Hemoglobin 9.2 (*)    HCT 28.7 (*)    RDW 16.5 (*)    Neutro Abs 9.2 (*)    Lymphs Abs 0.3 (*)    Monocytes Absolute 0.1 (*)    All other components within normal limits  COMPREHENSIVE METABOLIC PANEL - Abnormal; Notable for the following:    Sodium 129 (*)    Potassium 3.4 (*)    Chloride 94 (*)    Glucose, Bld 167 (*)    Creatinine, Ser 1.17 (*)    Calcium 8.7 (*)    Total Protein 6.1 (*)    Albumin 2.7 (*)    GFR calc non Af Amer 42 (*)    GFR calc Af Amer 48 (*)    All other components within normal limits  TROPONIN I - Abnormal; Notable for the following:    Troponin I 0.34 (*)    All other components within normal limits  BRAIN NATRIURETIC PEPTIDE - Abnormal; Notable for the following:    B Natriuretic Peptide 733.0 (*)    All other components within normal limits  CULTURE, BLOOD (ROUTINE X 2)  CULTURE, BLOOD (ROUTINE X 2)   ____________________________________________  EKG  ED ECG REPORT I, Joanne Gavel, the attending physician, personally viewed and interpreted this ECG.   Date: 08/31/2014  EKG Time: 16:43  Rate: 114  Rhythm:sinus tachycardia  Axis: normal  Intervals:none  ST&T Change: No acute ST segment elevation. Nonspecific T-wave abnormality.  ____________________________________________  RADIOLOGY  CXR IMPRESSION: 1.  Cardiomegaly with mild edema and bilateral effusions compatible with congestive heart failure. 2. Right upper lobe nodule.  ____________________________________________   PROCEDURES  Procedure(s) performed: None  Critical Care performed: Yes, see critical care note(s). Total critical care time spent 35 minutes.  ____________________________________________   INITIAL IMPRESSION / ASSESSMENT AND PLAN / ED COURSE  Pertinent labs & imaging results that were available during my care of the patient were reviewed by me and considered in my medical decision making (see chart for details).  TEXAS OBORN is a 81 y.o. female with hypertension, COPD, breast cancer who presents for evaluation of several days increased shortness of breath. On exam, she is in moderate to severe respiratory distress. Able to speak in short phrases. She has diffuse expiratory wheeze, prolonged expiratory phase consistent with COPD exacerbation. She is mildly tachycardic. Plan for screening labs/cardiac labs, chest x-ray. We'll continue DuoNebs, give Levaquin as well as magnesium. Anticipate admission.  ----------------------------------------- 5:58 PM on 08/31/2014 -----------------------------------------  Patient with improved work of breathing after several DuoNeb treatments, Levaquin, magnesium. BNP is elevated at 733. Chest x-ray consistent with congestive heart failure. Suspect combined COPD and CHF exacerbation. We'll give 20 milligrams of IV Lasix. Additionally, troponin elevated at 0.34 however patient with history of GI bleed in the setting of aspirin use a will not administer at this time. Discussed with Hospitalist for admission. ____________________________________________   FINAL CLINICAL IMPRESSION(S) / ED DIAGNOSES  Final diagnoses:  COPD exacerbation  Acute exacerbation of CHF (congestive heart failure)  NSTEMI (non-ST elevated myocardial infarction)      Joanne Gavel, MD 08/31/14 1801

## 2014-08-31 NOTE — H&P (Signed)
[facility] NAME: Jo Frank    MR#:  [id number]  DATE OF BIRTH:  Sep 02, 1928  DATE OF ADMISSION:  08/31/2014  PRIMARY CARE PHYSICIAN: [name], MD   REQUESTING/REFERRING PHYSICIAN: Darrick Penna  CHIEF COMPLAINT:   Chief Complaint  Patient presents with  . Shortness of Breath    HISTORY OF PRESENT ILLNESS: Jo Frank  is a 81 y.o. female with a known history of hypertension, CVA, COPD, gastroesophageal reflux disease, breast cancer in the past, recently diagnosed with the lung cancer and metastases in the liver for last 2-3 weeks has been been having progressive worsening of shortness of breath , she was also given a dose of tapering steroid and azithromycin by PMD but did not help much and her problem continued to get worse so finally decided to come to emergency room today for this she describes her shortness of breath worse while lying down and feels little better when sitting up, she also has noticed swelling on her feet and her legs. She was seen by Dr. Oliva Bustard and he prescribed Symbicort, nebulizer, oxygen therapy at home. Her husband is on home hospice therapy and so the hospice nurse comes for him , when she saw the patient she suggested to call 911 and go to emergency room.  In ER she is noted to have elevated BNP level, chest x-ray suggestive of pulmonary edema and so she is given for admission.    PAST MEDICAL HISTORY:   Past Medical History  Diagnosis Date  . Hypertension   . CVA (cerebral infarction) 2010    Was on Plavix for 2 years, then switched to clopidogrel but d/c due to side effects ("sick")  . Nephrolithiasis     per patient, passes on her own  . Stroke   . COPD (chronic obstructive pulmonary disease)   . GERD (gastroesophageal reflux disease)   . Breast cancer 2013    breast cancer right-radiation-lumpectomy  . Lung cancer     PAST SURGICAL HISTORY:  Past Surgical History  Procedure  Laterality Date  . Breast lumpectomy      left side  . Abdominal hysterectomy  1960s    "bleeding profusely"  . Cholecystectomy    . Hand surgery      s/p fall    SOCIAL HISTORY:  History  Substance Use Topics  . Smoking status: Former Smoker -- 0.50 packs/day for 30 years    Types: Cigarettes    Quit date: 12/07/1978  . Smokeless tobacco: Never Used  . Alcohol Use: No    FAMILY HISTORY:  Family History  Problem Relation Age of Onset  . Throat cancer Mother     heavy smoker & drinker, died at 79 yo (1978)  . Heart failure Father   . Hypertension Sister   . Stroke Sister     DRUG ALLERGIES:  Allergies  Allergen Reactions  . Aspirin Shortness Of Breath, Swelling and Other (See Comments)    Pt states that she vomits blood.   Marland Kitchen Penicillins Anaphylaxis  . Nitrofuran Derivatives Nausea And Vomiting    REVIEW OF SYSTEMS:   CONSTITUTIONAL: No fever, fatigue or weakness.  EYES: No blurred or double vision.  EARS, NOSE, AND THROAT: No tinnitus or ear pain.  RESPIRATORY: No cough, positive for shortness of breath, No wheezing or hemoptysis.  CARDIOVASCULAR: No chest pain, orthopnea, edema.  GASTROINTESTINAL: No nausea, vomiting, diarrhea or abdominal pain.  GENITOURINARY: No dysuria, hematuria.  ENDOCRINE: No  polyuria, nocturia,  HEMATOLOGY: No anemia, easy bruising or bleeding SKIN: No rash or lesion. MUSCULOSKELETAL: No joint pain or arthritis.  Leg swelling present. NEUROLOGIC: No tingling, numbness, weakness.  PSYCHIATRY: No anxiety or depression.   MEDICATIONS AT HOME:  Prior to Admission medications   Medication Sig Start Date End Date Taking? Authorizing Provider  acetaminophen (TYLENOL) 500 MG tablet Take 1,000 mg by mouth every 6 (six) hours as needed for mild pain or headache.     Historical Provider, MD  albuterol (PROVENTIL HFA;VENTOLIN HFA) 108 (90 BASE) MCG/ACT inhaler Inhale 1-2 puffs into the lungs every 4 (four) hours as needed for wheezing or  shortness of breath. 08/18/14   Forest Gleason, MD  azithromycin (ZITHROMAX) 500 MG tablet Take 1 tablet (500 mg total) by mouth daily. 08/25/14   Forest Gleason, MD  benzonatate (TESSALON) 100 MG capsule Take 1 capsule (100 mg total) by mouth 3 (three) times daily. 08/17/14   Forest Gleason, MD  budesonide-formoterol (SYMBICORT) 160-4.5 MCG/ACT inhaler Inhale 2 puffs into the lungs 2 (two) times daily.    Historical Provider, MD  budesonide-formoterol (SYMBICORT) 160-4.5 MCG/ACT inhaler Inhale into the lungs. 08/19/14 08/19/15  Historical Provider, MD  clopidogrel (PLAVIX) 75 MG tablet Take 1 tablet (75 mg total) by mouth daily with breakfast. 12/07/12   Juluis Mire, MD  Cyanocobalamin (VITAMIN B-12 PO) Take 1 tablet by mouth daily.     Historical Provider, MD  enalapril (VASOTEC) 20 MG tablet Take 20 mg by mouth daily.    Historical Provider, MD  esomeprazole (NEXIUM) 20 MG capsule Take 20-40 mg by mouth daily as needed (for indigestion/heartburn).     Historical Provider, MD  ipratropium-albuterol (DUONEB) 0.5-2.5 (3) MG/3ML SOLN Take 3 mLs by nebulization 3 (three) times daily as needed. Dx: chronic COPD 08/25/14   Forest Gleason, MD  Oxymetazoline HCl ([employer] NASAL SPRAY NA) Place 1 spray into the nose at bedtime as needed (for congestion).     Historical Provider, MD  predniSONE (DELTASONE) 20 MG tablet Take 1 tablet (20 mg total) by mouth daily with breakfast. 08/30/14   Forest Gleason, MD      PHYSICAL EXAMINATION:   VITAL SIGNS: Blood pressure 136/67, pulse 112, temperature 97.8 F (36.6 C), temperature source Oral, resp. rate 23, height '5\' 1"'$  (1.549 m), weight 73.936 kg (163 lb), SpO2 96 %.  GENERAL:  81 y.o.-year-old patient lying in the bed with no acute distress.  EYES: Pupils equal, round, reactive to light and accommodation. No scleral icterus. Extraocular muscles intact.  HEENT: Head atraumatic, normocephalic. Oropharynx and nasopharynx clear.  NECK:  Supple, no jugular venous distention. No  thyroid enlargement, no tenderness.  LUNGS: Normal breath sounds bilaterally, no wheezing, positive for crepitation. No use of accessory muscles of respiration.  CARDIOVASCULAR: S1, S2 normal. No murmurs.  ABDOMEN: Soft, nontender, nondistended. Bowel sounds present. No organomegaly or mass.  EXTREMITIES: positive for pedal edema, No cyanosis, or clubbing.  NEUROLOGIC: Cranial nerves II through XII are intact. Muscle strength 5/5 in all extremities. Sensation intact. Gait not checked.  PSYCHIATRIC: The patient is alert and oriented x 3.  SKIN: No obvious rash, lesion, or ulcer.   LABORATORY PANEL:   CBC  Recent Labs Lab 08/25/14 1110 08/31/14 1651  WBC 12.9* 9.6  HGB 9.9* 9.2*  HCT 31.5* 28.7*  PLT 385 410  MCV 85.6 84.7  MCH 26.9 27.1  MCHC 31.4* 32.0  RDW 16.1* 16.5*  LYMPHSABS 0.8* 0.3*  MONOABS 1.1* 0.1*  EOSABS 0.1 0.0  BASOSABS 0.0 0.0   ------------------------------------------------------------------------------------------------------------------  Chemistries   Recent Labs Lab 08/25/14 1110 08/31/14 1651  NA 134* 129*  K 3.3* 3.4*  CL 99* 94*  CO2 27 23  GLUCOSE 101* 167*  BUN 12 17  CREATININE 1.08* 1.17*  CALCIUM 8.4* 8.7*  AST 18 31  ALT 19 26  ALKPHOS 65 69  BILITOT 0.9 0.4   ------------------------------------------------------------------------------------------------------------------ estimated creatinine clearance is 32.9 mL/min (by C-G formula based on Cr of 1.17). ------------------------------------------------------------------------------------------------------------------ No results for input(s): TSH, T4TOTAL, T3FREE, THYROIDAB in the last 72 hours.  Invalid input(s): FREET3   Coagulation profile No results for input(s): INR, PROTIME in the last 168 hours. ------------------------------------------------------------------------------------------------------------------- No results for input(s): DDIMER in the last 72  hours. -------------------------------------------------------------------------------------------------------------------  Cardiac Enzymes  Recent Labs Lab 08/31/14 1651  TROPONINI 0.34*   ------------------------------------------------------------------------------------------------------------------ Invalid input(s): POCBNP  ---------------------------------------------------------------------------------------------------------------  Urinalysis    Component Value Date/Time   COLORURINE YELLOW 12/06/2012 1853   APPEARANCEUR CLEAR 12/06/2012 1853   LABSPEC 1.013 12/06/2012 1853   PHURINE 5.0 12/06/2012 1853   GLUCOSEU NEGATIVE 12/06/2012 1853   HGBUR SMALL* 12/06/2012 1853   BILIRUBINUR NEGATIVE 12/06/2012 1853   KETONESUR NEGATIVE 12/06/2012 1853   PROTEINUR NEGATIVE 12/06/2012 1853   UROBILINOGEN 0.2 12/06/2012 1853   NITRITE NEGATIVE 12/06/2012 1853   LEUKOCYTESUR NEGATIVE 12/06/2012 1853     RADIOLOGY: Dg Chest Portable 1 View  08/31/2014   CLINICAL DATA:  Progressive shortness of breath. History of lung cancer. Liver cancer. Breast cancer. COPD.  EXAM: PORTABLE CHEST - 1 VIEW  COMPARISON:  CT of the chest 08/18/2014.  FINDINGS: The heart is enlarged. Mild edema is present. Bilateral effusions are noted. Bibasilar airspace disease is evident. Nodular disease in the right upper lobe is partially obscured by edema.  IMPRESSION: 1. Cardiomegaly with mild edema and bilateral effusions compatible with congestive heart failure. 2. Right upper lobe nodule.   Electronically Signed   By: San Morelle M.D.   On: 08/31/2014 17:14    IMPRESSION AND PLAN:  * Acute CHF exacerbation   do not have a recent echocardiogram, to determine systolic versus diastolic.  We'll follow serial troponin, echocardiogram, IV Lasix.  Daily weight and intake and output measurements.  Counseled patient about fluid restriction and salt intake.  * Elevated troponin  This may be secondary to  CHF we will follow serial troponin to check the trend.  * History of COPD  No active wheezing, continue nebulizer, and oxygen supplementation.  Recently treated with antibiotic and oral steroids by PMD.  * Lung cancer metastasis to liver  Recently diagnosed and she is scheduled to have her first chemotherapy next Monday by Dr. Oliva Bustard.   All the records are reviewed and case discussed with ED provider. Management plans discussed with the patient, family and they are in agreement.  CODE STATUS: full    TOTAL TIME TAKING CARE OF THIS PATIENT: 50 minutes.    Vaughan Basta M.D on 08/31/2014   Between 7am to 6pm - Pager - [phone]  After 6pm go to [email or web address] - password [email or web address] [facility]  Office  [phone]  CC: Primary care physician; [name], MD

## 2014-08-31 NOTE — ED Notes (Signed)
Patient to ED from home with c/o worsening shortness of breath. Patient has history of Lung and Liver CA and was started on oxygen therapy today. Patient with initial oxygen of 94% at home however having significant work of breathing. IV inserted per EMS and Solu Medrol as well as 1 Duoneb given. Patient reports feeling some better on arrival, however with act of removing oxygen to take shirt off became very panicked and needed great assistance in calming down. Dr. Edd Fabian present for this.

## 2014-08-31 NOTE — Progress Notes (Signed)
[place] @ [facility] [phone]  Fax:([phone] [place] OB: 17-Apr-1928  MR#: [id number]  NAT#:[id number]  Patient Care Team: [name], MD as PCP - General (Internal Medicine)  CHIEF COMPLAINT:  Chief Complaint  Patient presents with  . Follow-up    VISIT DIAGNOSIS:     ICD-9-CM ICD-10-CM   1. Chronic obstructive pulmonary disease, unspecified COPD, unspecified chronic bronchitis type 496 J44.9 For home use only DME oxygen  2. Shortness of breath 786.05 R06.02 For home use only DME oxygen     Oncology History   1.  Diagnosis of right breast cancer T1 cN0 M0 tumor triple negative disease status post partial mastectomy in September 2013.  Patient underwent lumpectomy radiation therapy there is no record of evaluation by oncologist.  2pT1c pN0 (sn) cM0 triple negative grade 2 invasive carcinoma of the right breast lumpectomy and sentinel node study on 10/11/2011. Tumor size 1.8 cm, grade 2, margins uninvolved by invasive carcinoma.  DCIS present, closest margin is 5 mm. 2 sentinel lymph nodes negative. ER negative (0%), PR negative (0%), HER-2/neu FISH negative (HER2/CEP17 ratio 1.16). 3.  CT scan of the chest revealed multiple lung nodules and the one nodule in July of . 4.  Liver biopsy is consistent with adenocarcinoma most likely lung cancer versus cholangiocarcinoma breast cancer has been ruled out (July, 2016)     Breast CA   10/09/2011 Initial Diagnosis Breast CA stage IC triple negative disease status post lumpectomy and radiation therapy    Oncology Flowsheet 12/06/2012 08/12/2014 08/12/2014 08/25/2014  methylPREDNISolone sodium succinate 125 mg/2 mL (SOLU-MEDROL) IV - - - 125 mg  ondansetron (ZOFRAN) IV 4 mg 4 mg 4 mg -    INTERVAL HISTORY:  81 year old lady started feeling weak and tired.  Was evaluated by primary care physician initially she received a course of prednisone and antibiotic without much relief than patient  was evaluated had a chest x-ray followed by CT scan.  CT scan revealed right upper lobe lung mass multiple lung nodules and a liver nodule.  Patient had a previous history of node-negative carcinoma of breast. Feeling weak and tired.  No hemoptysis no chest pain no bony pains. August 18, 2014 Patient presented with acute onset of shortness of breath.  According to patient is started early this morning with increasing cough and shortness of breath patient ran out of inhaler.  She called her primary doctor and she was advised to go to the emergency room she did not want to wait so presented to the cancer center.  No chills.  No fever.  Oxygen saturation documented to be 87%  July  28 th ,2016  Patient came today for further follow-up and treatment consideration.  Having increasing shortness of breath. Cough yellowish expectoration No chills.  No fever. Appetite stable,  August 30, 2014 Patient continues to feel bad.  Increasing shortness of breath.  Nebulizer is not helping. Shortness of breath on exertion as well as at rest. Tissue for region shows lung cancer most likely Patient is here for further follow-up. Oxygen saturation dropped to 87% on exertion REVIEW OF STEMS:    general status: Patient is feeling weak and tired.  No change in a performance status.  No chills.  No fever.  HEENT  No evidence of stomatitis Lungs: Dry hacking cough.  Increasing shortness of breath Patient is acutely short of breath started early this morning.  No chills.  No fever. Cardiac: No chest pain or  paroxysmal nocturnal dyspnea GI: No nausea no vomiting no diarrhea no abdominal pain Poor appetite Skin: No rash Lower extremity no swelling Neurological system: No tingling.  No numbness.  No other focal signs Musculoskeletal system no bony pains  GU: No hematuria.  No dysuria. Extremely depressed.  As per HPI. Otherwise, a complete review of systems is negatve.  PAST MEDICAL HISTORY: Past Medical History    Diagnosis Date  . Hypertension   . CVA (cerebral infarction) 2010    Was on Plavix for 2 years, then switched to clopidogrel but d/c due to side effects ("sick")  . Nephrolithiasis     per patient, passes on her own  . Stroke   . COPD (chronic obstructive pulmonary disease)   . GERD (gastroesophageal reflux disease)   . Breast cancer 2013    breast cancer right-radiation-lumpectomy    PAST SURGICAL HISTORY: Past Surgical History  Procedure Laterality Date  . Breast lumpectomy      left side  . Abdominal hysterectomy  1960s    "bleeding profusely"  . Cholecystectomy    . Hand surgery      s/p fall    FAMILY HISTORY Family History  Problem Relation Age of Onset  . Throat cancer Mother     heavy smoker & drinker, died at 79 yo (1978)  . Heart failure Father   . Hypertension Sister   . Stroke Sister          ADVANCED DIRECTIVES:  Patient does have advance healthcare directive, Patient   does not desire to make any changes  HEALTH MAINTENANCE: History  Substance Use Topics  . Smoking status: Former Smoker -- 0.50 packs/day for 30 years    Types: Cigarettes    Quit date: 12/07/1978  . Smokeless tobacco: Never Used  . Alcohol Use: No     Allergies  Allergen Reactions  . Penicillins Anaphylaxis  . Aspirin Other (See Comments)    Pt states that she vomits blood.   [place] Spikes Derivatives Nausea And Vomiting    Current Outpatient Prescriptions  Medication Sig Dispense Refill  . acetaminophen (TYLENOL) 500 MG tablet Take 1,000 mg by mouth every 6 (six) hours as needed for mild pain or headache.     . albuterol (PROVENTIL HFA;VENTOLIN HFA) 108 (90 BASE) MCG/ACT inhaler Inhale 1-2 puffs into the lungs every 4 (four) hours as needed for wheezing or shortness of breath. 1 Inhaler 3  . azithromycin (ZITHROMAX) 500 MG tablet Take 1 tablet (500 mg total) by mouth daily. 5 tablet 0  . benzonatate (TESSALON) 100 MG capsule Take 1 capsule (100 mg total) by mouth 3  (three) times daily. 30 capsule 0  . budesonide-formoterol (SYMBICORT) 160-4.5 MCG/ACT inhaler Inhale 2 puffs into the lungs 2 (two) times daily.    . budesonide-formoterol (SYMBICORT) 160-4.5 MCG/ACT inhaler Inhale into the lungs.    . clopidogrel (PLAVIX) 75 MG tablet Take 1 tablet (75 mg total) by mouth daily with breakfast. 90 tablet 0  . Cyanocobalamin (VITAMIN B-12 PO) Take 1 tablet by mouth daily.     . enalapril (VASOTEC) 20 MG tablet Take 20 mg by mouth daily.    Marland Kitchen esomeprazole (NEXIUM) 20 MG capsule Take 20-40 mg by mouth daily as needed (for indigestion/heartburn).     Marland Kitchen ipratropium-albuterol (DUONEB) 0.5-2.5 (3) MG/3ML SOLN Take 3 mLs by nebulization 3 (three) times daily as needed. Dx: chronic COPD 360 mL 3  . Oxymetazoline HCl ([employer] NASAL SPRAY NA) Place 1 spray into the nose  at bedtime as needed (for congestion).     . predniSONE (DELTASONE) 20 MG tablet Take 1 tablet (20 mg total) by mouth daily with breakfast. 30 tablet 1   No current facility-administered medications for this visit.    OBJECTIVE: PHYSICAL EXAM:has not lost sig General  status: Performance status is good.  Patient is feeling weak and tired.  Poor appetite.  Very depressed. HEENT: No evidence of stomatitis. Sclera and conjunctivae :: No jaundice.   pale looking. Lungs: bilateral  rhonchi.  Lymphatic system: Cervical, axillary, inguinal, lymph nodes not palpable Lateral rhonchi.  Diminished air entry on both sides. GI: Abdomen is soft.  No ascites.  Liver spleen not palpable.  No tenderness.  Bowel sounds are within normal limit Lower extremity: No edema Neurological system: Higher functions, cranial nerves intact no evidence of peripheral neuropathy. Skin: No rash.  No ecchymosis.Danley Danker Vitals:   08/30/14 1427  BP: 121/61  Pulse: 85  Temp: 98.2 F (36.8 C)     Body mass index is 29.34 kg/(m^2).    ECOG FS:1 - Symptomatic but completely ambulatory  LAB RESULTS:  No visits with results within 2  Day(s) from this visit. Latest known visit with results is:  Appointment on 08/25/2014  Component Date Value Ref Range Status  . WBC 08/25/2014 12.9* 3.6 - 11.0 K/uL Final  . RBC 08/25/2014 3.68* 3.80 - 5.20 MIL/uL Final  . Hemoglobin 08/25/2014 9.9* 12.0 - 16.0 g/dL Final  . HCT 08/25/2014 31.5* 35.0 - 47.0 % Final  . MCV 08/25/2014 85.6  80.0 - 100.0 fL Final  . MCH 08/25/2014 26.9  26.0 - 34.0 pg Final  . MCHC 08/25/2014 31.4* 32.0 - 36.0 g/dL Final  . RDW 08/25/2014 16.1* 11.5 - 14.5 % Final  . Platelets 08/25/2014 385  150 - 440 K/uL Final  . Neutrophils Relative % 08/25/2014 84   Final  . Neutro Abs 08/25/2014 10.9* 1.4 - 6.5 K/uL Final  . Lymphocytes Relative 08/25/2014 6   Final  . Lymphs Abs 08/25/2014 0.8* 1.0 - 3.6 K/uL Final  . Monocytes Relative 08/25/2014 9   Final  . Monocytes Absolute 08/25/2014 1.1* 0.2 - 0.9 K/uL Final  . Eosinophils Relative 08/25/2014 1   Final  . Eosinophils Absolute 08/25/2014 0.1  0 - 0.7 K/uL Final  . Basophils Relative 08/25/2014 0   Final  . Basophils Absolute 08/25/2014 0.0  0 - 0.1 K/uL Final  . Sodium 08/25/2014 134* 135 - 145 mmol/L Final  . Potassium 08/25/2014 3.3* 3.5 - 5.1 mmol/L Final  . Chloride 08/25/2014 99* 101 - 111 mmol/L Final  . CO2 08/25/2014 27  22 - 32 mmol/L Final  . Glucose, Bld 08/25/2014 101* 65 - 99 mg/dL Final  . BUN 08/25/2014 12  6 - 20 mg/dL Final  . Creatinine, Ser 08/25/2014 1.08* 0.44 - 1.00 mg/dL Final  . Calcium 08/25/2014 8.4* 8.9 - 10.3 mg/dL Final  . Total Protein 08/25/2014 6.4* 6.5 - 8.1 g/dL Final  . Albumin 08/25/2014 2.9* 3.5 - 5.0 g/dL Final  . AST 08/25/2014 18  15 - 41 U/L Final  . ALT 08/25/2014 19  14 - 54 U/L Final  . Alkaline Phosphatase 08/25/2014 65  38 - 126 U/L Final  . Total Bilirubin 08/25/2014 0.9  0.3 - 1.2 mg/dL Final  . GFR calc non Af Amer 08/25/2014 46* >60 mL/min Final  . GFR calc Af Amer 08/25/2014 53* >60 mL/min Final   Comment: (NOTE) The eGFR has been calculated  using  the CKD EPI equation. This calculation has not been validated in all clinical situations. eGFR's persistently <60 mL/min signify possible Chronic Kidney Disease.   . Anion gap 08/25/2014 8  5 - 15 Final  . CA 19-9 08/25/2014 673* 0 - 35 U/mL Final   Comment: (NOTE) Roche ECLIA methodology Performed At: [facility] [place], [place] [id number] Lindon Romp MD PF:[id number]      STUDIES: Ct Abdomen Wo Contrast  Aug 28, 2014   CLINICAL DATA:  Acute upper abdominal pain after hepatic biopsy.  EXAM: CT ABDOMEN WITHOUT CONTRAST  TECHNIQUE: Multidetector CT imaging of the abdomen was performed following the standard protocol without IV contrast.  COMPARISON:  CT scan of same day.  FINDINGS: Status post cholecystectomy. No pneumothorax is noted in visualized lung bases. Hypodense mass is noted in right hepatic lobe with Gel-Foam surrounding it consistent with recent percutaneous biopsy. There does appear to be a small subcapsular hematoma seen anteriorly over the right hepatic lobe. No other hemorrhage is noted. Atherosclerosis of abdominal aorta is noted without aneurysm formation. Large left renal cyst is again noted. The spleen and pancreas appear normal. Right renal cysts are stable. There is no evidence of bowel obstruction.  IMPRESSION: Right hepatic lesion is again noted which was the object of biopsy today. Gel-Foam is noted in and surrounding the lesion. This is expected finding status post biopsy. Small subcapsular hematoma is seen along the anterior portion of the right hepatic lobe.   Electronically Signed   By: Marijo Conception, M.D.   On: 28-Aug-2014 12:59   Ct Angio Chest Pe W/cm &/or Wo Cm  08/18/2014   CLINICAL DATA:  LEFT chest pain with cough starting this morning, history of lung cancer, RIGHT breast cancer, hypertension, stroke, former smoker  EXAM: CT ANGIOGRAPHY CHEST WITH CONTRAST  TECHNIQUE: Multidetector CT imaging of the chest was performed using the  standard protocol during bolus administration of intravenous contrast. Multiplanar CT image reconstructions and MIPs were obtained to evaluate the vascular anatomy.  CONTRAST:  43m OMNIPAQUE IOHEXOL 350 MG/ML SOLN IV  COMPARISON:  07/29/2014  FINDINGS: Scattered atherosclerotic calcifications aorta, proximal great vessels, and coronary arteries.  No aortic aneurysm or dissection.  Pulmonary arteries well opacified with minimal dilatation of central pulmonary arteries.  Pulmonary arteries patent without evidence of pulmonary embolism.  Large cyst upper pole LEFT kidney 11.3 x 9.8 cm.  Multiple small RIGHT renal cyst.  Moderate-sized hiatal hernia.  Vague area of low attenuation lateral RIGHT lobe liver 3.3 cm greatest size corresponding to liver lesion which was previously biopsied.  Remaining visualized portions of upper abdomen unremarkable.  Enlarged RIGHT paratracheal lymph node 14 mm short axis image 35 previously 12 mm.  Enlarged precarinal lymph node 13 mm short axis image 47 unchanged.  Adenopathy at superior aspect of RIGHT pulmonary hilum unchanged, 14 mm short axis image 47.  RIGHT upper lobe mass anteriorly compatible with neoplasm, appears larger than on previous exam though this could be overestimated due to adjacent atelectasis, currently measuring 3.6 x 2.9 cm previously 3.0 x 2.9 cm.  Focus of infiltrate versus non solid opacity in RIGHT upper lobe more inferolaterally appears stable.  Additional stellate semi solid opacity in RIGHT lower lobe 2.5 cm diameter image 62 little changed.  Patchy sub solid, solid, and nodular infiltrates in RIGHT lower lobe again identified.  Subtle areas of non solid opacity in the LEFT lung are also again identified, grossly similar to previous exam no additional  discrete mass or nodule identified.  Tiny RIGHT pleural effusion.  No pneumothorax.  Thoracolumbar dextro convex scoliosis with rotatory component.  Osseous demineralization.  Review of the MIP images confirms  the above findings.  IMPRESSION: No evidence of pulmonary embolism.  Mild dilatation of the central pulmonary arteries raising question of pulmonary arterial hypertension.  RIGHT upper lobe mass compatible with neoplasm question minimally larger than on previous exam versus overestimation due to adjacent atelectasis which appears increased.  2.5 cm diameter stellate semi solid opacity RIGHT lower lobe question additional tumor.  Multiple additional solid, semi solid and non solid opacities in both lungs may represent inflammatory process though additional tumor not completely excluded, recommend attention on follow-up exams ; some with the more nodular appearing opacities in the RIGHT lower lobe have decreased in size since the previous exam.  Renal cysts including an 11.3 cm cyst at the upper pole of the LEFT kidney.  Small hiatal hernia.   Electronically Signed   By: Lavonia Dana M.D.   On: 08/18/2014 17:26   Nm Pet Image Initial (pi) Skull Base To Thigh  08/15/2014   CLINICAL DATA:  Subsequent treatment strategy for liver lesion biopsied on 08/12/2014. History of right breast cancer in 2013 with last radiation therapy in 2014.  EXAM: NUCLEAR MEDICINE PET SKULL BASE TO THIGH  TECHNIQUE: 12.42 mCi F-18 FDG was injected intravenously. Full-ring PET imaging was performed from the skull base to thigh after the radiotracer. CT data was obtained and used for attenuation correction and anatomic localization.  FASTING BLOOD GLUCOSE:  Value: 79 mg/dl  COMPARISON:  Abdominal CT 08/12/2014.  Chest CT 07/29/2014.  FINDINGS: NECK  There are 2 adjacent left supraclavicular lymph nodes which are hypermetabolic. The larger of these measures 10 mm short axis on image 66 and has an SUV max of 4.1. No other hypermetabolic cervical lymph nodes demonstrated.There are no lesions of the pharyngeal mucosal space.  CHEST  The enlarged right paratracheal and right hilar lymph nodes on recent CT are hypermetabolic. The largest right  paratracheal node measures 1.7 cm short axis on image 90 and has an SUV max of 10.0. No hypermetabolic subcarinal, left hilar or axillary adenopathy. The dominant spiculated right upper lobe mass is now partly obscured by atelectasis, but measures approximately 3.3 x 3.1 cm on image 84 and is hypermetabolic with an SUV max of 15.9. The other smaller ground-glass opacities in the right upper lobe (image 84) and in the superior segment of the right lower lobe (image 96) also demonstrate low-level hypermetabolic activity (SUV max 2.1). There is a 9 mm focal ground-glass opacity in the left upper lobe on image 84 which also demonstrates low-level activity. There is increased patchy airspace opacity dependently in the right lower lobe with associated low level metabolic activity, likely inflammatory.  ABDOMEN/PELVIS  The recently biopsied dominant lesion in the right hepatic lobe is hypermetabolic. This lesion measures approximately 3.5 cm on image 128 and has an SUV max of 10.6. There are at least 2 smaller adjacent hypermetabolic metastases within the right hepatic lobe. There is no suspicious activity within the spleen, pancreas, adrenal glands or kidneys. There is low-level hypermetabolic activity within a single lymph node in the porta hepatis (SUV max 4.0). There is no other hypermetabolic nodal activity. There are low-density adnexal lesions bilaterally status post hysterectomy without associated abnormal metabolic activity. There are multiple low-density renal lesions, including a dominant left renal cyst without suspicious metabolic activity. Nonobstructing calculus noted in the upper pole  of the right kidney, diffuse atherosclerosis and sigmoid diverticulosis noted.  SKELETON  There is a single hypermetabolic osseous metastasis within the right ischium. This is lytic and has an SUV max of 10.2. Low-level activity anteriorly at C1-2 is likely inflammatory. There is a moderate thoracolumbar scoliosis.   IMPRESSION: 1. The dominant right upper lobe pulmonary mass is hypermetabolic, most consistent with bronchogenic carcinoma. There are associated left supraclavicular, right hilar and mediastinal nodal metastases, at least 3 hepatic metastases, and a right ischial nodal metastasis. 2. There are additional ground-glass opacities in both lungs which may represent synchronous adenocarcinoma. 3. Increased patchy dependent opacity in the right lower lobe, likely inflammatory.   Electronically Signed   By: Richardean Sale M.D.   On: 08/15/2014 12:23   Ct Biopsy  08/12/2014   CLINICAL DATA:  Right hepatic mass.  EXAM: CT GUIDED core BIOPSY OF right hepatic lobe mass.  ANESTHESIA/SEDATION: 1.5  Mg IV Versed; 100 mcg IV Fentanyl  Total Moderate Sedation Time: 40 minutes.  PROCEDURE: The procedure risks, benefits, and alternatives were explained to the patient, including pneumothorax, bleeding and infection. Questions regarding the procedure were encouraged and answered. The patient understands and consents to the procedure.  The right upper quadrant of the abdomen was prepped with chlorhexidinein a sterile fashion, and a sterile drape was applied covering the operative field. Sterile gloves were used for the procedure. Local anesthesia was provided with 1% Lidocaine.  Under CT guidance, 17 gauge guiding needle was directed toward lesion in superior and posterior portion of right hepatic lobe. Four core samples were obtained using 18 gauge biopsy needle. Needle was then removed with the simultaneous injection of Gel-Foam slurry. Appropriate dressing was applied.  Complications: None immediate noted on post biopsy imaging.  FINDINGS: Right hepatic mass as described on prior studies.  IMPRESSION: Under CT guidance, percutaneous core biopsy was performed of right hepatic lobe lesion concerning for metastasis.   Electronically Signed   By: Marijo Conception, M.D.   On: 08/12/2014 11:56    ASSESSMENT: CT scan of the chest and  abdomen revealed multiple lung nodule and liver nodule. In September 2013 patient had a right breast cancer triple negative disease T1 cN0 M0 tumor status post resection and radiation therapy in no further adjuvant chemotherapy PET scan has been reviewed Tissue of origin  study revealed that this is more likely to be lung cancer than any other primary Guident testing is pending     PLAN:   Acute onset of shortness of breath appears to be bronchospasm.  Possibility of pulmonary embolism cannot be ruled out  Patient was given a inhalation treatment.  Patient was reexamined mean bronchospasm is improved.  Patient was advised to go in by her inhaler with Symbicort continue prednisone therapy use albuterol as a rescue inhaler.  CT scan was done to rule out pulmonary embolism and that has been reviewed independently there is no evidence of pulmonary embolism. Patient was advised to go to the emergency room here.  Again gets short of breath patient may need aggressive inhalation therapy Further workup for malignancy with the guidance study in markers have been ordered. I suggested admission to the hospital for aggressive broncho dilator treatment.  Possibility of chemotherapy was again discussed. Patient has a problem taking care of her husband who is under hospice care. As oxygen dropped to 87% 2 L of oxygen by nasal cannula as been prescribed Total duration of visit was 35 minutes.  50% or more time was  spent in counseling patient and family regarding prognosis and options of treatment and available resources  Breast CA   Staging form: Breast, AJCC 7th Edition     Clinical: Stage IA (T1c, N0, M0) - Marni Griffon, MD   08/31/2014 8:00 AM

## 2014-09-01 ENCOUNTER — Ambulatory Visit: Payer: PPO | Admitting: Oncology

## 2014-09-01 ENCOUNTER — Other Ambulatory Visit: Payer: Self-pay | Admitting: Family Medicine

## 2014-09-01 ENCOUNTER — Inpatient Hospital Stay: Payer: PPO

## 2014-09-01 LAB — BASIC METABOLIC PANEL
ANION GAP: 12 (ref 5–15)
BUN: 19 mg/dL (ref 6–20)
CALCIUM: 8.8 mg/dL — AB (ref 8.9–10.3)
CHLORIDE: 95 mmol/L — AB (ref 101–111)
CO2: 26 mmol/L (ref 22–32)
CREATININE: 1.26 mg/dL — AB (ref 0.44–1.00)
GFR calc Af Amer: 44 mL/min — ABNORMAL LOW (ref >60)
GFR calc non Af Amer: 38 mL/min — ABNORMAL LOW (ref >60)
Glucose, Bld: 145 mg/dL — ABNORMAL HIGH (ref 65–99)
Potassium: 3.7 mmol/L (ref 3.5–5.1)
SODIUM: 133 mmol/L — AB (ref 135–145)

## 2014-09-01 LAB — CBC
HCT: 29.9 % — ABNORMAL LOW (ref 35.0–47.0)
Hemoglobin: 9.7 g/dL — ABNORMAL LOW (ref 12.0–16.0)
MCH: 27.4 pg (ref 26.0–34.0)
MCHC: 32.3 g/dL (ref 32.0–36.0)
MCV: 84.6 fL (ref 80.0–100.0)
PLATELETS: 392 10*3/uL (ref 150–440)
RBC: 3.54 MIL/uL — ABNORMAL LOW (ref 3.80–5.20)
RDW: 16.5 % — ABNORMAL HIGH (ref 11.5–14.5)
WBC: 7.3 10*3/uL (ref 3.6–11.0)

## 2014-09-01 LAB — TROPONIN I: Troponin I: 0.34 ng/mL — ABNORMAL HIGH (ref <0.031)

## 2014-09-01 MED ORDER — LORAZEPAM 2 MG/ML IJ SOLN
0.5000 mg | Freq: Once | INTRAMUSCULAR | Status: AC
  Administered 2014-09-01: 0.5 mg via INTRAVENOUS
  Filled 2014-09-01: qty 1

## 2014-09-01 MED ORDER — IPRATROPIUM-ALBUTEROL 0.5-2.5 (3) MG/3ML IN SOLN
3.0000 mL | Freq: Once | RESPIRATORY_TRACT | Status: AC
  Administered 2014-09-01: 3 mL via RESPIRATORY_TRACT
  Filled 2014-09-01: qty 3

## 2014-09-01 MED ORDER — BUDESONIDE-FORMOTEROL FUMARATE 160-4.5 MCG/ACT IN AERO
2.0000 | INHALATION_SPRAY | Freq: Two times a day (BID) | RESPIRATORY_TRACT | Status: DC
  Administered 2014-09-01 – 2014-09-08 (×13): 2 via RESPIRATORY_TRACT
  Filled 2014-09-01 (×3): qty 6

## 2014-09-01 MED ORDER — FUROSEMIDE 10 MG/ML IJ SOLN
40.0000 mg | Freq: Once | INTRAMUSCULAR | Status: AC
  Administered 2014-09-01: 40 mg via INTRAVENOUS
  Filled 2014-09-01: qty 4

## 2014-09-01 NOTE — Progress Notes (Signed)
MD, Dr. Jannifer Franklin made aware of chest x ray results, it appears per MD fluid is the cause of her shortness of breath.  Respiratory in with patient now monitoring her on bipap.  Pt tolerating bipap okay for now.  Will monitor urine output.  Continue to monitor. Jessee Avers

## 2014-09-01 NOTE — Care Management (Signed)
Patient admitted with acute exacerbation of chf.  Patient developed a "cold" July 1 failed outpatient treatment.  Saw her PCP in office and had chest xray.  This showed "lung cancer " and subsequent chest CT show "it has spread to my liver."  Has been seen by oncology twice and says she will pursue chemo therapy but the cancer is inoperable. Was to start next Monday.   Patient had home 02 delivered to home the day before admission by [facility] but agency does not have the qualifying sats.  Will obtain these while patient is here.  Patient is primary caregiver for her husband  Who is total care due to advanced parkinsons.  He is followed by [facility].  While patient is in the hospital patient is at the hospice home under respite.  Patient if at all possible would like to return home.  Discussed home health and [facility] may be a good option.   Patient would hope would be able to return home.  Provided family with list of agencies for continuous in home care.  Discussed oncology consult with attending.  Patient has required bipapp during this stay.

## 2014-09-01 NOTE — Progress Notes (Addendum)
MD, Dr. Jannifer Franklin came to assess pt after lasix given.  MD encouraged patient to try the bipap.  RN given verbal orders for 0.5 ativan IV x 1, one time duoneb to help with breathing and to help calm her.  Stat CXR ordered as well.  MD to be update after CXR results.  Will continue to monitor.

## 2014-09-01 NOTE — Progress Notes (Signed)
Admission weight unable to be obtained.  Pt very dyspneic after taking a few steps from ER stretcher.  Pt still wearing pants from home, pt says she will take her pants off once she voids later in shift.

## 2014-09-01 NOTE — Progress Notes (Addendum)
MD, Dr. Jannifer Franklin notified due to patient short of breath, wheezing, gasping for air. BP elevated 159/102, respirations in 30's, HR 120's, oxygen sats in high 90's. MD ordered 40 mg of lasix x 1, Bipap if patient can tolerate it.  Pt refusing bipap at this time, she cannot tolerate things on her face.  MD coming to see pt.

## 2014-09-01 NOTE — Progress Notes (Signed)
Spoke to MD, Dr. Reece Levy-- MD would still like to get 0500 CXR to see if improvement from early morning scan.  Will continue to monitor. Jo Frank

## 2014-09-01 NOTE — Progress Notes (Signed)
PROGRESS NOTE  Jo Frank JHE:[id number] DOB: December 06, 1928 DOA: 08/31/2014 PCP: [name], MD  Subjective:  81 y/o f  With hx of HTN, CVA, COPD, Recent diagnosis of Ca lung with mets admitted with progressive shortness of breath, and cough. No improvement with out pt treatment of tapering Prednisone and antibiotics. ECHO results pending .Troponin levels elevated This am feels better     Objective: BP 159/73 mmHg  Pulse 117  Temp(Src) 97.7 F (36.5 C) (Oral)  Resp 24  Ht '5\' 1"'$  (1.549 m)  Wt 69.945 kg (154 lb 3.2 oz)  BMI 29.15 kg/m2  SpO2 100%  Intake/Output Summary (Last 24 hours) at 09/01/14 1856 Last data filed at 09/01/14 3149  Gross per 24 hour  Intake      0 ml  Output    500 ml  Net   -500 ml   Filed Weights   08/31/14 1640 09/01/14 0427  Weight: 73.936 kg (163 lb) 69.945 kg (154 lb 3.2 oz)    Exam:   General: Anxious  Cardiovascular: S1 S2  Respiratory: Clear to auscultation  Abdomen: Soft. Non tender  Neuro:Non Focal  Extremities; 1 + edema   Data Reviewed: Basic Metabolic Panel:  Recent Labs Lab 08/25/14 1110 08/31/14 1651 09/01/14 0257  NA 134* 129* 133*  K 3.3* 3.4* 3.7  CL 99* 94* 95*  CO2 '27 23 26  '$ GLUCOSE 101* 167* 145*  BUN '12 17 19  '$ CREATININE 1.08* 1.17* 1.26*  CALCIUM 8.4* 8.7* 8.8*   Liver Function Tests:  Recent Labs Lab 08/25/14 1110 08/31/14 1651  AST 18 31  ALT 19 26  ALKPHOS 65 69  BILITOT 0.9 0.4  PROT 6.4* 6.1*  ALBUMIN 2.9* 2.7*   No results for input(s): LIPASE, AMYLASE in the last 168 hours. No results for input(s): AMMONIA in the last 168 hours. CBC:  Recent Labs Lab 08/25/14 1110 08/31/14 1651 09/01/14 0257  WBC 12.9* 9.6 7.3  NEUTROABS 10.9* 9.2*  --   HGB 9.9* 9.2* 9.7*  HCT 31.5* 28.7* 29.9*  MCV 85.6 84.7 84.6  PLT 385 410 392   Cardiac Enzymes:    Recent Labs Lab 08/31/14 1651 08/31/14 1950 09/01/14 0257  TROPONINI 0.34* 0.34* 0.34*   BNP (last 3 results)  Recent  Labs  08/31/14 1651  BNP 733.0*    ProBNP (last 3 results) No results for input(s): PROBNP in the last 8760 hours.  CBG: No results for input(s): GLUCAP in the last 168 hours.  Recent Results (from the past 240 hour(s))  Blood culture (routine x 2)     Status: None (Preliminary result)   Collection Time: 08/31/14  4:52 PM  Result Value Ref Range Status   Specimen Description BLOOD LEFT ASSIST CONTROL  Final   Special Requests BOTTLES DRAWN AEROBIC AND ANAEROBIC 3ML  Final   Culture NO GROWTH < 12 HOURS  Final   Report Status PENDING  Incomplete  Blood culture (routine x 2)     Status: None (Preliminary result)   Collection Time: 08/31/14  4:52 PM  Result Value Ref Range Status   Specimen Description BLOOD RIGHT ASSIST CONTROL  Final   Special Requests BOTTLES DRAWN AEROBIC AND ANAEROBIC 3ML  Final   Culture NO GROWTH < 12 HOURS  Final   Report Status PENDING  Incomplete     Studies: Dg Chest 1 View  09/01/2014   CLINICAL DATA:  Acute onset of dyspnea.  Initial encounter.  EXAM: CHEST  1 VIEW  COMPARISON:  Chest radiograph performed 08/31/2014  FINDINGS: Bilateral central airspace opacification is perhaps slightly worsened from the prior study and concerning for pulmonary edema. Small bilateral pleural effusions are noted. No pneumothorax is seen.  The cardiomediastinal silhouette is borderline enlarged. No acute osseous abnormalities are identified.  IMPRESSION: Slightly worsening pulmonary edema noted, with small bilateral pleural effusions. Borderline cardiomegaly.   Electronically Signed   By: Garald Balding M.D.   On: 09/01/2014 01:33   Dg Chest Portable 1 View  08/31/2014   CLINICAL DATA:  Progressive shortness of breath. History of lung cancer. Liver cancer. Breast cancer. COPD.  EXAM: PORTABLE CHEST - 1 VIEW  COMPARISON:  CT of the chest 08/18/2014.  FINDINGS: The heart is enlarged. Mild edema is present. Bilateral effusions are noted. Bibasilar airspace disease is evident.  Nodular disease in the right upper lobe is partially obscured by edema.  IMPRESSION: 1. Cardiomegaly with mild edema and bilateral effusions compatible with congestive heart failure. 2. Right upper lobe nodule.   Electronically Signed   By: San Morelle M.D.   On: 08/31/2014 17:14    Scheduled Meds: . budesonide-formoterol  2 puff Inhalation BID  . clopidogrel  75 mg Oral Q breakfast  . enalapril  20 mg Oral Daily  . furosemide  20 mg Intravenous Q12H  . heparin  5,000 Units Subcutaneous 3 times per day  . ipratropium-albuterol  3 mL Nebulization Q4H  . pantoprazole  40 mg Oral Daily  . predniSONE  20 mg Oral Q breakfast  . vitamin B-12  100 mcg Oral Daily    Continuous Infusions:   Assessment/Plan: 1 Acute Shortness of breath secondary to CHF-Continue IV Lasix and O2  ECHO results pending CXR : Pulmonary  Edema and Bilat Pleural effusions 2 COPD: Continue SVN and Prednisone  3 HTN: On Enalapril- Continue to monitor 4Ca Lung with Liver mets 5 Hx of CVA -On Plavix    Code Status: Full  Family Communication: Grand daughter      [name]   09/01/2014, 8:22 AM  LOS: 1 day

## 2014-09-02 ENCOUNTER — Telehealth: Payer: Self-pay | Admitting: *Deleted

## 2014-09-02 DIAGNOSIS — I10 Essential (primary) hypertension: Secondary | ICD-10-CM

## 2014-09-02 DIAGNOSIS — E440 Moderate protein-calorie malnutrition: Secondary | ICD-10-CM | POA: Insufficient documentation

## 2014-09-02 DIAGNOSIS — J90 Pleural effusion, not elsewhere classified: Secondary | ICD-10-CM

## 2014-09-02 DIAGNOSIS — R0601 Orthopnea: Secondary | ICD-10-CM

## 2014-09-02 DIAGNOSIS — J811 Chronic pulmonary edema: Secondary | ICD-10-CM

## 2014-09-02 DIAGNOSIS — I509 Heart failure, unspecified: Secondary | ICD-10-CM

## 2014-09-02 DIAGNOSIS — R000 Tachycardia, unspecified: Secondary | ICD-10-CM | POA: Diagnosis not present

## 2014-09-02 DIAGNOSIS — I517 Cardiomegaly: Secondary | ICD-10-CM

## 2014-09-02 DIAGNOSIS — Z800 Family history of malignant neoplasm of digestive organs: Secondary | ICD-10-CM

## 2014-09-02 DIAGNOSIS — C3490 Malignant neoplasm of unspecified part of unspecified bronchus or lung: Secondary | ICD-10-CM | POA: Insufficient documentation

## 2014-09-02 LAB — CBC WITH DIFFERENTIAL/PLATELET
BASOS PCT: 0 %
Basophils Absolute: 0 10*3/uL (ref 0–0.1)
EOS ABS: 0 10*3/uL (ref 0–0.7)
Eosinophils Relative: 0 %
HCT: 29.4 % — ABNORMAL LOW (ref 35.0–47.0)
Hemoglobin: 9.5 g/dL — ABNORMAL LOW (ref 12.0–16.0)
Lymphocytes Relative: 10 %
Lymphs Abs: 1.1 10*3/uL (ref 1.0–3.6)
MCH: 27.3 pg (ref 26.0–34.0)
MCHC: 32.2 g/dL (ref 32.0–36.0)
MCV: 84.8 fL (ref 80.0–100.0)
MONO ABS: 1 10*3/uL — AB (ref 0.2–0.9)
Monocytes Relative: 9 %
Neutro Abs: 9 10*3/uL — ABNORMAL HIGH (ref 1.4–6.5)
Neutrophils Relative %: 81 %
Platelets: 369 10*3/uL (ref 150–440)
RBC: 3.47 MIL/uL — ABNORMAL LOW (ref 3.80–5.20)
RDW: 16.6 % — ABNORMAL HIGH (ref 11.5–14.5)
WBC: 11.1 10*3/uL — ABNORMAL HIGH (ref 3.6–11.0)

## 2014-09-02 LAB — BASIC METABOLIC PANEL
Anion gap: 9 (ref 5–15)
BUN: 24 mg/dL — AB (ref 6–20)
CHLORIDE: 96 mmol/L — AB (ref 101–111)
CO2: 32 mmol/L (ref 22–32)
Calcium: 8.7 mg/dL — ABNORMAL LOW (ref 8.9–10.3)
Creatinine, Ser: 1.38 mg/dL — ABNORMAL HIGH (ref 0.44–1.00)
GFR calc non Af Amer: 34 mL/min — ABNORMAL LOW (ref >60)
GFR, EST AFRICAN AMERICAN: 39 mL/min — AB (ref >60)
GLUCOSE: 103 mg/dL — AB (ref 65–99)
Potassium: 3.6 mmol/L (ref 3.5–5.1)
SODIUM: 137 mmol/L (ref 135–145)

## 2014-09-02 LAB — EXPECTORATED SPUTUM ASSESSMENT W REFEX TO RESP CULTURE

## 2014-09-02 LAB — EXPECTORATED SPUTUM ASSESSMENT W GRAM STAIN, RFLX TO RESP C

## 2014-09-02 LAB — TSH: TSH: 0.738 u[IU]/mL (ref 0.350–4.500)

## 2014-09-02 MED ORDER — LEVOFLOXACIN IN D5W 250 MG/50ML IV SOLN
250.0000 mg | INTRAVENOUS | Status: DC
  Administered 2014-09-03 – 2014-09-04 (×2): 250 mg via INTRAVENOUS
  Filled 2014-09-02 (×4): qty 50

## 2014-09-02 MED ORDER — FUROSEMIDE 20 MG PO TABS
40.0000 mg | ORAL_TABLET | Freq: Every day | ORAL | Status: DC
  Administered 2014-09-02 – 2014-09-08 (×7): 40 mg via ORAL
  Filled 2014-09-02: qty 2
  Filled 2014-09-02: qty 1
  Filled 2014-09-02: qty 2
  Filled 2014-09-02 (×4): qty 1

## 2014-09-02 MED ORDER — IPRATROPIUM-ALBUTEROL 0.5-2.5 (3) MG/3ML IN SOLN
3.0000 mL | Freq: Four times a day (QID) | RESPIRATORY_TRACT | Status: DC
  Administered 2014-09-02 – 2014-09-06 (×15): 3 mL via RESPIRATORY_TRACT
  Filled 2014-09-02 (×15): qty 3

## 2014-09-02 MED ORDER — GUAIFENESIN-CODEINE 100-10 MG/5ML PO SOLN
5.0000 mL | Freq: Four times a day (QID) | ORAL | Status: DC | PRN
  Administered 2014-09-02 – 2014-09-05 (×6): 5 mL via ORAL
  Filled 2014-09-02 (×6): qty 5

## 2014-09-02 MED ORDER — DILTIAZEM HCL ER COATED BEADS 120 MG PO CP24
120.0000 mg | ORAL_CAPSULE | Freq: Every day | ORAL | Status: DC
  Administered 2014-09-02 – 2014-09-08 (×7): 120 mg via ORAL
  Filled 2014-09-02 (×7): qty 1

## 2014-09-02 MED ORDER — LEVOFLOXACIN IN D5W 500 MG/100ML IV SOLN
500.0000 mg | Freq: Once | INTRAVENOUS | Status: AC
  Administered 2014-09-02: 500 mg via INTRAVENOUS
  Filled 2014-09-02: qty 100

## 2014-09-02 NOTE — Care Management Important Message (Signed)
Important Message  Patient Details  Name: EYONNA SANDSTROM MRN: [id number] Date of Birth: [birth year]   Medicare Important Message Given:  Yes-second notification given    [name] 09/02/2014, 9:50 AM

## 2014-09-02 NOTE — Consult Note (Signed)
[place]  [phone]  [phone]     Jo Frank DOB: 04/09/1928  MR#: [id number]  XYI#:[id number]  Patient Care Team: [name], MD as PCP - General (Internal Medicine)  CHIEF COMPLAINT:  Chief Complaint  Patient presents with  . Shortness of Breath   Patient with recent newly diagnosed adenocarcinoma from a liver mass. Most likely lung cancer versus possibility of cholangiocarcinoma. Patient with significant past medical history of COPD as well as breast cancer, triple negative disease from September 2013.  INTERVAL HISTORY:  Jo Frank was admitted with progressive shortness of breath, cough, and weakness. She was initially evaluated by Dr. Oliva Bustard on 08/31/2014 for metastatic adenocarcinoma of the lung with metastases to the liver. These results have been discussed as an outpatient with patient and family. She reports feeling greatly improved today but remains weak and with shortness of breath on exertion. She does report she is able to move around in the bed without shortness of breath, family also notices she is resting more comfortably.  REVIEW OF SYSTEMS:   Review of Systems  Constitutional: Positive for malaise/fatigue. Negative for fever, chills, weight loss and diaphoresis.  HENT: Negative for congestion, ear discharge, ear pain, hearing loss, nosebleeds, sore throat and tinnitus.   Eyes: Negative for blurred vision, double vision, photophobia, pain, discharge and redness.  Respiratory: Positive for cough, sputum production, shortness of breath and wheezing. Negative for hemoptysis and stridor.   Cardiovascular: Positive for orthopnea. Negative for chest pain, palpitations, claudication, leg swelling and PND.  Gastrointestinal: Negative for heartburn, nausea, vomiting, abdominal pain, diarrhea, constipation, blood in stool and melena.  Genitourinary: Negative.   Musculoskeletal: Negative.   Skin: Negative.   Neurological:  Positive for weakness. Negative for dizziness, tingling, focal weakness, seizures and headaches.  Endo/Heme/Allergies: Does not bruise/bleed easily.  Psychiatric/Behavioral: Negative for depression. The patient is not nervous/anxious and does not have insomnia.     As per HPI. Otherwise, a complete review of systems is negatve.  ONCOLOGY HISTORY: Oncology History   1.  Diagnosis of right breast cancer T1 cN0 M0 tumor triple negative disease status post partial mastectomy in September 2013.  Patient underwent lumpectomy radiation therapy there is no record of evaluation by oncologist.  2pT1c pN0 (sn) cM0 triple negative grade 2 invasive carcinoma of the right breast lumpectomy and sentinel node study on 10/11/2011. Tumor size 1.8 cm, grade 2, margins uninvolved by invasive carcinoma.  DCIS present, closest margin is 5 mm. 2 sentinel lymph nodes negative. ER negative (0%), PR negative (0%), HER-2/neu FISH negative (HER2/CEP17 ratio 1.16). 3.  CT scan of the chest revealed multiple lung nodules and the one nodule in July of . 4.  Liver biopsy is consistent with adenocarcinoma most likely lung cancer versus cholangiocarcinoma breast cancer has been ruled out (July, 2016)     Breast CA   10/09/2011 Initial Diagnosis Breast CA stage IC triple negative disease status post lumpectomy and radiation therapy    PAST MEDICAL HISTORY: Past Medical History  Diagnosis Date  . Hypertension   . CVA (cerebral infarction) 2010    Was on Plavix for 2 years, then switched to clopidogrel but d/c due to side effects ("sick")  . Nephrolithiasis     per patient, passes on her own  . Stroke   . COPD (chronic obstructive pulmonary disease)   . GERD (gastroesophageal reflux disease)   . Breast cancer 2013    breast cancer right-radiation-lumpectomy  . Lung cancer  PAST SURGICAL HISTORY: Past Surgical History  Procedure Laterality Date  . Breast lumpectomy      left side  . Abdominal hysterectomy   1960s    "bleeding profusely"  . Cholecystectomy    . Hand surgery      s/p fall    FAMILY HISTORY Family History  Problem Relation Age of Onset  . Throat cancer Mother     heavy smoker & drinker, died at 79 yo (1978)  . Heart failure Father   . Hypertension Sister   . Stroke Sister     GYNECOLOGIC HISTORY:  No LMP recorded. Patient has had a hysterectomy.     ADVANCED DIRECTIVES:    HEALTH MAINTENANCE: History  Substance Use Topics  . Smoking status: Former Smoker -- 0.50 packs/day for 30 years    Types: Cigarettes    Quit date: 12/07/1978  . Smokeless tobacco: Never Used  . Alcohol Use: No     Colonoscopy:  PAP:  Bone density:  Lipid panel:  Allergies  Allergen Reactions  . Aspirin Shortness Of Breath, Swelling and Other (See Comments)    Pt states that she vomits blood.   Marland Kitchen Penicillins Anaphylaxis  . Nitrofuran Derivatives Nausea And Vomiting    Current Facility-Administered Medications  Medication Dose Route Frequency Provider Last Rate Last Dose  . budesonide-formoterol (SYMBICORT) 160-4.5 MCG/ACT inhaler 2 puff  2 puff Inhalation BID Lance Coon, MD   2 puff at 09/02/14 0820  . clopidogrel (PLAVIX) tablet 75 mg  75 mg Oral Q breakfast Vaughan Basta, MD   75 mg at 09/02/14 0820  . diltiazem (CARDIZEM CD) 24 hr capsule 120 mg  120 mg Oral Daily Vishwanath Hande, MD   120 mg at 09/02/14 1410  . enalapril (VASOTEC) tablet 20 mg  20 mg Oral Daily Vaughan Basta, MD   20 mg at 09/02/14 1006  . furosemide (LASIX) tablet 40 mg  40 mg Oral Daily Vishwanath Hande, MD   40 mg at 09/02/14 1006  . guaiFENesin-codeine 100-10 MG/5ML solution 5 mL  5 mL Oral Q6H PRN Vishwanath Hande, MD   5 mL at 09/02/14 1230  . heparin injection 5,000 Units  5,000 Units Subcutaneous 3 times per day Vaughan Basta, MD   5,000 Units at 09/02/14 1410  . ipratropium-albuterol (DUONEB) 0.5-2.5 (3) MG/3ML nebulizer solution 3 mL  3 mL Nebulization Q6H Vishwanath Hande,  MD      . Derrill Memo ON 09/03/2014] Levofloxacin (LEVAQUIN) IVPB 250 mg  250 mg Intravenous Q24H Vishwanath Hande, MD      . pantoprazole (PROTONIX) EC tablet 40 mg  40 mg Oral Daily Vaughan Basta, MD   40 mg at 09/02/14 1006  . predniSONE (DELTASONE) tablet 20 mg  20 mg Oral Q breakfast Vaughan Basta, MD   20 mg at 09/02/14 0820  . vitamin B-12 (CYANOCOBALAMIN) tablet 100 mcg  100 mcg Oral Daily Vaughan Basta, MD   100 mcg at 09/02/14 1006    OBJECTIVE: BP 129/58 mmHg  Pulse 149  Temp(Src) 97.5 F (36.4 C) (Oral)  Resp 17  Ht 5' 1"  (1.549 m)  Wt 149 lb 11.2 oz (67.903 kg)  BMI 28.30 kg/m2  SpO2 95%   Body mass index is 28.3 kg/(m^2).    ECOG FS:3 - Symptomatic, >50% confined to bed  General: Well-developed, well-nourished, no acute distress. Eyes: Pink conjunctiva, anicteric sclera. HEENT: Normocephalic, moist mucous membranes, clear oropharnyx. Lungs: Inspiratory wheezing, rhonchi. Heart: Irregular rate and rhythm, tachycardia Abdomen: Soft, nontender, nondistended. No organomegaly  noted, normoactive bowel sounds. Musculoskeletal: No edema, cyanosis, or clubbing. Neuro: Alert, answering all questions appropriately. Cranial nerves grossly intact. Skin: No rashes or petechiae noted. Psych: Normal affect. Lymphatics: No cervical, calvicular, axillary or inguinal LAD.   LAB RESULTS:  Admission on 08/31/2014  Component Date Value Ref Range Status  . WBC 08/31/2014 9.6  3.6 - 11.0 K/uL Final  . RBC 08/31/2014 3.39* 3.80 - 5.20 MIL/uL Final  . Hemoglobin 08/31/2014 9.2* 12.0 - 16.0 g/dL Final  . HCT 08/31/2014 28.7* 35.0 - 47.0 % Final  . MCV 08/31/2014 84.7  80.0 - 100.0 fL Final  . MCH 08/31/2014 27.1  26.0 - 34.0 pg Final  . MCHC 08/31/2014 32.0  32.0 - 36.0 g/dL Final  . RDW 08/31/2014 16.5* 11.5 - 14.5 % Final  . Platelets 08/31/2014 410  150 - 440 K/uL Final  . Neutrophils Relative % 08/31/2014 96   Final  . Neutro Abs 08/31/2014 9.2* 1.4 - 6.5 K/uL  Final  . Lymphocytes Relative 08/31/2014 3   Final  . Lymphs Abs 08/31/2014 0.3* 1.0 - 3.6 K/uL Final  . Monocytes Relative 08/31/2014 1   Final  . Monocytes Absolute 08/31/2014 0.1* 0.2 - 0.9 K/uL Final  . Eosinophils Relative 08/31/2014 0   Final  . Eosinophils Absolute 08/31/2014 0.0  0 - 0.7 K/uL Final  . Basophils Relative 08/31/2014 0   Final  . Basophils Absolute 08/31/2014 0.0  0 - 0.1 K/uL Final  . Sodium 08/31/2014 129* 135 - 145 mmol/L Final  . Potassium 08/31/2014 3.4* 3.5 - 5.1 mmol/L Final  . Chloride 08/31/2014 94* 101 - 111 mmol/L Final  . CO2 08/31/2014 23  22 - 32 mmol/L Final  . Glucose, Bld 08/31/2014 167* 65 - 99 mg/dL Final  . BUN 08/31/2014 17  6 - 20 mg/dL Final  . Creatinine, Ser 08/31/2014 1.17* 0.44 - 1.00 mg/dL Final  . Calcium 08/31/2014 8.7* 8.9 - 10.3 mg/dL Final  . Total Protein 08/31/2014 6.1* 6.5 - 8.1 g/dL Final  . Albumin 08/31/2014 2.7* 3.5 - 5.0 g/dL Final  . AST 08/31/2014 31  15 - 41 U/L Final  . ALT 08/31/2014 26  14 - 54 U/L Final  . Alkaline Phosphatase 08/31/2014 69  38 - 126 U/L Final  . Total Bilirubin 08/31/2014 0.4  0.3 - 1.2 mg/dL Final  . GFR calc non Af Amer 08/31/2014 42* >60 mL/min Final  . GFR calc Af Amer 08/31/2014 48* >60 mL/min Final   Comment: (NOTE) The eGFR has been calculated using the CKD EPI equation. This calculation has not been validated in all clinical situations. eGFR's persistently <60 mL/min signify possible Chronic Kidney Disease.   . Anion gap 08/31/2014 12  5 - 15 Final  . Troponin I 08/31/2014 0.34* <0.031 ng/mL Final   Comment: READ BACK AND VERIFIED WITH KENDALL MOFFIT AT 3570 08/31/14.Marland KitchenMarland KitchenSMG        PERSISTENTLY INCREASED TROPONIN VALUES IN THE RANGE OF 0.04-0.49 ng/mL CAN BE SEEN IN:       -UNSTABLE ANGINA       -CONGESTIVE HEART FAILURE       -MYOCARDITIS       -CHEST TRAUMA       -ARRYHTHMIAS       -LATE PRESENTING MYOCARDIAL INFARCTION       -COPD   CLINICAL FOLLOW-UP RECOMMENDED.   . B  Natriuretic Peptide 08/31/2014 733.0* 0.0 - 100.0 pg/mL Final  . Specimen Description 08/31/2014 BLOOD LEFT ASSIST CONTROL   Final  .  Special Requests 08/31/2014 BOTTLES DRAWN AEROBIC AND ANAEROBIC 3ML   Final  . Culture 08/31/2014 NO GROWTH 2 DAYS   Final  . Report Status 08/31/2014 PENDING   Incomplete  . Specimen Description 08/31/2014 BLOOD RIGHT ASSIST CONTROL   Final  . Special Requests 08/31/2014 BOTTLES DRAWN AEROBIC AND ANAEROBIC 3ML   Final  . Culture 08/31/2014 NO GROWTH 2 DAYS   Final  . Report Status 08/31/2014 PENDING   Incomplete  . Troponin I 08/31/2014 0.34* <0.031 ng/mL Final   Comment: RESULTS PREVIOUSLY CALLED BY [facility] AT 1725 08/31/14.Marland KitchenMarland KitchenSMG        PERSISTENTLY INCREASED TROPONIN VALUES IN THE RANGE OF 0.04-0.49 ng/mL CAN BE SEEN IN:       -UNSTABLE ANGINA       -CONGESTIVE HEART FAILURE       -MYOCARDITIS       -CHEST TRAUMA       -ARRYHTHMIAS       -LATE PRESENTING MYOCARDIAL INFARCTION       -COPD   CLINICAL FOLLOW-UP RECOMMENDED.   Marland Kitchen Troponin I 09/01/2014 0.34* <0.031 ng/mL Final   Comment: RESULTS PREVIOUSLY CALLED AT 1725 8.3.16 [facility]        PERSISTENTLY INCREASED TROPONIN VALUES IN THE RANGE OF 0.04-0.49 ng/mL CAN BE SEEN IN:       -UNSTABLE ANGINA       -CONGESTIVE HEART FAILURE       -MYOCARDITIS       -CHEST TRAUMA       -ARRYHTHMIAS       -LATE PRESENTING MYOCARDIAL INFARCTION       -COPD   CLINICAL FOLLOW-UP RECOMMENDED.   Marland Kitchen Sodium 09/01/2014 133* 135 - 145 mmol/L Final  . Potassium 09/01/2014 3.7  3.5 - 5.1 mmol/L Final  . Chloride 09/01/2014 95* 101 - 111 mmol/L Final  . CO2 09/01/2014 26  22 - 32 mmol/L Final  . Glucose, Bld 09/01/2014 145* 65 - 99 mg/dL Final  . BUN 09/01/2014 19  6 - 20 mg/dL Final  . Creatinine, Ser 09/01/2014 1.26* 0.44 - 1.00 mg/dL Final  . Calcium 09/01/2014 8.8* 8.9 - 10.3 mg/dL Final  . GFR calc non Af Amer 09/01/2014 38* >60 mL/min Final  . GFR calc Af Amer 09/01/2014 44* >60 mL/min Final   Comment: (NOTE) The  eGFR has been calculated using the CKD EPI equation. This calculation has not been validated in all clinical situations. eGFR's persistently <60 mL/min signify possible Chronic Kidney Disease.   . Anion gap 09/01/2014 12  5 - 15 Final  . WBC 09/01/2014 7.3  3.6 - 11.0 K/uL Final  . RBC 09/01/2014 3.54* 3.80 - 5.20 MIL/uL Final  . Hemoglobin 09/01/2014 9.7* 12.0 - 16.0 g/dL Final  . HCT 09/01/2014 29.9* 35.0 - 47.0 % Final  . MCV 09/01/2014 84.6  80.0 - 100.0 fL Final  . MCH 09/01/2014 27.4  26.0 - 34.0 pg Final  . MCHC 09/01/2014 32.3  32.0 - 36.0 g/dL Final  . RDW 09/01/2014 16.5* 11.5 - 14.5 % Final  . Platelets 09/01/2014 392  150 - 440 K/uL Final  . WBC 09/02/2014 11.1* 3.6 - 11.0 K/uL Final  . RBC 09/02/2014 3.47* 3.80 - 5.20 MIL/uL Final  . Hemoglobin 09/02/2014 9.5* 12.0 - 16.0 g/dL Final  . HCT 09/02/2014 29.4* 35.0 - 47.0 % Final  . MCV 09/02/2014 84.8  80.0 - 100.0 fL Final  . MCH 09/02/2014 27.3  26.0 - 34.0 pg Final  . MCHC 09/02/2014 32.2  32.0 - 36.0 g/dL Final  . RDW 09/02/2014 16.6* 11.5 - 14.5 % Final  . Platelets 09/02/2014 369  150 - 440 K/uL Final  . Neutrophils Relative % 09/02/2014 81   Final  . Neutro Abs 09/02/2014 9.0* 1.4 - 6.5 K/uL Final  . Lymphocytes Relative 09/02/2014 10   Final  . Lymphs Abs 09/02/2014 1.1  1.0 - 3.6 K/uL Final  . Monocytes Relative 09/02/2014 9   Final  . Monocytes Absolute 09/02/2014 1.0* 0.2 - 0.9 K/uL Final  . Eosinophils Relative 09/02/2014 0   Final  . Eosinophils Absolute 09/02/2014 0.0  0 - 0.7 K/uL Final  . Basophils Relative 09/02/2014 0   Final  . Basophils Absolute 09/02/2014 0.0  0 - 0.1 K/uL Final  . Sodium 09/02/2014 137  135 - 145 mmol/L Final  . Potassium 09/02/2014 3.6  3.5 - 5.1 mmol/L Final  . Chloride 09/02/2014 96* 101 - 111 mmol/L Final  . CO2 09/02/2014 32  22 - 32 mmol/L Final  . Glucose, Bld 09/02/2014 103* 65 - 99 mg/dL Final  . BUN 09/02/2014 24* 6 - 20 mg/dL Final  . Creatinine, Ser 09/02/2014 1.38*  0.44 - 1.00 mg/dL Final  . Calcium 09/02/2014 8.7* 8.9 - 10.3 mg/dL Final  . GFR calc non Af Amer 09/02/2014 34* >60 mL/min Final  . GFR calc Af Amer 09/02/2014 39* >60 mL/min Final   Comment: (NOTE) The eGFR has been calculated using the CKD EPI equation. This calculation has not been validated in all clinical situations. eGFR's persistently <60 mL/min signify possible Chronic Kidney Disease.   . Anion gap 09/02/2014 9  5 - 15 Final  . TSH 09/02/2014 0.738  0.350 - 4.500 uIU/mL Final  . Specimen Description 09/02/2014 EXPECTORATED SPUTUM   Final  . Special Requests 09/02/2014 NONE   Final  . Sputum evaluation 09/02/2014 THIS SPECIMEN IS ACCEPTABLE FOR SPUTUM CULTURE   Final  . Report Status 09/02/2014 09/02/2014 FINAL   Final    STUDIES: Dg Chest 1 View  09/01/2014   CLINICAL DATA:  Acute onset of dyspnea.  Initial encounter.  EXAM: CHEST  1 VIEW  COMPARISON:  Chest radiograph performed 08/31/2014  FINDINGS: Bilateral central airspace opacification is perhaps slightly worsened from the prior study and concerning for pulmonary edema. Small bilateral pleural effusions are noted. No pneumothorax is seen.  The cardiomediastinal silhouette is borderline enlarged. No acute osseous abnormalities are identified.  IMPRESSION: Slightly worsening pulmonary edema noted, with small bilateral pleural effusions. Borderline cardiomegaly.   Electronically Signed   By: Garald Balding M.D.   On: 09/01/2014 01:33   Dg Chest Port 1 View  09/01/2014   CLINICAL DATA:  History of hypertension stroke COPD gastroesophageal reflux and breast cancer, recently diagnosed with metastatic lung cancer involving the liver for the past 2-3 weeks with progressive worsening shortness of breath over that time  EXAM: PORTABLE CHEST - 1 VIEW  COMPARISON:  09/01/2014 at 1:18  FINDINGS: Stable mild cardiac enlargement with central vascular congestion. Extensive bilateral perihilar hazy opacities. Small pleural effusions. Interstitial  prominence.  IMPRESSION: Congestive heart failure with moderately severe pulmonary edema stable from prior study   Electronically Signed   By: Skipper Cliche M.D.   On: 09/01/2014 08:27   Dg Chest Portable 1 View  08/31/2014   CLINICAL DATA:  Progressive shortness of breath. History of lung cancer. Liver cancer. Breast cancer. COPD.  EXAM: PORTABLE CHEST - 1 VIEW  COMPARISON:  CT of the chest 08/18/2014.  FINDINGS: The heart is enlarged. Mild edema is present. Bilateral effusions are noted. Bibasilar airspace disease is evident. Nodular disease in the right upper lobe is partially obscured by edema.  IMPRESSION: 1. Cardiomegaly with mild edema and bilateral effusions compatible with congestive heart failure. 2. Right upper lobe nodule.   Electronically Signed   By: San Morelle M.D.   On: 08/31/2014 17:14    ASSESSMENT:  History of stage I breast cancer, September 2013. Now with multiple lung and liver nodules, biopsy of liver lesions most likely consistent with a lung cancer as primary. Stage IV lung cancer, with metastases to liver  PLAN:   1. Lung CA. Patient has pending tissue specific testing. Patient appears to be improving overall as shortness of breath is improving. She was recently seen in the office on August 3 by Dr. Oliva Bustard who discussed diagnosis, prognosis, and plans for treatment with chemotherapy. 2. Atrial fibrillation. Patient's heart rate approximately 120. She has been started on oral Cardizem for control.  Patient's acute shortness of breath is slightly improved today. Pulmonary edema and bilateral pleural effusions are improved. Patient is currently on steroids, oxygen, and duonebs.  Patient's social situation is strenuous as her husband is currently under hospice care due to end-stage Parkinson's. Her granddaughter is at the bedside now and states she feels her grandmother will need some assistance in the home for herself. Discharge would advise [facility] with [facility] if possible for physical therapy as well as nursing care.  We will continue to follow throughout hospital stay.  Patient expressed understanding and was in agreement with this plan. She also understands that She can call clinic at any time with any questions, concerns, or complaints.   Dr. Oliva Bustard was available for consultation and review of plan of care for this patient.  Breast CA   Staging form: Breast, AJCC 7th Edition     Clinical: Stage IA (T1c, N0, M0) - Unsigned   Evlyn Kanner, NP   09/02/2014 4:27 PM

## 2014-09-02 NOTE — Progress Notes (Signed)
PROGRESS NOTE  Jo Frank VHQ:[id number] DOB: 1928-10-18 DOA: 08/31/2014 PCP: [name], MD  Subjective:  81 y/o f  With hx of HTN, CVA, COPD, Recent diagnosis of Ca lung with mets admitted with progressive shortness of breath, and cough. No improvement with out pt treatment of tapering Prednisone and antibiotics. ECHO results pending .Troponin levels elevated This am c/o persistent cough, but less short of breath    Objective: BP 110/60 mmHg  Pulse 77  Temp(Src) 98.1 F (36.7 C) (Oral)  Resp 22  Ht '5\' 1"'$  (1.549 m)  Wt 67.903 kg (149 lb 11.2 oz)  BMI 28.30 kg/m2  SpO2 98%  Intake/Output Summary (Last 24 hours) at 09/02/14 0830 Last data filed at 09/02/14 0345  Gross per 24 hour  Intake    240 ml  Output   1800 ml  Net  -1560 ml   Filed Weights   08/31/14 1640 09/01/14 0427 09/02/14 0344  Weight: 73.936 kg (163 lb) 69.945 kg (154 lb 3.2 oz) 67.903 kg (149 lb 11.2 oz)    Exam: HEENT: [place], AT  General: Anxious  Cardiovascular: S1 S2  Respiratory: Expiratory wheezes +  Abdomen: Soft. Non tender  Neuro:Non Focal  Extremities; 1 + edema   Data Reviewed: Basic Metabolic Panel:  Recent Labs Lab 08/31/14 1651 09/01/14 0257 09/02/14 0402  NA 129* 133* 137  K 3.4* 3.7 3.6  CL 94* 95* 96*  CO2 23 26 32  GLUCOSE 167* 145* 103*  BUN 17 19 24*  CREATININE 1.17* 1.26* 1.38*  CALCIUM 8.7* 8.8* 8.7*   Liver Function Tests:  Recent Labs Lab 08/31/14 1651  AST 31  ALT 26  ALKPHOS 69  BILITOT 0.4  PROT 6.1*  ALBUMIN 2.7*   No results for input(s): LIPASE, AMYLASE in the last 168 hours. No results for input(s): AMMONIA in the last 168 hours. CBC:  Recent Labs Lab 08/31/14 1651 09/01/14 0257 09/02/14 0402  WBC 9.6 7.3 11.1*  NEUTROABS 9.2*  --  9.0*  HGB 9.2* 9.7* 9.5*  HCT 28.7* 29.9* 29.4*  MCV 84.7 84.6 84.8  PLT 410 392 369   Cardiac Enzymes:    Recent Labs Lab 08/31/14 1651 08/31/14 1950 09/01/14 0257  TROPONINI 0.34* 0.34*  0.34*   BNP (last 3 results)  Recent Labs  08/31/14 1651  BNP 733.0*    ProBNP (last 3 results) No results for input(s): PROBNP in the last 8760 hours.  CBG: No results for input(s): GLUCAP in the last 168 hours.  Recent Results (from the past 240 hour(s))  Blood culture (routine x 2)     Status: None (Preliminary result)   Collection Time: 08/31/14  4:52 PM  Result Value Ref Range Status   Specimen Description BLOOD LEFT ASSIST CONTROL  Final   Special Requests BOTTLES DRAWN AEROBIC AND ANAEROBIC 3ML  Final   Culture NO GROWTH 2 DAYS  Final   Report Status PENDING  Incomplete  Blood culture (routine x 2)     Status: None (Preliminary result)   Collection Time: 08/31/14  4:52 PM  Result Value Ref Range Status   Specimen Description BLOOD RIGHT ASSIST CONTROL  Final   Special Requests BOTTLES DRAWN AEROBIC AND ANAEROBIC 3ML  Final   Culture NO GROWTH 2 DAYS  Final   Report Status PENDING  Incomplete     Studies: No results found.  Scheduled Meds: . budesonide-formoterol  2 puff Inhalation BID  . clopidogrel  75 mg Oral Q breakfast  . enalapril  20 mg Oral Daily  . furosemide  20 mg Intravenous Q12H  . heparin  5,000 Units Subcutaneous 3 times per day  . ipratropium-albuterol  3 mL Nebulization Q4H  . pantoprazole  40 mg Oral Daily  . predniSONE  20 mg Oral Q breakfast  . vitamin B-12  100 mcg Oral Daily    Continuous Infusions:   Assessment/Plan: 1 Acute Shortness of breath secondary to COPD ECHO : EF 60-65% CXR : Pulmonary  Edema and Bilat Pleural effusions Change to po Lasix  Continue SVN;s and Prednisone 3 HTN: On Enalapril- Continue to monitor 4 Ca Lung with Liver mets: Consult Oncology  5 Hx of CVA -On Plavix    Code Status: Full  Family Communication: Grand daughter      [name]   09/02/2014, 8:30 AM  LOS: 2 days

## 2014-09-02 NOTE — Consult Note (Signed)
Reason for Consult: atrial fibrillation, shortness of breath Referring Physician:  Dr. Kandyce Rud Jo Frank is an 81 y.o. female.  HPI:  81 year old white female history of breast cancer 3 years ago on the right side she has had hypertension CVA COPD reflux symptoms recently complained of cough congestion shortness of breath was treated for URI and bronchitis with antibiotics inhalers without significant improvement. Patient events early had a chest x-ray which showed low lung mass CTs suggested probable lung cancer with mets to the liver. Patient found a presented martial which shortness of breath and was given oxygen as well as medical therapy and she has had significant improvement she schedule see Oncology for further evaluation of cancer. Patient now has to be treated for her lung inflammation prior to chemotherapy. Patient denies much chest pain no blackout spells of syncope has not noticed any palpitations or tachycardia but was found to be in atrial fibrillation.  Past Medical History  Diagnosis Date  . Hypertension   . CVA (cerebral infarction) 2010    Was on Plavix for 2 years, then switched to clopidogrel but d/c due to side effects ("sick")  . Nephrolithiasis     per patient, passes on her own  . Stroke   . COPD (chronic obstructive pulmonary disease)   . GERD (gastroesophageal reflux disease)   . Breast cancer 2013    breast cancer right-radiation-lumpectomy  . Lung cancer     Past Surgical History  Procedure Laterality Date  . Breast lumpectomy      left side  . Abdominal hysterectomy  1960s    "bleeding profusely"  . Cholecystectomy    . Hand surgery      s/p fall    Family History  Problem Relation Age of Onset  . Throat cancer Mother     heavy smoker & drinker, died at 79 yo (1978)  . Heart failure Father   . Hypertension Sister   . Stroke Sister     Social History:  reports that she quit smoking about 35 years ago. Her smoking use included Cigarettes. She  has a 15 pack-year smoking history. She has never used smokeless tobacco. She reports that she does not drink alcohol or use illicit drugs.  Allergies:  Allergies  Allergen Reactions  . Aspirin Shortness Of Breath, Swelling and Other (See Comments)    Pt states that she vomits blood.   Marland Kitchen Penicillins Anaphylaxis  . Nitrofuran Derivatives Nausea And Vomiting    Medications: I have reviewed the patient's current medications.  Results for orders placed or performed during the hospital encounter of 08/31/14 (from the past 48 hour(s))  Troponin I     Status: Abnormal   Collection Time: 08/31/14  7:50 PM  Result Value Ref Range   Troponin I 0.34 (H) <0.031 ng/mL    Comment: RESULTS PREVIOUSLY CALLED BY [facility] AT 1725 08/31/14.Marland KitchenMarland KitchenSMG        PERSISTENTLY INCREASED TROPONIN VALUES IN THE RANGE OF 0.04-0.49 ng/mL CAN BE SEEN IN:       -UNSTABLE ANGINA       -CONGESTIVE HEART FAILURE       -MYOCARDITIS       -CHEST TRAUMA       -ARRYHTHMIAS       -LATE PRESENTING MYOCARDIAL INFARCTION       -COPD   CLINICAL FOLLOW-UP RECOMMENDED.   Troponin I     Status: Abnormal   Collection Time: 09/01/14  2:57 AM  Result Value Ref Range  Troponin I 0.34 (H) <0.031 ng/mL    Comment: RESULTS PREVIOUSLY CALLED AT 1725 8.3.16 [facility]        PERSISTENTLY INCREASED TROPONIN VALUES IN THE RANGE OF 0.04-0.49 ng/mL CAN BE SEEN IN:       -UNSTABLE ANGINA       -CONGESTIVE HEART FAILURE       -MYOCARDITIS       -CHEST TRAUMA       -ARRYHTHMIAS       -LATE PRESENTING MYOCARDIAL INFARCTION       -COPD   CLINICAL FOLLOW-UP RECOMMENDED.   Basic metabolic panel     Status: Abnormal   Collection Time: 09/01/14  2:57 AM  Result Value Ref Range   Sodium 133 (L) 135 - 145 mmol/L   Potassium 3.7 3.5 - 5.1 mmol/L   Chloride 95 (L) 101 - 111 mmol/L   CO2 26 22 - 32 mmol/L   Glucose, Bld 145 (H) 65 - 99 mg/dL   BUN 19 6 - 20 mg/dL   Creatinine, Ser 1.26 (H) 0.44 - 1.00 mg/dL   Calcium 8.8 (L) 8.9 - 10.3 mg/dL    GFR calc non Af Amer 38 (L) >60 mL/min   GFR calc Af Amer 44 (L) >60 mL/min    Comment: (NOTE) The eGFR has been calculated using the CKD EPI equation. This calculation has not been validated in all clinical situations. eGFR's persistently <60 mL/min signify possible Chronic Kidney Disease.    Anion gap 12 5 - 15  CBC     Status: Abnormal   Collection Time: 09/01/14  2:57 AM  Result Value Ref Range   WBC 7.3 3.6 - 11.0 K/uL   RBC 3.54 (L) 3.80 - 5.20 MIL/uL   Hemoglobin 9.7 (L) 12.0 - 16.0 g/dL   HCT 29.9 (L) 35.0 - 47.0 %   MCV 84.6 80.0 - 100.0 fL   MCH 27.4 26.0 - 34.0 pg   MCHC 32.3 32.0 - 36.0 g/dL   RDW 16.5 (H) 11.5 - 14.5 %   Platelets 392 150 - 440 K/uL  CBC with Differential/Platelet     Status: Abnormal   Collection Time: 09/02/14  4:02 AM  Result Value Ref Range   WBC 11.1 (H) 3.6 - 11.0 K/uL   RBC 3.47 (L) 3.80 - 5.20 MIL/uL   Hemoglobin 9.5 (L) 12.0 - 16.0 g/dL   HCT 29.4 (L) 35.0 - 47.0 %   MCV 84.8 80.0 - 100.0 fL   MCH 27.3 26.0 - 34.0 pg   MCHC 32.2 32.0 - 36.0 g/dL   RDW 16.6 (H) 11.5 - 14.5 %   Platelets 369 150 - 440 K/uL   Neutrophils Relative % 81 %   Neutro Abs 9.0 (H) 1.4 - 6.5 K/uL   Lymphocytes Relative 10 %   Lymphs Abs 1.1 1.0 - 3.6 K/uL   Monocytes Relative 9 %   Monocytes Absolute 1.0 (H) 0.2 - 0.9 K/uL   Eosinophils Relative 0 %   Eosinophils Absolute 0.0 0 - 0.7 K/uL   Basophils Relative 0 %   Basophils Absolute 0.0 0 - 0.1 K/uL  Basic metabolic panel     Status: Abnormal   Collection Time: 09/02/14  4:02 AM  Result Value Ref Range   Sodium 137 135 - 145 mmol/L   Potassium 3.6 3.5 - 5.1 mmol/L   Chloride 96 (L) 101 - 111 mmol/L   CO2 32 22 - 32 mmol/L   Glucose, Bld 103 (H) 65 - 99 mg/dL  BUN 24 (H) 6 - 20 mg/dL   Creatinine, Ser 1.38 (H) 0.44 - 1.00 mg/dL   Calcium 8.7 (L) 8.9 - 10.3 mg/dL   GFR calc non Af Amer 34 (L) >60 mL/min   GFR calc Af Amer 39 (L) >60 mL/min    Comment: (NOTE) The eGFR has been calculated using the  CKD EPI equation. This calculation has not been validated in all clinical situations. eGFR's persistently <60 mL/min signify possible Chronic Kidney Disease.    Anion gap 9 5 - 15  TSH     Status: None   Collection Time: 09/02/14  4:02 AM  Result Value Ref Range   TSH 0.738 0.350 - 4.500 uIU/mL  Culture, expectorated sputum-assessment     Status: None   Collection Time: 09/02/14  3:23 PM  Result Value Ref Range   Specimen Description EXPECTORATED SPUTUM    Special Requests NONE    Sputum evaluation THIS SPECIMEN IS ACCEPTABLE FOR SPUTUM CULTURE    Report Status 09/02/2014 FINAL     Dg Chest 1 View  09/01/2014   CLINICAL DATA:  Acute onset of dyspnea.  Initial encounter.  EXAM: CHEST  1 VIEW  COMPARISON:  Chest radiograph performed 08/31/2014  FINDINGS: Bilateral central airspace opacification is perhaps slightly worsened from the prior study and concerning for pulmonary edema. Small bilateral pleural effusions are noted. No pneumothorax is seen.  The cardiomediastinal silhouette is borderline enlarged. No acute osseous abnormalities are identified.  IMPRESSION: Slightly worsening pulmonary edema noted, with small bilateral pleural effusions. Borderline cardiomegaly.   Electronically Signed   By: Garald Balding M.D.   On: 09/01/2014 01:33   Dg Chest Port 1 View  09/01/2014   CLINICAL DATA:  History of hypertension stroke COPD gastroesophageal reflux and breast cancer, recently diagnosed with metastatic lung cancer involving the liver for the past 2-3 weeks with progressive worsening shortness of breath over that time  EXAM: PORTABLE CHEST - 1 VIEW  COMPARISON:  09/01/2014 at 1:18  FINDINGS: Stable mild cardiac enlargement with central vascular congestion. Extensive bilateral perihilar hazy opacities. Small pleural effusions. Interstitial prominence.  IMPRESSION: Congestive heart failure with moderately severe pulmonary edema stable from prior study   Electronically Signed   By: Skipper Cliche  M.D.   On: 09/01/2014 08:27   Dg Chest Portable 1 View  08/31/2014   CLINICAL DATA:  Progressive shortness of breath. History of lung cancer. Liver cancer. Breast cancer. COPD.  EXAM: PORTABLE CHEST - 1 VIEW  COMPARISON:  CT of the chest 08/18/2014.  FINDINGS: The heart is enlarged. Mild edema is present. Bilateral effusions are noted. Bibasilar airspace disease is evident. Nodular disease in the right upper lobe is partially obscured by edema.  IMPRESSION: 1. Cardiomegaly with mild edema and bilateral effusions compatible with congestive heart failure. 2. Right upper lobe nodule.   Electronically Signed   By: San Morelle M.D.   On: 08/31/2014 17:14    Review of Systems  Constitutional: Positive for malaise/fatigue.  HENT: Positive for congestion.   Eyes: Negative.   Respiratory: Positive for shortness of breath.   Cardiovascular: Negative.   Gastrointestinal: Negative.   Genitourinary: Negative.   Musculoskeletal: Negative.   Skin: Negative.   Neurological: Positive for weakness.  Endo/Heme/Allergies: Negative.   Psychiatric/Behavioral: Negative.    Blood pressure 129/58, pulse 149, temperature 97.5 F (36.4 C), temperature source Oral, resp. rate 17, height _0  (1.549 m), weight 67.903 kg (149 lb 11.2 oz), SpO2 95 %. Physical Exam  Nursing  note and vitals reviewed. Constitutional: She is oriented to person, place, and time. She appears well-developed and well-nourished.  HENT:  Head: Normocephalic and atraumatic.  Right Ear: External ear normal.  Eyes: Conjunctivae and EOM are normal. Pupils are equal, round, and reactive to light.  Neck: Normal range of motion. Neck supple.  Cardiovascular: S1 normal, S2 normal, intact distal pulses and normal pulses.  An irregularly irregular rhythm present. Exam reveals distant heart sounds.   Murmur heard.  Systolic murmur is present with a grade of 2/6  Respiratory: Effort normal. She has wheezes.  GI: Soft. Bowel sounds are normal.   Musculoskeletal: Normal range of motion.  Neurological: She is alert and oriented to person, place, and time. She has normal reflexes.  Skin: Skin is warm.  Psychiatric: She has a normal mood and affect.    Assessment/Plan:  atrial fibrillation  hypertension  COPD  bronchitis  shortness of breath  lung cancer  hyperlipidemia  possible congestive heart failure  anemia  GERD . PLAN  agree with admitted to telemetry  continue Plavix for history of CV  agree with diltiazem for rate control for atrial fibrillation  consider anticoagulation short-term I am not sure I will proceed to long-term anticoagulation until lung    cancer is been dealt with  I agree with antibiotics for possible bronchitis  continue steroid taper for bronchitis  hypertension control with enalapril diltiazem  agree with inhalers as necessary  GERD continue Protonix therapy is necessary  consider echocardiogram for evaluation of atrial fibrillation shortness of breath  continue conservative therapy for atrial fibrillation at this point  [place] D. 09/02/2014, 4:58 PM

## 2014-09-02 NOTE — Telephone Encounter (Signed)
Consult - Hand delivered to Georgeanne Nim, NP.    Magda Paganini had already seen patient earlier in the day. Received phone call prior to answering service request for consult.

## 2014-09-02 NOTE — Care Management (Signed)
patient has had issues with elevated heart rate and is to received oral cardizem.   She remains extremely short of breath with minimal exertion.  Oncology consult is pending.  It is verbally reported that patient was to have been admitted to [facility] on 8/8 for chemo.

## 2014-09-02 NOTE — Progress Notes (Signed)
Initial Nutrition Assessment  DOCUMENTATION CODES:   Non-severe (moderate) malnutrition in context of acute illness/injury  INTERVENTION:   Meals and Snacks: Cater to patient preferences Medical Food Supplement Therapy: discussed nutritional supplements with pt; pt declined receiving at this time; discussed use of nutritional supplements after discharge if po intake remains poor, especially after initiation of chemo. If po intake inadequate on follow-up, will recommend addition of nutritional supplement.    NUTRITION DIAGNOSIS:   Inadequate oral intake related to  (early satiety) as evidenced by per patient/family report.   GOAL:   Patient will meet greater than or equal to 90% of their needs   MONITOR:    (Energy Intake, Anthropometrics, Digestive system, Electrolyte/Renal profile)  REASON FOR ASSESSMENT:   Diagnosis (CHF)    ASSESSMENT:    Pt admitted with acute CHF exacerbation; noted recent dx of lung cancer with mets to the liver (dx within past 2-3 weeks) with plans to start chemo  Past Medical History  Diagnosis Date  . Hypertension   . CVA (cerebral infarction) 2010    Was on Plavix for 2 years, then switched to clopidogrel but d/c due to side effects ("sick")  . Nephrolithiasis     per patient, passes on her own  . Stroke   . COPD (chronic obstructive pulmonary disease)   . GERD (gastroesophageal reflux disease)   . Breast cancer 2013    breast cancer right-radiation-lumpectomy  . Lung cancer     Diet Order:  Diet 2 gram sodium Room service appropriate?: Yes; Fluid consistency:: Thin   Energy Intake: recorded po intake bites yesterday, 20% at breakfast this AM, 100% at lunch; pt reports appetite is improving  Food and nutrition related history: pt reports poor intake for the past month due to early satiety, poor appetite; pt reports she would eat few bites and then would be full. Pt reports she does the cooking at home (cares for her husband who has  Parkinsons) but recently has not been able to do this due to SOB and weakness  Electrolyte and Renal Profile:  Recent Labs Lab 08/31/14 1651 09/01/14 0257 09/02/14 0402  BUN 17 19 24*  CREATININE 1.17* 1.26* 1.38*  NA 129* 133* 137  K 3.4* 3.7 3.6   Glucose Profile: No results for input(s): GLUCAP in the last 72 hours. Protein Profile:   Recent Labs Lab 08/31/14 1651  ALBUMIN 2.7*   Nutritional Anemia Profile:  CBC Latest Ref Rng 09/02/2014 09/01/2014 08/31/2014  WBC 3.6 - 11.0 K/uL 11.1(H) 7.3 9.6  Hemoglobin 12.0 - 16.0 g/dL 9.5(L) 9.7(L) 9.2(L)  Hematocrit 35.0 - 47.0 % 29.4(L) 29.9(L) 28.7(L)  Platelets 150 - 440 K/uL 369 392 410    Meds: prednisone, lasix  Skin:  Reviewed, no issues  Last BM:  8/5 loose   Nutrition focused physical exam: Nutrition-Focused physical exam completed. Findings are wdl fat depletion, mild muscle depletion, and mild edema.    Height:   Ht Readings from Last 1 Encounters:  08/31/14 '5\' 1"'$  (1.549 m)    Weight: pt reports she weighed 155 pounds 4 weeks ago; noted weight trend. 3.9% wt loss  Wt Readings from Last 1 Encounters:  09/02/14 149 lb 11.2 oz (67.903 kg)   Filed Weights   08/31/14 1640 09/01/14 0427 09/02/14 0344  Weight: 163 lb (73.936 kg) 154 lb 3.2 oz (69.945 kg) 149 lb 11.2 oz (67.903 kg)   Wt Readings from Last 10 Encounters:  09/02/14 149 lb 11.2 oz (67.903 kg)  08/30/14 155  lb 3.3 oz (70.4 kg)  08/25/14 150 lb 12.7 oz (68.4 kg)  08/18/14 148 lb 5.9 oz (67.3 kg)  08/17/14 150 lb 2.1 oz (68.1 kg)  08/12/14 151 lb (68.493 kg)  08/08/14 151 lb 10.8 oz (68.799 kg)  07/29/14 154 lb (69.854 kg)  12/06/12 155 lb 8 oz (70.534 kg)    BMI:  Body mass index is 28.3 kg/(m^2).  Estimated Nutritional Needs:   Kcal:  1240-1465 kcals (BEE 867, 1.3 AF, 1.1-1.3 IF) using IBW 48 kg  Protein:  53-67 g (1.1-1.4 g/kg)   Fluid:  1200-1440 mL (25-30 ml/kg)   MODERATE Care Level  Kerman Passey MS, RD, LDN [phone] Pager

## 2014-09-02 NOTE — Progress Notes (Signed)
Patient hr in the 120's to 140's and sustaining, MD informed and order start EKG will continue to monitor

## 2014-09-03 LAB — BASIC METABOLIC PANEL
Anion gap: 8 (ref 5–15)
BUN: 26 mg/dL — ABNORMAL HIGH (ref 6–20)
CALCIUM: 8.7 mg/dL — AB (ref 8.9–10.3)
CHLORIDE: 94 mmol/L — AB (ref 101–111)
CO2: 35 mmol/L — ABNORMAL HIGH (ref 22–32)
Creatinine, Ser: 1.36 mg/dL — ABNORMAL HIGH (ref 0.44–1.00)
GFR calc Af Amer: 40 mL/min — ABNORMAL LOW (ref >60)
GFR calc non Af Amer: 35 mL/min — ABNORMAL LOW (ref >60)
Glucose, Bld: 94 mg/dL (ref 65–99)
POTASSIUM: 3.7 mmol/L (ref 3.5–5.1)
SODIUM: 137 mmol/L (ref 135–145)

## 2014-09-03 LAB — CBC WITH DIFFERENTIAL/PLATELET
BASOS ABS: 0 10*3/uL (ref 0–0.1)
Basophils Relative: 0 %
Eosinophils Absolute: 0.1 10*3/uL (ref 0–0.7)
Eosinophils Relative: 1 %
HCT: 29.5 % — ABNORMAL LOW (ref 35.0–47.0)
HEMOGLOBIN: 9.4 g/dL — AB (ref 12.0–16.0)
Lymphocytes Relative: 10 %
Lymphs Abs: 1.2 10*3/uL (ref 1.0–3.6)
MCH: 27.1 pg (ref 26.0–34.0)
MCHC: 32 g/dL (ref 32.0–36.0)
MCV: 84.7 fL (ref 80.0–100.0)
MONO ABS: 1.1 10*3/uL — AB (ref 0.2–0.9)
Monocytes Relative: 10 %
Neutro Abs: 8.8 10*3/uL — ABNORMAL HIGH (ref 1.4–6.5)
Neutrophils Relative %: 79 %
PLATELETS: 364 10*3/uL (ref 150–440)
RBC: 3.48 MIL/uL — ABNORMAL LOW (ref 3.80–5.20)
RDW: 16.8 % — ABNORMAL HIGH (ref 11.5–14.5)
WBC: 11.2 10*3/uL — AB (ref 3.6–11.0)

## 2014-09-03 NOTE — Progress Notes (Signed)
Pt is refusing Bipap. She doesn't feel like she needs to use it

## 2014-09-03 NOTE — Progress Notes (Signed)
Pt requested not to wear Bipap tonight

## 2014-09-03 NOTE — Progress Notes (Signed)
PROGRESS NOTE  Jo Frank ZOX:[id number] DOB: [birth year] DOA: 08/31/2014 PCP: [name], MD  Subjective:  81 y/o f  With hx of HTN, CVA, COPD, Recent diagnosis of Ca lung with mets admitted with progressive shortness of breath, and cough. No improvement with out pt treatment of tapering Prednisone and antibiotics. Pt went into A-fib yesterday and was started on Po cardizem. This am breathing better, Still has a cough    Objective: BP 97/46 mmHg  Pulse 115  Temp(Src) 97.9 F (36.6 C) (Oral)  Resp 20  Ht '5\' 1"'$  (1.549 m)  Wt 68.32 kg (150 lb 9.9 oz)  BMI 28.47 kg/m2  SpO2 96%  Intake/Output Summary (Last 24 hours) at 09/03/14 1141 Last data filed at 09/03/14 1004  Gross per 24 hour  Intake    990 ml  Output   2100 ml  Net  -1110 ml   Filed Weights   09/01/14 0427 09/02/14 0344 09/03/14 0507  Weight: 69.945 kg (154 lb 3.2 oz) 67.903 kg (149 lb 11.2 oz) 68.32 kg (150 lb 9.9 oz)    Exam: HEENT: [place], AT  General: Not in distress  Cardiovascular: S1 S2  Respiratory: Expiratory wheezes +  Abdomen: Soft. Non tender  Neuro:Non Focal  Extremities; 1 + edema   Data Reviewed: Basic Metabolic Panel:  Recent Labs Lab 08/31/14 1651 09/01/14 0257 09/02/14 0402 09/03/14 0511  NA 129* 133* 137 137  K 3.4* 3.7 3.6 3.7  CL 94* 95* 96* 94*  CO2 23 26 32 35*  GLUCOSE 167* 145* 103* 94  BUN 17 19 24* 26*  CREATININE 1.17* 1.26* 1.38* 1.36*  CALCIUM 8.7* 8.8* 8.7* 8.7*   Liver Function Tests:  Recent Labs Lab 08/31/14 1651  AST 31  ALT 26  ALKPHOS 69  BILITOT 0.4  PROT 6.1*  ALBUMIN 2.7*   No results for input(s): LIPASE, AMYLASE in the last 168 hours. No results for input(s): AMMONIA in the last 168 hours. CBC:  Recent Labs Lab 08/31/14 1651 09/01/14 0257 09/02/14 0402 09/03/14 0511  WBC 9.6 7.3 11.1* 11.2*  NEUTROABS 9.2*  --  9.0* 8.8*  HGB 9.2* 9.7* 9.5* 9.4*  HCT 28.7* 29.9* 29.4* 29.5*  MCV 84.7 84.6 84.8 84.7  PLT 410 392 369 364    Cardiac Enzymes:    Recent Labs Lab 08/31/14 1651 08/31/14 1950 09/01/14 0257  TROPONINI 0.34* 0.34* 0.34*   BNP (last 3 results)  Recent Labs  08/31/14 1651  BNP 733.0*    ProBNP (last 3 results) No results for input(s): PROBNP in the last 8760 hours.  CBG: No results for input(s): GLUCAP in the last 168 hours.  Recent Results (from the past 240 hour(s))  Blood culture (routine x 2)     Status: None (Preliminary result)   Collection Time: 08/31/14  4:52 PM  Result Value Ref Range Status   Specimen Description BLOOD LEFT ASSIST CONTROL  Final   Special Requests BOTTLES DRAWN AEROBIC AND ANAEROBIC 3ML  Final   Culture NO GROWTH 3 DAYS  Final   Report Status PENDING  Incomplete  Blood culture (routine x 2)     Status: None (Preliminary result)   Collection Time: 08/31/14  4:52 PM  Result Value Ref Range Status   Specimen Description BLOOD RIGHT ASSIST CONTROL  Final   Special Requests BOTTLES DRAWN AEROBIC AND ANAEROBIC 3ML  Final   Culture NO GROWTH 3 DAYS  Final   Report Status PENDING  Incomplete  Culture, expectorated sputum-assessment  Status: None   Collection Time: 09/02/14  3:23 PM  Result Value Ref Range Status   Specimen Description EXPECTORATED SPUTUM  Final   Special Requests NONE  Final   Sputum evaluation THIS SPECIMEN IS ACCEPTABLE FOR SPUTUM CULTURE  Final   Report Status 09/02/2014 FINAL  Final  Culture, respiratory (NON-Expectorated)     Status: None (Preliminary result)   Collection Time: 09/02/14  3:23 PM  Result Value Ref Range Status   Specimen Description EXPECTORATED SPUTUM  Final   Special Requests NONE Reflexed from [id number]  Final   Gram Stain PENDING  Incomplete   Culture HOLDING FOR POSSIBLE PATHOGEN  Final   Report Status PENDING  Incomplete     Studies: No results found.  Scheduled Meds: . budesonide-formoterol  2 puff Inhalation BID  . clopidogrel  75 mg Oral Q breakfast  . diltiazem  120 mg Oral Daily  . enalapril  20  mg Oral Daily  . furosemide  40 mg Oral Daily  . heparin  5,000 Units Subcutaneous 3 times per day  . ipratropium-albuterol  3 mL Nebulization Q6H  . levofloxacin (LEVAQUIN) IV  250 mg Intravenous Q24H  . pantoprazole  40 mg Oral Daily  . predniSONE  20 mg Oral Q breakfast  . vitamin B-12  100 mcg Oral Daily    Continuous Infusions:   Assessment/Plan: 1 Acute Shortness of breath secondary to COPD and Bronchitis Continue IV Levaquin and Prednisone ECHO : EF 60-65% CXR : Pulmonary  Edema and Bilat Pleural effusions Continue SVN's, Symbicort  and O2  2 Paroxysmal A-fib: On Cardizem- Appreciate help from Cardiology 3 HTN: On Enalapril- Continue to monitor 4 Ca Lung with Liver mets: Oncology following  5 Hx of CVA -On Plavix  6 Physical Therapy   Code Status: Full  Family Communication: Grand daughter      [name]   09/03/2014, 11:41 AM  LOS: 3 days

## 2014-09-04 DIAGNOSIS — J441 Chronic obstructive pulmonary disease with (acute) exacerbation: Secondary | ICD-10-CM | POA: Insufficient documentation

## 2014-09-04 LAB — CBC WITH DIFFERENTIAL/PLATELET
Basophils Absolute: 0 10*3/uL (ref 0–0.1)
Basophils Relative: 0 %
Eosinophils Absolute: 0.1 10*3/uL (ref 0–0.7)
Eosinophils Relative: 1 %
HCT: 28.1 % — ABNORMAL LOW (ref 35.0–47.0)
Hemoglobin: 9.2 g/dL — ABNORMAL LOW (ref 12.0–16.0)
LYMPHS ABS: 1 10*3/uL (ref 1.0–3.6)
LYMPHS PCT: 9 %
MCH: 27.9 pg (ref 26.0–34.0)
MCHC: 32.7 g/dL (ref 32.0–36.0)
MCV: 85.2 fL (ref 80.0–100.0)
MONOS PCT: 9 %
Monocytes Absolute: 1 10*3/uL — ABNORMAL HIGH (ref 0.2–0.9)
NEUTROS ABS: 8.7 10*3/uL — AB (ref 1.4–6.5)
NEUTROS PCT: 81 %
Platelets: 335 10*3/uL (ref 150–440)
RBC: 3.3 MIL/uL — AB (ref 3.80–5.20)
RDW: 16.4 % — AB (ref 11.5–14.5)
WBC: 10.8 10*3/uL (ref 3.6–11.0)

## 2014-09-04 LAB — BASIC METABOLIC PANEL
ANION GAP: 9 (ref 5–15)
BUN: 29 mg/dL — ABNORMAL HIGH (ref 6–20)
CO2: 35 mmol/L — AB (ref 22–32)
CREATININE: 1.25 mg/dL — AB (ref 0.44–1.00)
Calcium: 8.6 mg/dL — ABNORMAL LOW (ref 8.9–10.3)
Chloride: 92 mmol/L — ABNORMAL LOW (ref 101–111)
GFR, EST AFRICAN AMERICAN: 44 mL/min — AB (ref >60)
GFR, EST NON AFRICAN AMERICAN: 38 mL/min — AB (ref >60)
Glucose, Bld: 108 mg/dL — ABNORMAL HIGH (ref 65–99)
POTASSIUM: 3.8 mmol/L (ref 3.5–5.1)
Sodium: 136 mmol/L (ref 135–145)

## 2014-09-04 NOTE — Clinical Social Work Note (Signed)
CSW closed and notified RNCM.  CSW signing off unless social work needs arise.

## 2014-09-04 NOTE — Evaluation (Signed)
Physical Therapy Evaluation Patient Details Name: Jo Frank MRN: [id number] DOB: [birth year] Today's Date: 09/04/2014   History of Present Illness  81 yo female with multiple areas of CA, mets in liver and lungs from breast CA.  Admitted with SOB and cough, with B pleural effusion and B pulmonary edema.  Clinical Impression  Pt was able to be assisted to chair with 3L O2 via nasal cannula and a limited amount of help on RW.  Her plan is to go home if able but is not going to have assistance from husband who is on hospice care himself.  Will expect her to go to SNF for strengthening first.    Follow Up Recommendations SNF    Equipment Recommendations  Rolling walker with 5" wheels    Recommendations for Other Services       Precautions / Restrictions Precautions Precautions: Fall (telemetry) Restrictions Weight Bearing Restrictions: No      Mobility  Bed Mobility Overal bed mobility: Needs Assistance Bed Mobility: Supine to Sit     Supine to sit: Min assist        Transfers Overall transfer level: Needs assistance Equipment used: Rolling walker (2 wheeled);1 person hand held assist Transfers: Sit to/from [facility] Sit to Stand: Mod assist Stand pivot transfers: Min assist       General transfer comment: reminders for hand placement  Ambulation/Gait Ambulation/Gait assistance: Min assist Ambulation Distance (Feet): 15 Feet Assistive device: Rolling walker (2 wheeled);1 person hand held assist Gait Pattern/deviations: Step-to pattern;Shuffle;Wide base of support Gait velocity: reduced Gait velocity interpretation: Below normal speed for age/gender General Gait Details: awkward and pt concerned about all her lines and her husband tripping on them  Stairs            Wheelchair Mobility    Modified Rankin (Stroke Patients Only)       Balance                                             Pertinent Vitals/Pain       Home Living Family/patient expects to be discharged to:: Private residence Living Arrangements: Spouse/significant other Available Help at Discharge: Family;Available PRN/intermittently Type of Home: House Home Access: Stairs to enter Entrance Stairs-Rails: Right Entrance Stairs-Number of Steps: 3 Home Layout: One level Home Equipment: Shower seat - built in;Walker - 2 wheels;Walker - 4 wheels      Prior Function Level of Independence: Independent with assistive device(s)               Hand Dominance   Dominant Hand: Right    Extremity/Trunk Assessment   Upper Extremity Assessment: Overall WFL for tasks assessed           Lower Extremity Assessment: Generalized weakness      Cervical / Trunk Assessment: Kyphotic  Communication   Communication: No difficulties  Cognition Arousal/Alertness: Awake/alert Behavior During Therapy: WFL for tasks assessed/performed Overall Cognitive Status: Within Functional Limits for tasks assessed                      General Comments General comments (skin integrity, edema, etc.): Pt has a minor amount of help needed for walking but her husband has hospice caregiving and won't be able to assist her.  Pt will need help for herself to go home.    Exercises  Assessment/Plan    PT Assessment Patient needs continued PT services  PT Diagnosis Difficulty walking;Generalized weakness   PT Problem List Decreased strength;Decreased range of motion;Decreased activity tolerance;Decreased balance;Decreased mobility;Decreased coordination;Decreased knowledge of use of DME;Cardiopulmonary status limiting activity  PT Treatment Interventions DME instruction;Gait training;Stair training;Functional mobility training;Therapeutic activities;Therapeutic exercise;Balance training;Neuromuscular re-education;Patient/family education   PT Goals (Current goals can be found in the Care Plan section) Acute Rehab PT Goals Patient  Stated Goal: to walk better PT Goal Formulation: With patient Time For Goal Achievement: 09/18/14 Potential to Achieve Goals: Good    Frequency Min 2X/week   Barriers to discharge Decreased caregiver support;Inaccessible home environment      Co-evaluation               End of Session Equipment Utilized During Treatment: Gait belt;Oxygen Activity Tolerance: Patient tolerated treatment well;Patient limited by fatigue Patient left: in chair;with call bell/phone within reach Nurse Communication: Mobility status         Time: 2979-8921 PT Time Calculation (min) (ACUTE ONLY): 26 min   Charges:   PT Evaluation $Initial PT Evaluation Tier I: 1 Procedure PT Treatments $Gait Training: 8-22 mins   PT G Codes:        Ramond Dial September 27, 2014, 3:50 PM   Mee Hives, PT MS Acute Rehab Dept. Number: [facility] [phone] and [place] [phone]

## 2014-09-04 NOTE — Progress Notes (Signed)
PROGRESS NOTE  Jo Frank JKK:[id number] DOB: 13-Sep-1928 DOA: 08/31/2014 PCP: [name], MD  Subjective:  81 y/o f  With hx of HTN, CVA, COPD, Recent diagnosis of Ca lung with mets admitted with progressive shortness of breath, and cough. No improvement with out pt treatment of tapering Prednisone and antibiotics. This am breathing better, Still has a cough with dark brown sputum    Objective: BP 97/74 mmHg  Pulse 95  Temp(Src) 97.7 F (36.5 C) (Oral)  Resp 20  Ht '5\' 1"'$  (1.549 m)  Wt 66.815 kg (147 lb 4.8 oz)  BMI 27.85 kg/m2  SpO2 99%  Intake/Output Summary (Last 24 hours) at 09/04/14 1057 Last data filed at 09/04/14 0950  Gross per 24 hour  Intake    480 ml  Output   1350 ml  Net   -870 ml   Filed Weights   09/02/14 0344 09/03/14 0507 09/04/14 0414  Weight: 67.903 kg (149 lb 11.2 oz) 68.32 kg (150 lb 9.9 oz) 66.815 kg (147 lb 4.8 oz)    Exam: HEENT: [place], AT  General: Not in distress  Cardiovascular: S1 S2  Respiratory: Scattered xxpiratory wheezes +  Abdomen: Soft. Non tender  Neuro:Non Focal  Extremities; 1 + edema   Data Reviewed: Basic Metabolic Panel:  Recent Labs Lab 08/31/14 1651 09/01/14 0257 09/02/14 0402 09/03/14 0511 09/04/14 0443  NA 129* 133* 137 137 136  K 3.4* 3.7 3.6 3.7 3.8  CL 94* 95* 96* 94* 92*  CO2 23 26 32 35* 35*  GLUCOSE 167* 145* 103* 94 108*  BUN 17 19 24* 26* 29*  CREATININE 1.17* 1.26* 1.38* 1.36* 1.25*  CALCIUM 8.7* 8.8* 8.7* 8.7* 8.6*   Liver Function Tests:  Recent Labs Lab 08/31/14 1651  AST 31  ALT 26  ALKPHOS 69  BILITOT 0.4  PROT 6.1*  ALBUMIN 2.7*   No results for input(s): LIPASE, AMYLASE in the last 168 hours. No results for input(s): AMMONIA in the last 168 hours. CBC:  Recent Labs Lab 08/31/14 1651 09/01/14 0257 09/02/14 0402 09/03/14 0511 09/04/14 0443  WBC 9.6 7.3 11.1* 11.2* 10.8  NEUTROABS 9.2*  --  9.0* 8.8* 8.7*  HGB 9.2* 9.7* 9.5* 9.4* 9.2*  HCT 28.7* 29.9* 29.4*  29.5* 28.1*  MCV 84.7 84.6 84.8 84.7 85.2  PLT 410 392 369 364 335   Cardiac Enzymes:    Recent Labs Lab 08/31/14 1651 08/31/14 1950 09/01/14 0257  TROPONINI 0.34* 0.34* 0.34*   BNP (last 3 results)  Recent Labs  08/31/14 1651  BNP 733.0*    ProBNP (last 3 results) No results for input(s): PROBNP in the last 8760 hours.  CBG: No results for input(s): GLUCAP in the last 168 hours.  Recent Results (from the past 240 hour(s))  Blood culture (routine x 2)     Status: None (Preliminary result)   Collection Time: 08/31/14  4:52 PM  Result Value Ref Range Status   Specimen Description BLOOD LEFT ASSIST CONTROL  Final   Special Requests BOTTLES DRAWN AEROBIC AND ANAEROBIC 3ML  Final   Culture NO GROWTH 3 DAYS  Final   Report Status PENDING  Incomplete  Blood culture (routine x 2)     Status: None (Preliminary result)   Collection Time: 08/31/14  4:52 PM  Result Value Ref Range Status   Specimen Description BLOOD RIGHT ASSIST CONTROL  Final   Special Requests BOTTLES DRAWN AEROBIC AND ANAEROBIC 3ML  Final   Culture NO GROWTH 3 DAYS  Final  Report Status PENDING  Incomplete  Culture, expectorated sputum-assessment     Status: None   Collection Time: 09/02/14  3:23 PM  Result Value Ref Range Status   Specimen Description EXPECTORATED SPUTUM  Final   Special Requests NONE  Final   Sputum evaluation THIS SPECIMEN IS ACCEPTABLE FOR SPUTUM CULTURE  Final   Report Status 09/02/2014 FINAL  Final  Culture, respiratory (NON-Expectorated)     Status: None (Preliminary result)   Collection Time: 09/02/14  3:23 PM  Result Value Ref Range Status   Specimen Description EXPECTORATED SPUTUM  Final   Special Requests NONE Reflexed from [id number]  Final   Gram Stain   Final    GOOD SPECIMEN - 80-90% WBCS MODERATE WBC SEEN MODERATE GRAM POSITIVE COCCI IN CLUSTERS IN PAIRS FEW GRAM NEGATIVE RODS FEW GRAM NEGATIVE COCCOBACILLI    Culture HOLDING FOR POSSIBLE PATHOGEN  Final   Report  Status PENDING  Incomplete     Studies: No results found.  Scheduled Meds: . budesonide-formoterol  2 puff Inhalation BID  . clopidogrel  75 mg Oral Q breakfast  . diltiazem  120 mg Oral Daily  . enalapril  20 mg Oral Daily  . furosemide  40 mg Oral Daily  . heparin  5,000 Units Subcutaneous 3 times per day  . ipratropium-albuterol  3 mL Nebulization Q6H  . levofloxacin (LEVAQUIN) IV  250 mg Intravenous Q24H  . pantoprazole  40 mg Oral Daily  . predniSONE  20 mg Oral Q breakfast  . vitamin B-12  100 mcg Oral Daily    Continuous Infusions:   Assessment/Plan: 1 Acute Shortness of breath secondary to COPD and Bronchitis Continue IV Levaquin and Prednisone ECHO : EF 60-65% CXR : Pulmonary  Edema and Bilat Pleural effusions Continue SVN's, Symbicort  and O2  2 Paroxysmal A-fib: On Cardizem- Appreciate help from Cardiology 3 HTN: On Enalapril- Continue to monitor 4 Ca Lung with Liver mets: Oncology following  Plan was to start Chemo - Will d/w Dr. Oliva Bustard  5 Hx of CVA -On Plavix  6 Physical Therapy   Code Status: Full  Family Communication: Grand daughter      [name]   09/04/2014, 10:57 AM  LOS: 4 days

## 2014-09-04 NOTE — Progress Notes (Signed)
Patient resting in bed at this time.  Family at bedside at this time, mood calm, PT consult in progress, pt up to the chair with PT assist, condition stable

## 2014-09-04 NOTE — Consult Note (Signed)
[place]  [phone]  [phone]     Jo Frank DOB: [birth year]  MR#: [id number]  DJM#:[id number]  Patient Care Team: [name], MD as PCP - General (Internal Medicine)  CHIEF COMPLAINT:  Chief Complaint  Patient presents with  . Shortness of Breath   Patient with recent newly diagnosed adenocarcinoma from a liver mass. Most likely lung cancer versus possibility of cholangiocarcinoma. Patient with significant past medical history of COPD as well as breast cancer, triple negative disease from September 2013.  INTERVAL HISTORY:  Jo Frank was admitted with progressive shortness of breath, cough, and weakness. Jo Frank was initially evaluated by Dr. Oliva Bustard on 08/31/2014 for metastatic adenocarcinoma of the lung with metastases to the liver. These results have been discussed as an outpatient with patient and family. Jo Frank reports feeling greatly improved today but remains weak and with shortness of breath on exertion. Jo Frank does report Jo Frank is able to move around in the bed without shortness of breath, family also notices Jo Frank is resting more comfortably.  REVIEW OF SYSTEMS:   Review of Systems  Constitutional: Positive for malaise/fatigue. Negative for fever, chills, weight loss and diaphoresis.  HENT: Negative for congestion, ear discharge, ear pain, hearing loss, nosebleeds, sore throat and tinnitus.   Eyes: Negative for blurred vision, double vision, photophobia, pain, discharge and redness.  Respiratory: Positive for cough, sputum production, shortness of breath and wheezing. Negative for hemoptysis and stridor.   Cardiovascular: Positive for orthopnea. Negative for chest pain, palpitations, claudication, leg swelling and PND.  Gastrointestinal: Negative for heartburn, nausea, vomiting, abdominal pain, diarrhea, constipation, blood in stool and melena.  Genitourinary: Negative.   Musculoskeletal: Negative.   Skin: Negative.   Neurological:  Positive for weakness. Negative for dizziness, tingling, focal weakness, seizures and headaches.  Endo/Heme/Allergies: Does not bruise/bleed easily.  Psychiatric/Behavioral: Negative for depression. The patient is not nervous/anxious and does not have insomnia.     As per HPI. Otherwise, a complete review of systems is negatve.  ONCOLOGY HISTORY: Oncology History   1.  Diagnosis of right breast cancer T1 cN0 M0 tumor triple negative disease status post partial mastectomy in September 2013.  Patient underwent lumpectomy radiation therapy there is no record of evaluation by oncologist.  2pT1c pN0 (sn) cM0 triple negative grade 2 invasive carcinoma of the right breast lumpectomy and sentinel node study on 10/11/2011. Tumor size 1.8 cm, grade 2, margins uninvolved by invasive carcinoma.  DCIS present, closest margin is 5 mm. 2 sentinel lymph nodes negative. ER negative (0%), PR negative (0%), HER-2/neu FISH negative (HER2/CEP17 ratio 1.16). 3.  CT scan of the chest revealed multiple lung nodules and the one nodule in July of . 4.  Liver biopsy is consistent with adenocarcinoma most likely lung cancer versus cholangiocarcinoma breast cancer has been ruled out (July, 2016)     Breast CA   10/09/2011 Initial Diagnosis Breast CA stage IC triple negative disease status post lumpectomy and radiation therapy    PAST MEDICAL HISTORY: Past Medical History  Diagnosis Date  . Hypertension   . CVA (cerebral infarction) 2010    Was on Plavix for 2 years, then switched to clopidogrel but d/c due to side effects ("sick")  . Nephrolithiasis     per patient, passes on her own  . Stroke   . COPD (chronic obstructive pulmonary disease)   . GERD (gastroesophageal reflux disease)   . Breast cancer 2013    breast cancer right-radiation-lumpectomy  . Lung cancer  PAST SURGICAL HISTORY: Past Surgical History  Procedure Laterality Date  . Breast lumpectomy      left side  . Abdominal hysterectomy   1960s    "bleeding profusely"  . Cholecystectomy    . Hand surgery      s/p fall    FAMILY HISTORY Family History  Problem Relation Age of Onset  . Throat cancer Mother     heavy smoker & drinker, died at 79 yo (1978)  . Heart failure Father   . Hypertension Sister   . Stroke Sister     GYNECOLOGIC HISTORY:  No LMP recorded. Patient has had a hysterectomy.     ADVANCED DIRECTIVES:    HEALTH MAINTENANCE: History  Substance Use Topics  . Smoking status: Former Smoker -- 0.50 packs/day for 30 years    Types: Cigarettes    Quit date: 12/07/1978  . Smokeless tobacco: Never Used  . Alcohol Use: No     Colonoscopy:  PAP:  Bone density:  Lipid panel:  Allergies  Allergen Reactions  . Aspirin Shortness Of Breath, Swelling and Other (See Comments)    Pt states that Jo Frank vomits blood.   Marland Kitchen Penicillins Anaphylaxis  . Nitrofuran Derivatives Nausea And Vomiting    Current Facility-Administered Medications  Medication Dose Route Frequency Provider Last Rate Last Dose  . budesonide-formoterol (SYMBICORT) 160-4.5 MCG/ACT inhaler 2 puff  2 puff Inhalation BID Lance Coon, MD   2 puff at 09/04/14 0848  . clopidogrel (PLAVIX) tablet 75 mg  75 mg Oral Q breakfast Vaughan Basta, MD   75 mg at 09/04/14 0848  . diltiazem (CARDIZEM CD) 24 hr capsule 120 mg  120 mg Oral Daily Vishwanath Hande, MD   120 mg at 09/04/14 0930  . enalapril (VASOTEC) tablet 20 mg  20 mg Oral Daily Vaughan Basta, MD   20 mg at 09/04/14 0930  . furosemide (LASIX) tablet 40 mg  40 mg Oral Daily Vishwanath Hande, MD   40 mg at 09/04/14 0930  . guaiFENesin-codeine 100-10 MG/5ML solution 5 mL  5 mL Oral Q6H PRN Vishwanath Hande, MD   5 mL at 09/03/14 2309  . heparin injection 5,000 Units  5,000 Units Subcutaneous 3 times per day Vaughan Basta, MD   5,000 Units at 09/04/14 0514  . ipratropium-albuterol (DUONEB) 0.5-2.5 (3) MG/3ML nebulizer solution 3 mL  3 mL Nebulization Q6H Vishwanath Hande,  MD   3 mL at 09/04/14 0803  . Levofloxacin (LEVAQUIN) IVPB 250 mg  250 mg Intravenous Q24H Vishwanath Hande, MD   250 mg at 09/04/14 0952  . pantoprazole (PROTONIX) EC tablet 40 mg  40 mg Oral Daily Vaughan Basta, MD   40 mg at 09/04/14 0931  . predniSONE (DELTASONE) tablet 20 mg  20 mg Oral Q breakfast Vaughan Basta, MD   20 mg at 09/04/14 0848  . vitamin B-12 (CYANOCOBALAMIN) tablet 100 mcg  100 mcg Oral Daily Vaughan Basta, MD   100 mcg at 09/04/14 0930    OBJECTIVE: BP 103/46 mmHg  Pulse 103  Temp(Src) 97.9 F (36.6 C) (Oral)  Resp 20  Ht 5' 1"  (1.549 m)  Wt 147 lb 4.8 oz (66.815 kg)  BMI 27.85 kg/m2  SpO2 97%   Body mass index is 27.85 kg/(m^2).    ECOG FS:3 - Symptomatic, >50% confined to bed  General: Well-developed, well-nourished, no acute distress. Eyes: Pink conjunctiva, anicteric sclera. HEENT: Normocephalic, moist mucous membranes, clear oropharnyx. Lungs: Inspiratory wheezing, rhonchi. Heart: Irregular rate and rhythm, tachycardia Abdomen: Soft, nontender,  nondistended. No organomegaly noted, normoactive bowel sounds. Musculoskeletal: No edema, cyanosis, or clubbing. Neuro: Alert, answering all questions appropriately. Cranial nerves grossly intact. Skin: No rashes or petechiae noted. Psych: Normal affect. Lymphatics: No cervical, calvicular, axillary or inguinal LAD.   LAB RESULTS:  Admission on 08/31/2014  Component Date Value Ref Range Status  . WBC 08/31/2014 9.6  3.6 - 11.0 K/uL Final  . RBC 08/31/2014 3.39* 3.80 - 5.20 MIL/uL Final  . Hemoglobin 08/31/2014 9.2* 12.0 - 16.0 g/dL Final  . HCT 08/31/2014 28.7* 35.0 - 47.0 % Final  . MCV 08/31/2014 84.7  80.0 - 100.0 fL Final  . MCH 08/31/2014 27.1  26.0 - 34.0 pg Final  . MCHC 08/31/2014 32.0  32.0 - 36.0 g/dL Final  . RDW 08/31/2014 16.5* 11.5 - 14.5 % Final  . Platelets 08/31/2014 410  150 - 440 K/uL Final  . Neutrophils Relative % 08/31/2014 96   Final  . Neutro Abs 08/31/2014  9.2* 1.4 - 6.5 K/uL Final  . Lymphocytes Relative 08/31/2014 3   Final  . Lymphs Abs 08/31/2014 0.3* 1.0 - 3.6 K/uL Final  . Monocytes Relative 08/31/2014 1   Final  . Monocytes Absolute 08/31/2014 0.1* 0.2 - 0.9 K/uL Final  . Eosinophils Relative 08/31/2014 0   Final  . Eosinophils Absolute 08/31/2014 0.0  0 - 0.7 K/uL Final  . Basophils Relative 08/31/2014 0   Final  . Basophils Absolute 08/31/2014 0.0  0 - 0.1 K/uL Final  . Sodium 08/31/2014 129* 135 - 145 mmol/L Final  . Potassium 08/31/2014 3.4* 3.5 - 5.1 mmol/L Final  . Chloride 08/31/2014 94* 101 - 111 mmol/L Final  . CO2 08/31/2014 23  22 - 32 mmol/L Final  . Glucose, Bld 08/31/2014 167* 65 - 99 mg/dL Final  . BUN 08/31/2014 17  6 - 20 mg/dL Final  . Creatinine, Ser 08/31/2014 1.17* 0.44 - 1.00 mg/dL Final  . Calcium 08/31/2014 8.7* 8.9 - 10.3 mg/dL Final  . Total Protein 08/31/2014 6.1* 6.5 - 8.1 g/dL Final  . Albumin 08/31/2014 2.7* 3.5 - 5.0 g/dL Final  . AST 08/31/2014 31  15 - 41 U/L Final  . ALT 08/31/2014 26  14 - 54 U/L Final  . Alkaline Phosphatase 08/31/2014 69  38 - 126 U/L Final  . Total Bilirubin 08/31/2014 0.4  0.3 - 1.2 mg/dL Final  . GFR calc non Af Amer 08/31/2014 42* >60 mL/min Final  . GFR calc Af Amer 08/31/2014 48* >60 mL/min Final   Comment: (NOTE) The eGFR has been calculated using the CKD EPI equation. This calculation has not been validated in all clinical situations. eGFR's persistently <60 mL/min signify possible Chronic Kidney Disease.   . Anion gap 08/31/2014 12  5 - 15 Final  . Troponin I 08/31/2014 0.34* <0.031 ng/mL Final   Comment: READ BACK AND VERIFIED WITH KENDALL MOFFIT AT 4696 08/31/14.Marland KitchenMarland KitchenSMG        PERSISTENTLY INCREASED TROPONIN VALUES IN THE RANGE OF 0.04-0.49 ng/mL CAN BE SEEN IN:       -UNSTABLE ANGINA       -CONGESTIVE HEART FAILURE       -MYOCARDITIS       -CHEST TRAUMA       -ARRYHTHMIAS       -LATE PRESENTING MYOCARDIAL INFARCTION       -COPD   CLINICAL FOLLOW-UP  RECOMMENDED.   . B Natriuretic Peptide 08/31/2014 733.0* 0.0 - 100.0 pg/mL Final  . Specimen Description 08/31/2014 BLOOD LEFT ASSIST CONTROL  Final  . Special Requests 08/31/2014 BOTTLES DRAWN AEROBIC AND ANAEROBIC 3ML   Final  . Culture 08/31/2014 NO GROWTH 3 DAYS   Final  . Report Status 08/31/2014 PENDING   Incomplete  . Specimen Description 08/31/2014 BLOOD RIGHT ASSIST CONTROL   Final  . Special Requests 08/31/2014 BOTTLES DRAWN AEROBIC AND ANAEROBIC 3ML   Final  . Culture 08/31/2014 NO GROWTH 3 DAYS   Final  . Report Status 08/31/2014 PENDING   Incomplete  . Troponin I 08/31/2014 0.34* <0.031 ng/mL Final   Comment: RESULTS PREVIOUSLY CALLED BY [facility] AT 1725 08/31/14.Marland KitchenMarland KitchenSMG        PERSISTENTLY INCREASED TROPONIN VALUES IN THE RANGE OF 0.04-0.49 ng/mL CAN BE SEEN IN:       -UNSTABLE ANGINA       -CONGESTIVE HEART FAILURE       -MYOCARDITIS       -CHEST TRAUMA       -ARRYHTHMIAS       -LATE PRESENTING MYOCARDIAL INFARCTION       -COPD   CLINICAL FOLLOW-UP RECOMMENDED.   Marland Kitchen Troponin I 09/01/2014 0.34* <0.031 ng/mL Final   Comment: RESULTS PREVIOUSLY CALLED AT 1725 8.3.16 [facility]        PERSISTENTLY INCREASED TROPONIN VALUES IN THE RANGE OF 0.04-0.49 ng/mL CAN BE SEEN IN:       -UNSTABLE ANGINA       -CONGESTIVE HEART FAILURE       -MYOCARDITIS       -CHEST TRAUMA       -ARRYHTHMIAS       -LATE PRESENTING MYOCARDIAL INFARCTION       -COPD   CLINICAL FOLLOW-UP RECOMMENDED.   Marland Kitchen Sodium 09/01/2014 133* 135 - 145 mmol/L Final  . Potassium 09/01/2014 3.7  3.5 - 5.1 mmol/L Final  . Chloride 09/01/2014 95* 101 - 111 mmol/L Final  . CO2 09/01/2014 26  22 - 32 mmol/L Final  . Glucose, Bld 09/01/2014 145* 65 - 99 mg/dL Final  . BUN 09/01/2014 19  6 - 20 mg/dL Final  . Creatinine, Ser 09/01/2014 1.26* 0.44 - 1.00 mg/dL Final  . Calcium 09/01/2014 8.8* 8.9 - 10.3 mg/dL Final  . GFR calc non Af Amer 09/01/2014 38* >60 mL/min Final  . GFR calc Af Amer 09/01/2014 44* >60 mL/min Final    Comment: (NOTE) The eGFR has been calculated using the CKD EPI equation. This calculation has not been validated in all clinical situations. eGFR's persistently <60 mL/min signify possible Chronic Kidney Disease.   . Anion gap 09/01/2014 12  5 - 15 Final  . WBC 09/01/2014 7.3  3.6 - 11.0 K/uL Final  . RBC 09/01/2014 3.54* 3.80 - 5.20 MIL/uL Final  . Hemoglobin 09/01/2014 9.7* 12.0 - 16.0 g/dL Final  . HCT 09/01/2014 29.9* 35.0 - 47.0 % Final  . MCV 09/01/2014 84.6  80.0 - 100.0 fL Final  . MCH 09/01/2014 27.4  26.0 - 34.0 pg Final  . MCHC 09/01/2014 32.3  32.0 - 36.0 g/dL Final  . RDW 09/01/2014 16.5* 11.5 - 14.5 % Final  . Platelets 09/01/2014 392  150 - 440 K/uL Final  . WBC 09/02/2014 11.1* 3.6 - 11.0 K/uL Final  . RBC 09/02/2014 3.47* 3.80 - 5.20 MIL/uL Final  . Hemoglobin 09/02/2014 9.5* 12.0 - 16.0 g/dL Final  . HCT 09/02/2014 29.4* 35.0 - 47.0 % Final  . MCV 09/02/2014 84.8  80.0 - 100.0 fL Final  . MCH 09/02/2014 27.3  26.0 - 34.0 pg Final  . MCHC  09/02/2014 32.2  32.0 - 36.0 g/dL Final  . RDW 09/02/2014 16.6* 11.5 - 14.5 % Final  . Platelets 09/02/2014 369  150 - 440 K/uL Final  . Neutrophils Relative % 09/02/2014 81   Final  . Neutro Abs 09/02/2014 9.0* 1.4 - 6.5 K/uL Final  . Lymphocytes Relative 09/02/2014 10   Final  . Lymphs Abs 09/02/2014 1.1  1.0 - 3.6 K/uL Final  . Monocytes Relative 09/02/2014 9   Final  . Monocytes Absolute 09/02/2014 1.0* 0.2 - 0.9 K/uL Final  . Eosinophils Relative 09/02/2014 0   Final  . Eosinophils Absolute 09/02/2014 0.0  0 - 0.7 K/uL Final  . Basophils Relative 09/02/2014 0   Final  . Basophils Absolute 09/02/2014 0.0  0 - 0.1 K/uL Final  . Sodium 09/02/2014 137  135 - 145 mmol/L Final  . Potassium 09/02/2014 3.6  3.5 - 5.1 mmol/L Final  . Chloride 09/02/2014 96* 101 - 111 mmol/L Final  . CO2 09/02/2014 32  22 - 32 mmol/L Final  . Glucose, Bld 09/02/2014 103* 65 - 99 mg/dL Final  . BUN 09/02/2014 24* 6 - 20 mg/dL Final  . Creatinine,  Ser 09/02/2014 1.38* 0.44 - 1.00 mg/dL Final  . Calcium 09/02/2014 8.7* 8.9 - 10.3 mg/dL Final  . GFR calc non Af Amer 09/02/2014 34* >60 mL/min Final  . GFR calc Af Amer 09/02/2014 39* >60 mL/min Final   Comment: (NOTE) The eGFR has been calculated using the CKD EPI equation. This calculation has not been validated in all clinical situations. eGFR's persistently <60 mL/min signify possible Chronic Kidney Disease.   . Anion gap 09/02/2014 9  5 - 15 Final  . TSH 09/02/2014 0.738  0.350 - 4.500 uIU/mL Final  . Specimen Description 09/02/2014 EXPECTORATED SPUTUM   Final  . Special Requests 09/02/2014 NONE   Final  . Sputum evaluation 09/02/2014 THIS SPECIMEN IS ACCEPTABLE FOR SPUTUM CULTURE   Final  . Report Status 09/02/2014 09/02/2014 FINAL   Final  . Specimen Description 09/02/2014 EXPECTORATED SPUTUM   Final  . Special Requests 09/02/2014 NONE Reflexed from [id number]   Final  . Gram Stain 09/02/2014    Final                   Value:GOOD SPECIMEN - 80-90% WBCS MODERATE WBC SEEN MODERATE GRAM POSITIVE COCCI IN CLUSTERS IN PAIRS FEW GRAM NEGATIVE RODS FEW GRAM NEGATIVE COCCOBACILLI   . Culture 09/02/2014 HOLDING FOR POSSIBLE PATHOGEN   Final  . Report Status 09/02/2014 PENDING   Incomplete  . WBC 09/03/2014 11.2* 3.6 - 11.0 K/uL Final  . RBC 09/03/2014 3.48* 3.80 - 5.20 MIL/uL Final  . Hemoglobin 09/03/2014 9.4* 12.0 - 16.0 g/dL Final  . HCT 09/03/2014 29.5* 35.0 - 47.0 % Final  . MCV 09/03/2014 84.7  80.0 - 100.0 fL Final  . MCH 09/03/2014 27.1  26.0 - 34.0 pg Final  . MCHC 09/03/2014 32.0  32.0 - 36.0 g/dL Final  . RDW 09/03/2014 16.8* 11.5 - 14.5 % Final  . Platelets 09/03/2014 364  150 - 440 K/uL Final  . Neutrophils Relative % 09/03/2014 79   Final  . Neutro Abs 09/03/2014 8.8* 1.4 - 6.5 K/uL Final  . Lymphocytes Relative 09/03/2014 10   Final  . Lymphs Abs 09/03/2014 1.2  1.0 - 3.6 K/uL Final  . Monocytes Relative 09/03/2014 10   Final  . Monocytes Absolute 09/03/2014 1.1*  0.2 - 0.9 K/uL Final  . Eosinophils Relative 09/03/2014 1  Final  . Eosinophils Absolute 09/03/2014 0.1  0 - 0.7 K/uL Final  . Basophils Relative 09/03/2014 0   Final  . Basophils Absolute 09/03/2014 0.0  0 - 0.1 K/uL Final  . Sodium 09/03/2014 137  135 - 145 mmol/L Final  . Potassium 09/03/2014 3.7  3.5 - 5.1 mmol/L Final  . Chloride 09/03/2014 94* 101 - 111 mmol/L Final  . CO2 09/03/2014 35* 22 - 32 mmol/L Final  . Glucose, Bld 09/03/2014 94  65 - 99 mg/dL Final  . BUN 09/03/2014 26* 6 - 20 mg/dL Final  . Creatinine, Ser 09/03/2014 1.36* 0.44 - 1.00 mg/dL Final  . Calcium 09/03/2014 8.7* 8.9 - 10.3 mg/dL Final  . GFR calc non Af Amer 09/03/2014 35* >60 mL/min Final  . GFR calc Af Amer 09/03/2014 40* >60 mL/min Final   Comment: (NOTE) The eGFR has been calculated using the CKD EPI equation. This calculation has not been validated in all clinical situations. eGFR's persistently <60 mL/min signify possible Chronic Kidney Disease.   . Anion gap 09/03/2014 8  5 - 15 Final  . WBC 09/04/2014 10.8  3.6 - 11.0 K/uL Final  . RBC 09/04/2014 3.30* 3.80 - 5.20 MIL/uL Final  . Hemoglobin 09/04/2014 9.2* 12.0 - 16.0 g/dL Final  . HCT 09/04/2014 28.1* 35.0 - 47.0 % Final  . MCV 09/04/2014 85.2  80.0 - 100.0 fL Final  . MCH 09/04/2014 27.9  26.0 - 34.0 pg Final  . MCHC 09/04/2014 32.7  32.0 - 36.0 g/dL Final  . RDW 09/04/2014 16.4* 11.5 - 14.5 % Final  . Platelets 09/04/2014 335  150 - 440 K/uL Final  . Neutrophils Relative % 09/04/2014 81   Final  . Neutro Abs 09/04/2014 8.7* 1.4 - 6.5 K/uL Final  . Lymphocytes Relative 09/04/2014 9   Final  . Lymphs Abs 09/04/2014 1.0  1.0 - 3.6 K/uL Final  . Monocytes Relative 09/04/2014 9   Final  . Monocytes Absolute 09/04/2014 1.0* 0.2 - 0.9 K/uL Final  . Eosinophils Relative 09/04/2014 1   Final  . Eosinophils Absolute 09/04/2014 0.1  0 - 0.7 K/uL Final  . Basophils Relative 09/04/2014 0   Final  . Basophils Absolute 09/04/2014 0.0  0 - 0.1 K/uL  Final  . Sodium 09/04/2014 136  135 - 145 mmol/L Final  . Potassium 09/04/2014 3.8  3.5 - 5.1 mmol/L Final  . Chloride 09/04/2014 92* 101 - 111 mmol/L Final  . CO2 09/04/2014 35* 22 - 32 mmol/L Final  . Glucose, Bld 09/04/2014 108* 65 - 99 mg/dL Final  . BUN 09/04/2014 29* 6 - 20 mg/dL Final  . Creatinine, Ser 09/04/2014 1.25* 0.44 - 1.00 mg/dL Final  . Calcium 09/04/2014 8.6* 8.9 - 10.3 mg/dL Final  . GFR calc non Af Amer 09/04/2014 38* >60 mL/min Final  . GFR calc Af Amer 09/04/2014 44* >60 mL/min Final   Comment: (NOTE) The eGFR has been calculated using the CKD EPI equation. This calculation has not been validated in all clinical situations. eGFR's persistently <60 mL/min signify possible Chronic Kidney Disease.   . Anion gap 09/04/2014 9  5 - 15 Final    STUDIES: Dg Chest 1 View  09/01/2014   CLINICAL DATA:  Acute onset of dyspnea.  Initial encounter.  EXAM: CHEST  1 VIEW  COMPARISON:  Chest radiograph performed 08/31/2014  FINDINGS: Bilateral central airspace opacification is perhaps slightly worsened from the prior study and concerning for pulmonary edema. Small bilateral pleural effusions are noted. No  pneumothorax is seen.  The cardiomediastinal silhouette is borderline enlarged. No acute osseous abnormalities are identified.  IMPRESSION: Slightly worsening pulmonary edema noted, with small bilateral pleural effusions. Borderline cardiomegaly.   Electronically Signed   By: Garald Balding M.D.   On: 09/01/2014 01:33   Dg Chest Port 1 View  09/01/2014   CLINICAL DATA:  History of hypertension stroke COPD gastroesophageal reflux and breast cancer, recently diagnosed with metastatic lung cancer involving the liver for the past 2-3 weeks with progressive worsening shortness of breath over that time  EXAM: PORTABLE CHEST - 1 VIEW  COMPARISON:  09/01/2014 at 1:18  FINDINGS: Stable mild cardiac enlargement with central vascular congestion. Extensive bilateral perihilar hazy opacities. Small  pleural effusions. Interstitial prominence.  IMPRESSION: Congestive heart failure with moderately severe pulmonary edema stable from prior study   Electronically Signed   By: Skipper Cliche M.D.   On: 09/01/2014 08:27   Dg Chest Portable 1 View  08/31/2014   CLINICAL DATA:  Progressive shortness of breath. History of lung cancer. Liver cancer. Breast cancer. COPD.  EXAM: PORTABLE CHEST - 1 VIEW  COMPARISON:  CT of the chest 08/18/2014.  FINDINGS: The heart is enlarged. Mild edema is present. Bilateral effusions are noted. Bibasilar airspace disease is evident. Nodular disease in the right upper lobe is partially obscured by edema.  IMPRESSION: 1. Cardiomegaly with mild edema and bilateral effusions compatible with congestive heart failure. 2. Right upper lobe nodule.   Electronically Signed   By: San Morelle M.D.   On: 08/31/2014 17:14    ASSESSMENT:  History of stage I breast cancer, September 2013. Now with multiple lung and liver nodules, biopsy of liver lesions most likely consistent with a lung cancer as primary. Stage IV lung cancer, with metastases to liver  PLAN:   1. Lung CA. Patient has pending tissue specific testing. Patient appears to be improving overall as shortness of breath is improving. Jo Frank was recently seen in the office on August 3 by Dr. Oliva Bustard who discussed diagnosis, prognosis, and plans for treatment with chemotherapy. 2. Atrial fibrillation. Patient's heart rate approximately 100, overall improved with use of daily oral Cardizem for control.  Patient's acute shortness of breath is improved today. Pulmonary edema and bilateral pleural effusions are improved. Patient is currently on steroids, oxygen, and duonebs. Also continues with IV Levaquin. Patient's social situation is strenuous as her husband is currently under hospice care due to end-stage Parkinson's. Her granddaughter is at the bedside now and states Jo Frank feels her grandmother will need some assistance in the  home for herself. Discharge would advise [facility] with [facility] if possible for physical therapy as well as nursing care.  We will continue to follow throughout hospital stay.  Patient expressed understanding and was in agreement with this plan. Jo Frank also understands that Jo Frank can call clinic at any time with any questions, concerns, or complaints.   Dr. Oliva Bustard was available for consultation and review of plan of care for this patient.  Breast CA   Staging form: Breast, AJCC 7th Edition     Clinical: Stage IA (T1c, N0, M0) - Unsigned  Stage for lung CA with metastases to liver   Evlyn Kanner, NP   09/04/2014 12:13 PM

## 2014-09-05 DIAGNOSIS — Z923 Personal history of irradiation: Secondary | ICD-10-CM

## 2014-09-05 DIAGNOSIS — J449 Chronic obstructive pulmonary disease, unspecified: Secondary | ICD-10-CM

## 2014-09-05 DIAGNOSIS — Z87442 Personal history of urinary calculi: Secondary | ICD-10-CM

## 2014-09-05 DIAGNOSIS — R0602 Shortness of breath: Secondary | ICD-10-CM

## 2014-09-05 DIAGNOSIS — C3490 Malignant neoplasm of unspecified part of unspecified bronchus or lung: Secondary | ICD-10-CM

## 2014-09-05 DIAGNOSIS — I4891 Unspecified atrial fibrillation: Secondary | ICD-10-CM

## 2014-09-05 DIAGNOSIS — Z79899 Other long term (current) drug therapy: Secondary | ICD-10-CM

## 2014-09-05 DIAGNOSIS — C771 Secondary and unspecified malignant neoplasm of intrathoracic lymph nodes: Secondary | ICD-10-CM

## 2014-09-05 DIAGNOSIS — R5383 Other fatigue: Secondary | ICD-10-CM

## 2014-09-05 DIAGNOSIS — K219 Gastro-esophageal reflux disease without esophagitis: Secondary | ICD-10-CM

## 2014-09-05 DIAGNOSIS — Z87891 Personal history of nicotine dependence: Secondary | ICD-10-CM

## 2014-09-05 DIAGNOSIS — C787 Secondary malignant neoplasm of liver and intrahepatic bile duct: Secondary | ICD-10-CM

## 2014-09-05 DIAGNOSIS — R160 Hepatomegaly, not elsewhere classified: Secondary | ICD-10-CM

## 2014-09-05 DIAGNOSIS — R05 Cough: Secondary | ICD-10-CM

## 2014-09-05 DIAGNOSIS — R531 Weakness: Secondary | ICD-10-CM

## 2014-09-05 DIAGNOSIS — Z853 Personal history of malignant neoplasm of breast: Secondary | ICD-10-CM

## 2014-09-05 DIAGNOSIS — Z8673 Personal history of transient ischemic attack (TIA), and cerebral infarction without residual deficits: Secondary | ICD-10-CM

## 2014-09-05 DIAGNOSIS — J9600 Acute respiratory failure, unspecified whether with hypoxia or hypercapnia: Secondary | ICD-10-CM

## 2014-09-05 LAB — CULTURE, RESPIRATORY

## 2014-09-05 LAB — BASIC METABOLIC PANEL
Anion gap: 9 (ref 5–15)
BUN: 34 mg/dL — ABNORMAL HIGH (ref 6–20)
CHLORIDE: 94 mmol/L — AB (ref 101–111)
CO2: 35 mmol/L — ABNORMAL HIGH (ref 22–32)
Calcium: 8.9 mg/dL (ref 8.9–10.3)
Creatinine, Ser: 1.18 mg/dL — ABNORMAL HIGH (ref 0.44–1.00)
GFR calc Af Amer: 48 mL/min — ABNORMAL LOW (ref >60)
GFR calc non Af Amer: 41 mL/min — ABNORMAL LOW (ref >60)
Glucose, Bld: 109 mg/dL — ABNORMAL HIGH (ref 65–99)
POTASSIUM: 4.2 mmol/L (ref 3.5–5.1)
Sodium: 138 mmol/L (ref 135–145)

## 2014-09-05 LAB — CULTURE, BLOOD (ROUTINE X 2)
CULTURE: NO GROWTH
Culture: NO GROWTH

## 2014-09-05 LAB — CBC WITH DIFFERENTIAL/PLATELET
BASOS PCT: 0 %
Basophils Absolute: 0 10*3/uL (ref 0–0.1)
EOS ABS: 0.1 10*3/uL (ref 0–0.7)
Eosinophils Relative: 1 %
HEMATOCRIT: 28.6 % — AB (ref 35.0–47.0)
Hemoglobin: 9.2 g/dL — ABNORMAL LOW (ref 12.0–16.0)
Lymphocytes Relative: 9 %
Lymphs Abs: 1 10*3/uL (ref 1.0–3.6)
MCH: 27.7 pg (ref 26.0–34.0)
MCHC: 32.4 g/dL (ref 32.0–36.0)
MCV: 85.7 fL (ref 80.0–100.0)
MONO ABS: 0.8 10*3/uL (ref 0.2–0.9)
Monocytes Relative: 7 %
NEUTROS ABS: 8.9 10*3/uL — AB (ref 1.4–6.5)
Neutrophils Relative %: 83 %
Platelets: 310 10*3/uL (ref 150–440)
RBC: 3.33 MIL/uL — ABNORMAL LOW (ref 3.80–5.20)
RDW: 17 % — ABNORMAL HIGH (ref 11.5–14.5)
WBC: 10.9 10*3/uL (ref 3.6–11.0)

## 2014-09-05 LAB — CULTURE, RESPIRATORY W GRAM STAIN: Culture: NORMAL

## 2014-09-05 MED ORDER — LEVOFLOXACIN 250 MG PO TABS
250.0000 mg | ORAL_TABLET | Freq: Every day | ORAL | Status: DC
  Administered 2014-09-05: 250 mg via ORAL
  Filled 2014-09-05: qty 1

## 2014-09-05 NOTE — Clinical Documentation Improvement (Signed)
Supporting Information: Acute CHF exacerbation per 8/03 progress notes. Acute SOB secondary to CHF, continue IV lasix and oxygen per 8/04 progress notes.  8/04: CXR: CHF with moderately severe pulmonary edema.l   Possible Clinical Condition: . Document acuity --Acute --Chronic --Acute on Chronic . Document type --Diastolic --Systolic --Combined systolic and diastolic . Due to or associated with --Cardiac or other surgery --Hypertension --Valvular disease --Rheumatic heart disease Endocarditis (valvitis) Pericarditis Myocarditis --Other (specify)     Thank Sherian Maroon Documentation Specialist [phone] [email or web address]

## 2014-09-05 NOTE — Clinical Social Work Placement (Signed)
   CLINICAL SOCIAL WORK PLACEMENT  NOTE  Date:  09/05/2014  Patient Details  Name: Jo Frank MRN: [id number] Date of Birth: [birth year]  Clinical Social Work is seeking post-discharge placement for this patient at the [place] level of care (*CSW will initial, date and re-position this form in  chart as items are completed):  Yes   Patient/family provided with [place] Work Department's list of facilities offering this level of care within the geographic area requested by the patient (or if unable, by the patient's family).  Yes   Patient/family informed of their freedom to choose among providers that offer the needed level of care, that participate in Medicare, Medicaid or managed care program needed by the patient, have an available bed and are willing to accept the patient.  Yes   Patient/family informed of [facility]'s ownership interest in [facility] and [facility], as well as of the fact that they are under no obligation to receive care at these facilities.  PASRR submitted to EDS on 09/05/14     PASRR number received on 09/05/14     Existing PASRR number confirmed on       FL2 transmitted to all facilities in geographic area requested by pt/family on 09/05/14     FL2 transmitted to all facilities within larger geographic area on       Patient informed that his/her managed care company has contracts with or will negotiate with certain facilities, including the following:   (SNF packet was given with this information on it)     Yes   Patient/family informed of bed offers received.  Patient chooses bed at  [facility])     Physician recommends and patient chooses bed at      Patient to be transferred to  [facility]) on 09/06/14.  Patient to be transferred to facility by  (EMS)     Patient family notified on 09/05/14 of transfer.  Name of family member notified:  Debria Garret Hubbard        Additional Comment:    _______________________________________________ Mathews Argyle, LCSW 09/05/2014, 3:15 PM

## 2014-09-05 NOTE — Clinical Social Work Note (Signed)
Clinical Social Work Assessment  Patient Details  Name: Jo Frank MRN: [id number] Date of Birth: January 19, 1929  Date of referral:  09/05/14               Reason for consult:  Facility Placement                Permission sought to share information with:  Family Supports, Customer service manager Permission granted to share information::  Yes, Verbal Permission Granted  Name::     Jo Frank daughter  Agency::  [facility] facilities  Relationship::  Daughter and granddaughter  Sport and exercise psychologist Information:  granddaughter- [phone]  Housing/Transportation Living arrangements for the past 2 months:  Single Family Home Source of Information:  Patient Patient Interpreter Needed:  None Criminal Activity/Legal Involvement Pertinent to Current Situation/Hospitalization:  No - Comment as needed Significant Relationships:  Adult Children, Spouse, Other(Comment) (husband is currently in hospice home, and had parkinsons.) Lives with:  Spouse Do you feel safe going back to the place where you live?  No Need for family participation in patient care:  Yes (Comment)  Care giving concerns:  Current concerns are coordinating her chemo therapy with SNF placement.    Social Worker assessment / plan:  CSW spoke to pt.  She was sitting up in bed watching TV.  Alert and Ox4.  She stated that she lived with her husband but he recently went to hospice for Parkinsons.  She was the main care giver in the home.  Her daughter does help some but her daughter also needs to work.  CSW contacted Oncologist to ask about chemo schedule, he stated that he would be willing to work with her if she wanted to go to SNF.  CSW will speak to facilities to coordinate Chemo schedule for pt.    Employment status:  Disabled (Comment on whether or not currently receiving Disability), Retired Forensic scientist:  Medicare PT Recommendations:  [place] / Referral to community resources:  [place]  Patient/Family's Response to care:  Pt was in favor of DC to SNF  Patient/Family's Understanding of and Emotional Response to Diagnosis, Current Treatment, and Prognosis:  Pt was in favor of DC to SNF and verbalized her understanding of this DC plan.   Emotional Assessment Appearance:  Appears stated age Attitude/Demeanor/Rapport:   (pleasent and polite) Affect (typically observed):  Appropriate Orientation:  Oriented to Self, Oriented to Place, Oriented to  Time, Oriented to Situation Alcohol / Substance use:    Psych involvement (Current and /or in the community):  No (Comment)  Discharge Needs  Concerns to be addressed:  Care Coordination Readmission within the last 30 days:  No Current discharge risk:  Physical Impairment, Other (pt needs chemo therapy) Barriers to Discharge:  Other (CSW needs to coordinate chemo schedule with facility.  )   Mathews Argyle, LCSW 09/05/2014, 9:58 AM

## 2014-09-05 NOTE — Progress Notes (Signed)
MEDICATION RELATED CONSULT NOTE - IV to PO  Indication: Criteria met for IV to PO change per policy  Allergies  Allergen Reactions  . Aspirin Shortness Of Breath, Swelling and Other (See Comments)    Pt states that she vomits blood.   Marland Kitchen Penicillins Anaphylaxis  . Nitrofuran Derivatives Nausea And Vomiting    Patient Measurements: Height: 5' 1"  (154.9 cm) Weight: 148 lb 6.4 oz (67.314 kg) IBW/kg (Calculated) : 47.8   Vital Signs: Temp: 97.9 F (36.6 C) (08/08 0450) Temp Source: Oral (08/08 0450) BP: 125/54 mmHg (08/08 0450) Pulse Rate: 94 (08/08 0450) Intake/Output from previous day: 08/07 0701 - 08/08 0700 In: 720 [P.O.:720] Out: 1300 [Urine:1300] Intake/Output from this shift:    Labs:  Recent Labs  09/03/14 0511 09/04/14 0443 09/05/14 0440  WBC 11.2* 10.8 10.9  HGB 9.4* 9.2* 9.2*  HCT 29.5* 28.1* 28.6*  PLT 364 335 310  CREATININE 1.36* 1.25* 1.18*   Estimated Creatinine Clearance: 31.2 mL/min (by C-G formula based on Cr of 1.18).   Microbiology: Recent Results (from the past 720 hour(s))  Blood culture (routine x 2)     Status: None   Collection Time: 08/31/14  4:52 PM  Result Value Ref Range Status   Specimen Description BLOOD LEFT ASSIST CONTROL  Final   Special Requests BOTTLES DRAWN AEROBIC AND ANAEROBIC 3ML  Final   Culture NO GROWTH 5 DAYS  Final   Report Status 09/05/2014 FINAL  Final  Blood culture (routine x 2)     Status: None   Collection Time: 08/31/14  4:52 PM  Result Value Ref Range Status   Specimen Description BLOOD RIGHT ASSIST CONTROL  Final   Special Requests BOTTLES DRAWN AEROBIC AND ANAEROBIC 3ML  Final   Culture NO GROWTH 5 DAYS  Final   Report Status 09/05/2014 FINAL  Final  Culture, expectorated sputum-assessment     Status: None   Collection Time: 09/02/14  3:23 PM  Result Value Ref Range Status   Specimen Description EXPECTORATED SPUTUM  Final   Special Requests NONE  Final   Sputum evaluation THIS SPECIMEN IS ACCEPTABLE  FOR SPUTUM CULTURE  Final   Report Status 09/02/2014 FINAL  Final  Culture, respiratory (NON-Expectorated)     Status: None (Preliminary result)   Collection Time: 09/02/14  3:23 PM  Result Value Ref Range Status   Specimen Description EXPECTORATED SPUTUM  Final   Special Requests NONE Reflexed from [id number]  Final   Gram Stain   Final    GOOD SPECIMEN - 80-90% WBCS MODERATE WBC SEEN MODERATE GRAM POSITIVE COCCI IN CLUSTERS IN PAIRS FEW GRAM NEGATIVE RODS FEW GRAM NEGATIVE COCCOBACILLI    Culture HOLDING FOR POSSIBLE PATHOGEN  Final   Report Status PENDING  Incomplete    Medical History: Past Medical History  Diagnosis Date  . Hypertension   . CVA (cerebral infarction) 2010    Was on Plavix for 2 years, then switched to clopidogrel but d/c due to side effects ("sick")  . Nephrolithiasis     per patient, passes on her own  . Stroke   . COPD (chronic obstructive pulmonary disease)   . GERD (gastroesophageal reflux disease)   . Breast cancer 2013    breast cancer right-radiation-lumpectomy  . Lung cancer     Medications:  Scheduled:  . budesonide-formoterol  2 puff Inhalation BID  . clopidogrel  75 mg Oral Q breakfast  . diltiazem  120 mg Oral Daily  . enalapril  20 mg Oral Daily  .  furosemide  40 mg Oral Daily  . heparin  5,000 Units Subcutaneous 3 times per day  . ipratropium-albuterol  3 mL Nebulization Q6H  . levofloxacin (LEVAQUIN) IV  250 mg Intravenous Q24H  . pantoprazole  40 mg Oral Daily  . predniSONE  20 mg Oral Q breakfast  . vitamin B-12  100 mcg Oral Daily    Assessment: Patient is an 81 yo female receiving Levaquin 250 mg IV q24h for SOB/cough.  Patient mets the following criteria for IV to PO transition:   1. Patient has White Blood Count < 11.1 x 103/mm3 2. Patient has maintained temperature < 99.5 Fahrenheit for twenty-four hours.  3. Patient has eaten > 50% of meals for twenty-four hours. 4. Patient is taking other medications by mouth.   Plan:   Will transition patient to Levaquin 250 mg PO daily based on above criteria.   Hye Trawick G 09/05/2014,7:53 AM

## 2014-09-05 NOTE — Consult Note (Signed)
[place]  Telephone:([phone]) [phone]  Fax:([phone]) [phone]     HOLY BATTENFIELD DOB: [birth year]  MR#: [id number]  XBD#:[id number]  Patient Care Team: [name], MD as PCP - General (Internal Medicine)  CHIEF COMPLAINT:  Chief Complaint  Patient presents with  . Shortness of Breath   Patient with recent newly diagnosed adenocarcinoma from a liver mass. Most likely lung cancer versus possibility of cholangiocarcinoma. Patient with significant past medical history of COPD as well as breast cancer, triple negative disease from September 2013.  INTERVAL HISTORY:  Ms. Beeck was admitted with progressive shortness of breath, cough, and weakness. She was initially evaluated by Dr. Oliva Bustard on 08/31/2014 for metastatic adenocarcinoma of the lung with metastases to the liver. These results have been discussed as an outpatient with patient and family. She reports feeling greatly improved today but remains weak and with shortness of breath on exertion. She does report she is able to move around in the bed without shortness of breath, family also notices she is resting more comfortably. September 05, 2014 Patient was admitted in the hospital with acute respiratory failure.  Patient has carcinoma of lung metastases to mediastinal lymph node and liver consistent with poorly differentiated carcinoma.  So far all the markers are negative for EGFR and I will.  Further report is pending.  Since admissions Patient condition has improved.   REVIEW OF SYSTEMS:   Review of Systems  Constitutional: Positive for malaise/fatigue. Negative for fever, chills, weight loss and diaphoresis.  HENT: Negative for congestion, ear discharge, ear pain, hearing loss, nosebleeds, sore throat and tinnitus.   Eyes: Negative for blurred vision, double vision, photophobia, pain, discharge and redness.  Respiratory: Positive for cough, sputum production, shortness of breath and wheezing. Negative for hemoptysis and  stridor.   Cardiovascular: Positive for orthopnea. Negative for chest pain, palpitations, claudication, leg swelling and PND.  Gastrointestinal: Negative for heartburn, nausea, vomiting, abdominal pain, diarrhea, constipation, blood in stool and melena.  Genitourinary: Negative.   Musculoskeletal: Negative.   Skin: Negative.   Neurological: Positive for weakness. Negative for dizziness, tingling, focal weakness, seizures and headaches.  Endo/Heme/Allergies: Does not bruise/bleed easily.  Psychiatric/Behavioral: Negative for depression. The patient is not nervous/anxious and does not have insomnia.     As per HPI. Otherwise, a complete review of systems is negatve.  ONCOLOGY HISTORY: Oncology History   1.  Diagnosis of right breast cancer T1 cN0 M0 tumor triple negative disease status post partial mastectomy in September 2013.  Patient underwent lumpectomy radiation therapy there is no record of evaluation by oncologist.  2pT1c pN0 (sn) cM0 triple negative grade 2 invasive carcinoma of the right breast lumpectomy and sentinel node study on 10/11/2011. Tumor size 1.8 cm, grade 2, margins uninvolved by invasive carcinoma.  DCIS present, closest margin is 5 mm. 2 sentinel lymph nodes negative. ER negative (0%), PR negative (0%), HER-2/neu FISH negative (HER2/CEP17 ratio 1.16). 3.  CT scan of the chest revealed multiple lung nodules and the one nodule in July of . 4.  Liver biopsy is consistent with adenocarcinoma most likely lung cancer versus cholangiocarcinoma breast cancer has been ruled out (July, 2016)     Breast CA   10/09/2011 Initial Diagnosis Breast CA stage IC triple negative disease status post lumpectomy and radiation therapy    PAST MEDICAL HISTORY: Past Medical History  Diagnosis Date  . Hypertension   . CVA (cerebral infarction) 2010    Was on Plavix for 2 years, then switched to clopidogrel  but d/c due to side effects ("sick")  . Nephrolithiasis     per patient, passes  on her own  . Stroke   . COPD (chronic obstructive pulmonary disease)   . GERD (gastroesophageal reflux disease)   . Breast cancer 2013    breast cancer right-radiation-lumpectomy  . Lung cancer     PAST SURGICAL HISTORY: Past Surgical History  Procedure Laterality Date  . Breast lumpectomy      left side  . Abdominal hysterectomy  1960s    "bleeding profusely"  . Cholecystectomy    . Hand surgery      s/p fall    FAMILY HISTORY Family History  Problem Relation Age of Onset  . Throat cancer Mother     heavy smoker & drinker, died at 79 yo (1978)  . Heart failure Father   . Hypertension Sister   . Stroke Sister     GYNECOLOGIC HISTORY:  No LMP recorded. Patient has had a hysterectomy.     ADVANCED DIRECTIVES:    HEALTH MAINTENANCE: History  Substance Use Topics  . Smoking status: Former Smoker -- 0.50 packs/day for 30 years    Types: Cigarettes    Quit date: 12/07/1978  . Smokeless tobacco: Never Used  . Alcohol Use: No     Colonoscopy:  PAP:  Bone density:  Lipid panel:  Allergies  Allergen Reactions  . Aspirin Shortness Of Breath, Swelling and Other (See Comments)    Pt states that she vomits blood.   Marland Kitchen Penicillins Anaphylaxis  . Nitrofuran Derivatives Nausea And Vomiting    Current Facility-Administered Medications  Medication Dose Route Frequency Provider Last Rate Last Dose  . budesonide-formoterol (SYMBICORT) 160-4.5 MCG/ACT inhaler 2 puff  2 puff Inhalation BID Lance Coon, MD   2 puff at 09/05/14 [phone]  . clopidogrel (PLAVIX) tablet 75 mg  75 mg Oral Q breakfast Vaughan Basta, MD   75 mg at 09/05/14 7673  . diltiazem (CARDIZEM CD) 24 hr capsule 120 mg  120 mg Oral Daily Vishwanath Hande, MD   120 mg at 09/05/14 0942  . enalapril (VASOTEC) tablet 20 mg  20 mg Oral Daily Vaughan Basta, MD   20 mg at 09/05/14 0942  . furosemide (LASIX) tablet 40 mg  40 mg Oral Daily Vishwanath Hande, MD   40 mg at 09/05/14 0942  .  guaiFENesin-codeine 100-10 MG/5ML solution 5 mL  5 mL Oral Q6H PRN Vishwanath Hande, MD   5 mL at 09/04/14 1313  . heparin injection 5,000 Units  5,000 Units Subcutaneous 3 times per day Vaughan Basta, MD   5,000 Units at 09/05/14 1326  . ipratropium-albuterol (DUONEB) 0.5-2.5 (3) MG/3ML nebulizer solution 3 mL  3 mL Nebulization Q6H Vishwanath Hande, MD   3 mL at 09/05/14 0834  . levofloxacin (LEVAQUIN) tablet 250 mg  250 mg Oral Daily Vishwanath Hande, MD   250 mg at 09/05/14 0942  . pantoprazole (PROTONIX) EC tablet 40 mg  40 mg Oral Daily Vaughan Basta, MD   40 mg at 09/05/14 0942  . predniSONE (DELTASONE) tablet 20 mg  20 mg Oral Q breakfast Vaughan Basta, MD   20 mg at 09/05/14 4193  . vitamin B-12 (CYANOCOBALAMIN) tablet 100 mcg  100 mcg Oral Daily Vaughan Basta, MD   100 mcg at 09/05/14 0942    OBJECTIVE: BP 109/61 mmHg  Pulse 99  Temp(Src) 98.1 F (36.7 C) (Oral)  Resp 18  Ht 5' 1"  (1.549 m)  Wt 148 lb 6.4 oz (67.314 kg)  BMI 28.05 kg/m2  SpO2 97%   Body mass index is 28.05 kg/(m^2).    ECOG FS:3 - Symptomatic, >50% confined to bed  General: Well-developed, well-nourished, no acute distress. Eyes: Pink conjunctiva, anicteric sclera. HEENT: Normocephalic, moist mucous membranes, clear oropharnyx. Lungs: Inspiratory wheezing, rhonchi. Heart: Irregular rate and rhythm, tachycardia Abdomen: Soft, nontender, nondistended. No organomegaly noted, normoactive bowel sounds. Musculoskeletal: No edema, cyanosis, or clubbing. Neuro: Alert, answering all questions appropriately. Cranial nerves grossly intact. Skin: No rashes or petechiae noted. Psych: Normal affect. Lymphatics: No cervical, calvicular, axillary or inguinal LAD.   LAB RESULTS:  Admission on 08/31/2014  No results displayed because visit has over 200 results.      STUDIES: No results found.  ASSESSMENT:  History of stage I breast cancer, September 2013. Now with multiple lung and  liver nodules, biopsy of liver lesions most likely consistent with a lung cancer as primary. Stage IV lung cancer, with metastases to liver  PLAN:   1. Lung CA. Patient has pending tissue specific testing. Patient appears to be improving overall as shortness of breath is improving. She was recently seen in the office on August 3 by Dr. Oliva Bustard who discussed diagnosis, prognosis, and plans for treatment with chemotherapy. 2. Atrial fibrillation. Patient's heart rate approximately 100, overall improved with use of daily oral Cardizem for control.  All lab data CT scan has been reviewed independently Possibility of chemotherapy either inpatient or outpatient was discussed with the patient patient to make some decision by tomorrow Physiotherapy notes are available and also discussed situation with social service.  According to them patient cannot be taken care at home because of poor home situation and patient needs a rehabilitation facility placement Possibility of chemotherapy prior to discharge can be considered. All the side effects of chemotherapy including myelosuppression, alopecia, nausea vomiting fatigue weakness.  Secondary infection, and   peripheral neuropathy .  Has been discussed in details. Informal consent has been obtained and will be documented by nurses in the chart Intent of chemotherapy is palliation and relief in symptoms and extending survival  Patient expressed understanding and was in agreement with this plan. She also understands that She can call clinic at any time with any questions, concerns, or complaints.     Breast CA   Staging form: Breast, AJCC 7th Edition     Clinical: Stage IA (T1c, N0, M0) - Unsigned  Stage for lung CA with metastases to liver   Forest Gleason, MD   09/05/2014 4:28 PM

## 2014-09-05 NOTE — Progress Notes (Signed)
Pt refused Bipap. Pt doesn't feel like she needs to wear the Bipap

## 2014-09-05 NOTE — Progress Notes (Signed)
PROGRESS NOTE  KANDISE RIEHLE PIR:[id number] DOB: [birth year] DOA: 08/31/2014 PCP: [name], MD  Subjective:  81 y/o f  With hx of HTN, CVA, COPD, Recent diagnosis of Ca lung with mets admitted with progressive shortness of breath, and cough. No improvement with out pt treatment of tapering Prednisone and antibiotics. This am feels much improved. Cough is lesss    Objective: BP 125/54 mmHg  Pulse 94  Temp(Src) 97.9 F (36.6 C) (Oral)  Resp 20  Ht '5\' 1"'$  (1.549 m)  Wt 67.314 kg (148 lb 6.4 oz)  BMI 28.05 kg/m2  SpO2 97%  Intake/Output Summary (Last 24 hours) at 09/05/14 0818 Last data filed at 09/05/14 0452  Gross per 24 hour  Intake    720 ml  Output   1300 ml  Net   -580 ml   Filed Weights   09/03/14 0507 09/04/14 0414 09/05/14 0450  Weight: 68.32 kg (150 lb 9.9 oz) 66.815 kg (147 lb 4.8 oz) 67.314 kg (148 lb 6.4 oz)    Exam: HEENT: [place], AT  General: Not in distress  Cardiovascular: S1 S2  Respiratory: Scattered xxpiratory wheezes +  Abdomen: Soft. Non tender  Neuro:Non Focal  Extremities; 1 + edema   Data Reviewed: Basic Metabolic Panel:  Recent Labs Lab 09/01/14 0257 09/02/14 0402 09/03/14 0511 09/04/14 0443 09/05/14 0440  NA 133* 137 137 136 138  K 3.7 3.6 3.7 3.8 4.2  CL 95* 96* 94* 92* 94*  CO2 26 32 35* 35* 35*  GLUCOSE 145* 103* 94 108* 109*  BUN 19 24* 26* 29* 34*  CREATININE 1.26* 1.38* 1.36* 1.25* 1.18*  CALCIUM 8.8* 8.7* 8.7* 8.6* 8.9   Liver Function Tests:  Recent Labs Lab 08/31/14 1651  AST 31  ALT 26  ALKPHOS 69  BILITOT 0.4  PROT 6.1*  ALBUMIN 2.7*   No results for input(s): LIPASE, AMYLASE in the last 168 hours. No results for input(s): AMMONIA in the last 168 hours. CBC:  Recent Labs Lab 08/31/14 1651 09/01/14 0257 09/02/14 0402 09/03/14 0511 09/04/14 0443 09/05/14 0440  WBC 9.6 7.3 11.1* 11.2* 10.8 10.9  NEUTROABS 9.2*  --  9.0* 8.8* 8.7* 8.9*  HGB 9.2* 9.7* 9.5* 9.4* 9.2* 9.2*  HCT 28.7* 29.9*  29.4* 29.5* 28.1* 28.6*  MCV 84.7 84.6 84.8 84.7 85.2 85.7  PLT 410 392 369 364 335 310   Cardiac Enzymes:    Recent Labs Lab 08/31/14 1651 08/31/14 1950 09/01/14 0257  TROPONINI 0.34* 0.34* 0.34*   BNP (last 3 results)  Recent Labs  08/31/14 1651  BNP 733.0*    ProBNP (last 3 results) No results for input(s): PROBNP in the last 8760 hours.  CBG: No results for input(s): GLUCAP in the last 168 hours.  Recent Results (from the past 240 hour(s))  Blood culture (routine x 2)     Status: None   Collection Time: 08/31/14  4:52 PM  Result Value Ref Range Status   Specimen Description BLOOD LEFT ASSIST CONTROL  Final   Special Requests BOTTLES DRAWN AEROBIC AND ANAEROBIC 3ML  Final   Culture NO GROWTH 5 DAYS  Final   Report Status 09/05/2014 FINAL  Final  Blood culture (routine x 2)     Status: None   Collection Time: 08/31/14  4:52 PM  Result Value Ref Range Status   Specimen Description BLOOD RIGHT ASSIST CONTROL  Final   Special Requests BOTTLES DRAWN AEROBIC AND ANAEROBIC 3ML  Final   Culture NO GROWTH 5 DAYS  Final   Report Status 09/05/2014 FINAL  Final  Culture, expectorated sputum-assessment     Status: None   Collection Time: 09/02/14  3:23 PM  Result Value Ref Range Status   Specimen Description EXPECTORATED SPUTUM  Final   Special Requests NONE  Final   Sputum evaluation THIS SPECIMEN IS ACCEPTABLE FOR SPUTUM CULTURE  Final   Report Status 09/02/2014 FINAL  Final  Culture, respiratory (NON-Expectorated)     Status: None (Preliminary result)   Collection Time: 09/02/14  3:23 PM  Result Value Ref Range Status   Specimen Description EXPECTORATED SPUTUM  Final   Special Requests NONE Reflexed from [id number]  Final   Gram Stain   Final    GOOD SPECIMEN - 80-90% WBCS MODERATE WBC SEEN MODERATE GRAM POSITIVE COCCI IN CLUSTERS IN PAIRS FEW GRAM NEGATIVE RODS FEW GRAM NEGATIVE COCCOBACILLI    Culture HOLDING FOR POSSIBLE PATHOGEN  Final   Report Status PENDING   Incomplete     Studies: No results found.  Scheduled Meds: . budesonide-formoterol  2 puff Inhalation BID  . clopidogrel  75 mg Oral Q breakfast  . diltiazem  120 mg Oral Daily  . enalapril  20 mg Oral Daily  . furosemide  40 mg Oral Daily  . heparin  5,000 Units Subcutaneous 3 times per day  . ipratropium-albuterol  3 mL Nebulization Q6H  . levofloxacin  250 mg Oral Daily  . pantoprazole  40 mg Oral Daily  . predniSONE  20 mg Oral Q breakfast  . vitamin B-12  100 mcg Oral Daily    Continuous Infusions:   Assessment/Plan: 1 Acute Shortness of breath secondary to COPD and Bronchitis On  IV Levaquin and Prednisone ECHO : EF 60-65% CXR : Pulmonary  Edema and Bilat Pleural effusions Continue SVN's, Symbicort  and O2  2 Paroxysmal A-fib: On Cardizem- Appreciate help from Cardiology 3 HTN: On Enalapril- Continue to monitor 4 Ca Lung with Liver mets: Discussed with Dr. Oliva Bustard- Pt can be d/cd  home and he will discuss with pt and family about Out Pt Chemo  5 Hx of CVA -On Plavix  6 Physical Therapy  May nee Home O2    Code Status: Full  Family Communication: Grand daughter      [name]   09/05/2014, 8:18 AM  LOS: 5 days

## 2014-09-05 NOTE — Care Management Important Message (Signed)
Important Message  Patient Details  Name: Jo Frank MRN: [id number] Date of Birth: 11-17-1928   Medicare Important Message Given:  Yes-third notification given    [name] 09/05/2014, 2:14 PM

## 2014-09-05 NOTE — Discharge Instructions (Signed)
Heart Failure Clinic appointment on September 22, 2014 at 10:00am with Darylene Price, [place]. Please call [phone] to reschedule.

## 2014-09-05 NOTE — Clinical Social Work Note (Addendum)
CSW spoke to pt and explained that we would try to give her her chemo treatment before DC to [facility].  Pt's daughter would prefer this be delayed until after rehab.  CSW will notify oncology of daughters concerns.  Josem Kaufmann has been started for insurance for SNF placement.  CSW confirmed that pt would still be able to DC to SNF if she were to receive her treatment after rehab or before rehab.  CSW will continue to follow.

## 2014-09-06 ENCOUNTER — Inpatient Hospital Stay: Payer: PPO

## 2014-09-06 ENCOUNTER — Other Ambulatory Visit: Payer: Self-pay | Admitting: Oncology

## 2014-09-06 LAB — COMPREHENSIVE METABOLIC PANEL
ALBUMIN: 2.7 g/dL — AB (ref 3.5–5.0)
ALK PHOS: 47 U/L (ref 38–126)
ALT: 16 U/L (ref 14–54)
ANION GAP: 9 (ref 5–15)
AST: 14 U/L — AB (ref 15–41)
BUN: 30 mg/dL — ABNORMAL HIGH (ref 6–20)
CALCIUM: 9.2 mg/dL (ref 8.9–10.3)
CO2: 36 mmol/L — ABNORMAL HIGH (ref 22–32)
CREATININE: 1.21 mg/dL — AB (ref 0.44–1.00)
Chloride: 92 mmol/L — ABNORMAL LOW (ref 101–111)
GFR calc Af Amer: 46 mL/min — ABNORMAL LOW (ref >60)
GFR calc non Af Amer: 40 mL/min — ABNORMAL LOW (ref >60)
GLUCOSE: 101 mg/dL — AB (ref 65–99)
Potassium: 3.9 mmol/L (ref 3.5–5.1)
Sodium: 137 mmol/L (ref 135–145)
TOTAL PROTEIN: 5.7 g/dL — AB (ref 6.5–8.1)
Total Bilirubin: 0.5 mg/dL (ref 0.3–1.2)

## 2014-09-06 LAB — CBC WITH DIFFERENTIAL/PLATELET
Basophils Absolute: 0.1 10*3/uL (ref 0–0.1)
Basophils Relative: 1 %
Eosinophils Absolute: 0.1 10*3/uL (ref 0–0.7)
Eosinophils Relative: 2 %
HCT: 30.4 % — ABNORMAL LOW (ref 35.0–47.0)
HEMOGLOBIN: 9.8 g/dL — AB (ref 12.0–16.0)
LYMPHS ABS: 0.6 10*3/uL — AB (ref 1.0–3.6)
Lymphocytes Relative: 6 %
MCH: 27.5 pg (ref 26.0–34.0)
MCHC: 32.3 g/dL (ref 32.0–36.0)
MCV: 85.2 fL (ref 80.0–100.0)
MONOS PCT: 8 %
Monocytes Absolute: 0.8 10*3/uL (ref 0.2–0.9)
Neutro Abs: 8.2 10*3/uL — ABNORMAL HIGH (ref 1.4–6.5)
Neutrophils Relative %: 83 %
Platelets: 324 10*3/uL (ref 150–440)
RBC: 3.57 MIL/uL — AB (ref 3.80–5.20)
RDW: 16.9 % — ABNORMAL HIGH (ref 11.5–14.5)
WBC: 9.8 10*3/uL (ref 3.6–11.0)

## 2014-09-06 MED ORDER — DEXTROSE 5 % IV SOLN
1.0000 g | INTRAVENOUS | Status: DC
  Administered 2014-09-06 – 2014-09-08 (×3): 1 g via INTRAVENOUS
  Filled 2014-09-06 (×4): qty 10

## 2014-09-06 MED ORDER — IPRATROPIUM-ALBUTEROL 0.5-2.5 (3) MG/3ML IN SOLN
3.0000 mL | Freq: Four times a day (QID) | RESPIRATORY_TRACT | Status: DC
  Administered 2014-09-07 – 2014-09-08 (×4): 3 mL via RESPIRATORY_TRACT
  Filled 2014-09-06 (×6): qty 3

## 2014-09-06 MED ORDER — SODIUM CHLORIDE 0.9 % IV SOLN
Freq: Once | INTRAVENOUS | Status: AC
  Administered 2014-09-07: 14:00:00 via INTRAVENOUS
  Filled 2014-09-06: qty 5

## 2014-09-06 MED ORDER — PALONOSETRON HCL INJECTION 0.25 MG/5ML
0.2500 mg | Freq: Once | INTRAVENOUS | Status: AC
  Administered 2014-09-07: 13:00:00 0.25 mg via INTRAVENOUS
  Filled 2014-09-06: qty 5

## 2014-09-06 MED ORDER — SODIUM CHLORIDE 0.9 % IV SOLN
310.0000 mg | Freq: Once | INTRAVENOUS | Status: AC
  Administered 2014-09-07: 21:00:00 310 mg via INTRAVENOUS
  Filled 2014-09-06: qty 31

## 2014-09-06 MED ORDER — SODIUM CHLORIDE 0.9 % IV SOLN
8.0000 mg | Freq: Four times a day (QID) | INTRAVENOUS | Status: DC | PRN
  Filled 2014-09-06: qty 4

## 2014-09-06 MED ORDER — FAMOTIDINE IN NACL 20-0.9 MG/50ML-% IV SOLN
20.0000 mg | Freq: Once | INTRAVENOUS | Status: AC
  Administered 2014-09-07: 20 mg via INTRAVENOUS
  Filled 2014-09-06: qty 50

## 2014-09-06 MED ORDER — SODIUM CHLORIDE 0.9 % IV SOLN: 310.0000 mg | Freq: Once | INTRAVENOUS | Status: DC

## 2014-09-06 MED ORDER — PACLITAXEL CHEMO INJECTION 300 MG/50ML
170.0000 mg | Freq: Once | INTRAVENOUS | Status: AC
  Administered 2014-09-07: 15:00:00 168 mg via INTRAVENOUS
  Filled 2014-09-06: qty 28

## 2014-09-06 MED ORDER — DIPHENHYDRAMINE HCL 50 MG/ML IJ SOLN
12.5000 mg | Freq: Once | INTRAMUSCULAR | Status: AC
  Administered 2014-09-07: 12.5 mg via INTRAVENOUS
  Filled 2014-09-06 (×2): qty 1

## 2014-09-06 NOTE — Progress Notes (Signed)
Nutrition Follow-up  DOCUMENTATION CODES:   Non-severe (moderate) malnutrition in context of acute illness/injury  INTERVENTION:   Meals and Snacks: Cater to patient preferences  NUTRITION DIAGNOSIS:   Inadequate oral intake related to  (early satiety) as evidenced by per patient/family report. Improving as pt eatin 100% of meal trays   GOAL:   Patient will meet greater than or equal to 90% of their needs    MONITOR:    (Energy Intake, Anthropometrics, Digestive system, Electrolyte/Renal profile)  REASON FOR ASSESSMENT:   Diagnosis (CHF)    ASSESSMENT:   Pt with persistent cough, SOB; noted possibility of initiation of chemo  Diet Order:  Diet 2 gram sodium Room service appropriate?: Yes; Fluid consistency:: Thin  Energy Intake: pt eating 100% of meal trays  Electrolyte and Renal Profile:  Recent Labs Lab 09/04/14 0443 09/05/14 0440 09/06/14 0425  BUN 29* 34* 30*  CREATININE 1.25* 1.18* 1.21*  NA 136 138 137  K 3.8 4.2 3.9   Glucose Profile: No results for input(s): GLUCAP in the last 72 hours.  Meds: lasix, prednisone  Skin:  Reviewed, no issues  Height:   Ht Readings from Last 1 Encounters:  08/31/14 '5\' 1"'$  (1.549 m)    Weight:   Wt Readings from Last 1 Encounters:  09/06/14 147 lb 11.2 oz (66.996 kg)   Filed Weights   09/04/14 0414 09/05/14 0450 09/06/14 0500  Weight: 147 lb 4.8 oz (66.815 kg) 148 lb 6.4 oz (67.314 kg) 147 lb 11.2 oz (66.996 kg)    BMI:  Body mass index is 27.92 kg/(m^2).  Estimated Nutritional Needs:   Kcal:  1240-1465 kcals (BEE 867, 1.3 AF, 1.1-1.3 IF) using IBW 48 kg  Protein:  53-67 g (1.1-1.4 g/kg)   Fluid:  1200-1440 mL (25-30 ml/kg)   LOW Care Level  Kerman Passey MS, RD, LDN [phone] Pager

## 2014-09-06 NOTE — Progress Notes (Signed)
   09/06/14 1320  Clinical Encounter Type  Visited With Patient  Visit Type Initial  Spiritual Encounters  Spiritual Needs Prayer  Provided pastoral presence, prayer and support to patient on unit.  [place] [phone]

## 2014-09-06 NOTE — Progress Notes (Signed)
Patient is being transfer to 1c rm 105, report given to  [employer] ,

## 2014-09-06 NOTE — Progress Notes (Signed)
PROGRESS NOTE  Jo Frank DDU:[id number] DOB: 1928/02/17 DOA: 08/31/2014 PCP: [name], MD  Subjective:  81 y/o f  With hx of HTN, CVA, COPD, Recent diagnosis of Ca lung with mets admitted with progressive shortness of breath, and cough. No improvement with out pt treatment of tapering Prednisone and antibiotics. Pt states she is coughing more.     Objective: BP 121/55 mmHg  Pulse 92  Temp(Src) 97.8 F (36.6 C) (Oral)  Resp 18  Ht '5\' 1"'$  (1.549 m)  Wt 66.996 kg (147 lb 11.2 oz)  BMI 27.92 kg/m2  SpO2 98%  Intake/Output Summary (Last 24 hours) at 09/06/14 0820 Last data filed at 09/06/14 0645  Gross per 24 hour  Intake    840 ml  Output   2800 ml  Net  -1960 ml   Filed Weights   09/04/14 0414 09/05/14 0450 09/06/14 0500  Weight: 66.815 kg (147 lb 4.8 oz) 67.314 kg (148 lb 6.4 oz) 66.996 kg (147 lb 11.2 oz)    Exam: HEENT: [place], AT  General: Not in distress  Cardiovascular: S1 S2  Respiratory: Scattered xxpiratory wheezes +  Abdomen: Soft. Non tender  Neuro:Non Focal  Extremities; 1 + edema   Data Reviewed: Basic Metabolic Panel:  Recent Labs Lab 09/02/14 0402 09/03/14 0511 09/04/14 0443 09/05/14 0440 09/06/14 0425  NA 137 137 136 138 137  K 3.6 3.7 3.8 4.2 3.9  CL 96* 94* 92* 94* 92*  CO2 32 35* 35* 35* 36*  GLUCOSE 103* 94 108* 109* 101*  BUN 24* 26* 29* 34* 30*  CREATININE 1.38* 1.36* 1.25* 1.18* 1.21*  CALCIUM 8.7* 8.7* 8.6* 8.9 9.2   Liver Function Tests:  Recent Labs Lab 08/31/14 1651 09/06/14 0425  AST 31 14*  ALT 26 16  ALKPHOS 69 47  BILITOT 0.4 0.5  PROT 6.1* 5.7*  ALBUMIN 2.7* 2.7*   No results for input(s): LIPASE, AMYLASE in the last 168 hours. No results for input(s): AMMONIA in the last 168 hours. CBC:  Recent Labs Lab 09/02/14 0402 09/03/14 0511 09/04/14 0443 09/05/14 0440 09/06/14 0425  WBC 11.1* 11.2* 10.8 10.9 9.8  NEUTROABS 9.0* 8.8* 8.7* 8.9* 8.2*  HGB 9.5* 9.4* 9.2* 9.2* 9.8*  HCT 29.4* 29.5*  28.1* 28.6* 30.4*  MCV 84.8 84.7 85.2 85.7 85.2  PLT 369 364 335 310 324   Cardiac Enzymes:    Recent Labs Lab 08/31/14 1651 08/31/14 1950 09/01/14 0257  TROPONINI 0.34* 0.34* 0.34*   BNP (last 3 results)  Recent Labs  08/31/14 1651  BNP 733.0*    ProBNP (last 3 results) No results for input(s): PROBNP in the last 8760 hours.  CBG: No results for input(s): GLUCAP in the last 168 hours.  Recent Results (from the past 240 hour(s))  Blood culture (routine x 2)     Status: None   Collection Time: 08/31/14  4:52 PM  Result Value Ref Range Status   Specimen Description BLOOD LEFT ASSIST CONTROL  Final   Special Requests BOTTLES DRAWN AEROBIC AND ANAEROBIC 3ML  Final   Culture NO GROWTH 5 DAYS  Final   Report Status 09/05/2014 FINAL  Final  Blood culture (routine x 2)     Status: None   Collection Time: 08/31/14  4:52 PM  Result Value Ref Range Status   Specimen Description BLOOD RIGHT ASSIST CONTROL  Final   Special Requests BOTTLES DRAWN AEROBIC AND ANAEROBIC 3ML  Final   Culture NO GROWTH 5 DAYS  Final   Report  Status 09/05/2014 FINAL  Final  Culture, expectorated sputum-assessment     Status: None   Collection Time: 09/02/14  3:23 PM  Result Value Ref Range Status   Specimen Description EXPECTORATED SPUTUM  Final   Special Requests NONE  Final   Sputum evaluation THIS SPECIMEN IS ACCEPTABLE FOR SPUTUM CULTURE  Final   Report Status 09/02/2014 FINAL  Final  Culture, respiratory (NON-Expectorated)     Status: None   Collection Time: 09/02/14  3:23 PM  Result Value Ref Range Status   Specimen Description EXPECTORATED SPUTUM  Final   Special Requests NONE Reflexed from [id number]  Final   Gram Stain   Final    GOOD SPECIMEN - 80-90% WBCS MODERATE WBC SEEN MODERATE GRAM POSITIVE COCCI IN CLUSTERS IN PAIRS FEW GRAM NEGATIVE RODS FEW GRAM NEGATIVE COCCOBACILLI    Culture APPEARS TO BE NORMAL FLORA  Final   Report Status 09/05/2014 FINAL  Final     Studies: No  results found.  Scheduled Meds: . budesonide-formoterol  2 puff Inhalation BID  . clopidogrel  75 mg Oral Q breakfast  . diltiazem  120 mg Oral Daily  . enalapril  20 mg Oral Daily  . furosemide  40 mg Oral Daily  . heparin  5,000 Units Subcutaneous 3 times per day  . ipratropium-albuterol  3 mL Nebulization Q6H  . levofloxacin  250 mg Oral Daily  . pantoprazole  40 mg Oral Daily  . predniSONE  20 mg Oral Q breakfast  . vitamin B-12  100 mcg Oral Daily    Continuous Infusions:   Assessment/Plan: 1 Acute Shortness of breath secondary to COPD and Bronchitis On  Levaquin and Prednisone Persistent cough with productive brown s[putum Change to IV Rocephin ECHO : EF 60-65% CXR : Pulmonary  Edema and Bilat Pleural effusions Continue SVN's, Symbicort  and O2  2 Paroxysmal A-fib: On Cardizem- Appreciate help from Cardiology 3 HTN: On Enalapril- Continue to monitor 4 Ca Lung with Liver mets: Pt is being considered for chemo - may start soon Dr. Oliva Bustard to decide after speaking with daughter 5 Hx of CVA -On Plavix  6 Physical Therapy- For Rehab     Code Status: Full  Family Communication: Grand daughter      [name]   09/06/2014, 8:20 AM  LOS: 6 days

## 2014-09-06 NOTE — Plan of Care (Signed)
Problem: Discharge Progression Outcomes Goal: Other Discharge Outcomes/Goals Outcome: Progressing Transferred from 2A.  Plan of care progress to goal for: Remains on lasix tablet for CHF/pulm edema. CXR completed. No c/o pain. Foley removed.  Tolerating diet.  Plan for possible chemo tomorrow.

## 2014-09-06 NOTE — Progress Notes (Signed)
[place] @ [facility] Telephone:(336) [phone]  Fax:(336) [place] OB: [birth year]  MR#: [id number]  HWK#:[id number]  Patient Care Team: [name], MD as PCP - General (Internal Medicine)  CHIEF COMPLAINT:  Chief Complaint  Patient presents with  . Shortness of Breath    VISIT DIAGNOSIS:     ICD-9-CM ICD-10-CM   1. COPD exacerbation 491.21 J44.1   2. Acute exacerbation of CHF (congestive heart failure) 428.0 I50.9   3. NSTEMI (non-ST elevated myocardial infarction) 410.70 I21.4   4. CHF (congestive heart failure) 428.0 I50.9 DG Chest Port 1 View     DG Chest Port 1 View     AMB referral to CHF clinic     CANCELED: DG Chest 2 View     CANCELED: DG Chest 2 View     CANCELED: DG Chest 2 View     CANCELED: DG Chest 2 View  5. Dyspnea 786.09 R06.00 DG Chest 1 View     DG Chest 1 View     CANCELED: DG Chest 1 View     CANCELED: DG Chest 1 View  6. Shortness of breath 786.05 R06.02 DG Chest [facility] 1 View     DG Chest [place] 1 View  7. Cough 786.2 R05 DG Chest 1 View     DG Chest 1 View     Oncology History   1.  Diagnosis of right breast cancer T1 cN0 M0 tumor triple negative disease status post partial mastectomy in September 2013.  Patient underwent lumpectomy radiation therapy there is no record of evaluation by oncologist.  2pT1c pN0 (sn) cM0 triple negative grade 2 invasive carcinoma of the right breast lumpectomy and sentinel node study on 10/11/2011. Tumor size 1.8 cm, grade 2, margins uninvolved by invasive carcinoma.  DCIS present, closest margin is 5 mm. 2 sentinel lymph nodes negative. ER negative (0%), PR negative (0%), HER-2/neu FISH negative (HER2/CEP17 ratio 1.16). 3.  CT scan of the chest revealed multiple lung nodules and the one nodule in July of . 4.  Liver biopsy is consistent with adenocarcinoma most likely lung cancer versus cholangiocarcinoma breast cancer has been ruled out (July, 2016)     Breast CA   10/09/2011  Initial Diagnosis Breast CA stage IC triple negative disease status post lumpectomy and radiation therapy    Oncology Flowsheet 08/25/2014 09/01/2014 09/02/2014 09/03/2014 09/04/2014 09/05/2014 09/06/2014  LORazepam (ATIVAN) IV - 0.5 mg - - - - -  methylPREDNISolone sodium succinate 125 mg/2 mL (SOLU-MEDROL) IV 125 mg - - - - - -  ondansetron (ZOFRAN) IV - - - - - - -  predniSONE (DELTASONE) PO - 20 mg 20 mg 20 mg 20 mg 20 mg 20 mg    INTERVAL HISTORY:  81 year old lady started feeling weak and tired.  Was evaluated by primary care physician initially she received a course of prednisone and antibiotic without much relief than patient was evaluated had a chest x-ray followed by CT scan.  CT scan revealed right upper lobe lung mass multiple lung nodules and a liver nodule.  Patient had a previous history of node-negative carcinoma of breast. Feeling weak and tired.  No hemoptysis no chest pain no bony pains. Patient was admitted in the hospital feeling somewhat better. Dry hacking cough. REVIEW OF STEMS:    general status: Patient is feeling weak and tired.  No change in a performance status.  No chills.  No fever.  HEENT  No evidence of stomatitis Lungs:  Dry hacking cough.  Increasing shortness of breath Patient is acutely short of breath started early this morning.  No chills.  No fever. Cardiac: No chest pain or paroxysmal nocturnal dyspnea GI: No nausea no vomiting no diarrhea no abdominal pain Poor appetite Skin: No rash Lower extremity no swelling Neurological system: No tingling.  No numbness.  No other focal signs Musculoskeletal system no bony pains  GU: No hematuria.  No dysuria. Extremely depressed.  As per HPI. Otherwise, a complete review of systems is negatve.  PAST MEDICAL HISTORY: Past Medical History  Diagnosis Date  . Hypertension   . CVA (cerebral infarction) 2010    Was on Plavix for 2 years, then switched to clopidogrel but d/c due to side effects ("sick")  .  Nephrolithiasis     per patient, passes on her own  . Stroke   . COPD (chronic obstructive pulmonary disease)   . GERD (gastroesophageal reflux disease)   . Breast cancer 2013    breast cancer right-radiation-lumpectomy  . Lung cancer     PAST SURGICAL HISTORY: Past Surgical History  Procedure Laterality Date  . Breast lumpectomy      left side  . Abdominal hysterectomy  1960s    "bleeding profusely"  . Cholecystectomy    . Hand surgery      s/p fall    FAMILY HISTORY Family History  Problem Relation Age of Onset  . Throat cancer Mother     heavy smoker & drinker, died at 79 yo (1978)  . Heart failure Father   . Hypertension Sister   . Stroke Sister          ADVANCED DIRECTIVES:  Patient does have advance healthcare directive, Patient   does not desire to make any changes  HEALTH MAINTENANCE: History  Substance Use Topics  . Smoking status: Former Smoker -- 0.50 packs/day for 30 years    Types: Cigarettes    Quit date: 12/07/1978  . Smokeless tobacco: Never Used  . Alcohol Use: No     Allergies  Allergen Reactions  . Aspirin Shortness Of Breath, Swelling and Other (See Comments)    Pt states that she vomits blood.   Marland Kitchen Penicillins Anaphylaxis  . Nitrofuran Derivatives Nausea And Vomiting    Current Facility-Administered Medications  Medication Dose Route Frequency Provider Last Rate Last Dose  . budesonide-formoterol (SYMBICORT) 160-4.5 MCG/ACT inhaler 2 puff  2 puff Inhalation BID Lance Coon, MD   2 puff at 09/06/14 [phone]  . cefTRIAXone (ROCEPHIN) 1 g in dextrose 5 % 50 mL IVPB  1 g Intravenous Q24H Vishwanath Hande, MD   1 g at 09/06/14 1001  . clopidogrel (PLAVIX) tablet 75 mg  75 mg Oral Q breakfast Vaughan Basta, MD   75 mg at 09/06/14 0824  . diltiazem (CARDIZEM CD) 24 hr capsule 120 mg  120 mg Oral Daily Vishwanath Hande, MD   120 mg at 09/06/14 1000  . enalapril (VASOTEC) tablet 20 mg  20 mg Oral Daily Vaughan Basta, MD   20 mg at  09/06/14 1000  . furosemide (LASIX) tablet 40 mg  40 mg Oral Daily Vishwanath Hande, MD   40 mg at 09/06/14 1000  . guaiFENesin-codeine 100-10 MG/5ML solution 5 mL  5 mL Oral Q6H PRN Vishwanath Hande, MD   5 mL at 09/05/14 2135  . heparin injection 5,000 Units  5,000 Units Subcutaneous 3 times per day Vaughan Basta, MD   5,000 Units at 09/06/14 1352  . ipratropium-albuterol (DUONEB) 0.5-2.5 (3)  MG/3ML nebulizer solution 3 mL  3 mL Nebulization Q6H Vishwanath Hande, MD   3 mL at 09/06/14 1437  . pantoprazole (PROTONIX) EC tablet 40 mg  40 mg Oral Daily Vaughan Basta, MD   40 mg at 09/06/14 1000  . predniSONE (DELTASONE) tablet 20 mg  20 mg Oral Q breakfast Vaughan Basta, MD   20 mg at 09/06/14 0824  . vitamin B-12 (CYANOCOBALAMIN) tablet 100 mcg  100 mcg Oral Daily Vaughan Basta, MD   100 mcg at 09/06/14 1000    OBJECTIVE: PHYSICAL EXAM:has not lost sig General  status: Performance status is good.  Patient is feeling weak and tired.  Poor appetite.  Very depressed. HEENT: No evidence of stomatitis. Sclera and conjunctivae :: No jaundice.   pale looking. Lungs: bilateral  rhonchi.  Lymphatic system: Cervical, axillary, inguinal, lymph nodes not palpable Lateral rhonchi.  Diminished air entry on both sides. GI: Abdomen is soft.  No ascites.  Liver spleen not palpable.  No tenderness.  Bowel sounds are within normal limit Lower extremity: No edema Neurological system: Higher functions, cranial nerves intact no evidence of peripheral neuropathy. Skin: No rash.  No ecchymosis.Danley Danker Vitals:   09/06/14 1315  BP: 95/44  Pulse: 100  Temp: 99.3 F (37.4 C)  Resp:      Body mass index is 27.92 kg/(m^2).    ECOG FS:1 - Symptomatic but completely ambulatory  LAB RESULTS:  Admission on 08/31/2014  No results displayed because visit has over 200 results.       STUDIES: Ct Abdomen Wo Contrast  2014/09/01   CLINICAL DATA:  Acute upper abdominal pain after  hepatic biopsy.  EXAM: CT ABDOMEN WITHOUT CONTRAST  TECHNIQUE: Multidetector CT imaging of the abdomen was performed following the standard protocol without IV contrast.  COMPARISON:  CT scan of same day.  FINDINGS: Status post cholecystectomy. No pneumothorax is noted in visualized lung bases. Hypodense mass is noted in right hepatic lobe with Gel-Foam surrounding it consistent with recent percutaneous biopsy. There does appear to be a small subcapsular hematoma seen anteriorly over the right hepatic lobe. No other hemorrhage is noted. Atherosclerosis of abdominal aorta is noted without aneurysm formation. Large left renal cyst is again noted. The spleen and pancreas appear normal. Right renal cysts are stable. There is no evidence of bowel obstruction.  IMPRESSION: Right hepatic lesion is again noted which was the object of biopsy today. Gel-Foam is noted in and surrounding the lesion. This is expected finding status post biopsy. Small subcapsular hematoma is seen along the anterior portion of the right hepatic lobe.   Electronically Signed   By: Marijo Conception, M.D.   On: 2014-09-01 12:59   Dg Chest 1 View  09/06/2014   CLINICAL DATA:  Known history of lung carcinoma with shortness of Breath  EXAM: CHEST  1 VIEW  COMPARISON:  09/01/2014  FINDINGS: Persistent right upper lobe mass lesion is identified. Improved aeration is noted in the right lung base left lung remains clear. The cardiac shadow is stable. No acute bony abnormality is noted.  IMPRESSION: Stable right upper lobe mass lesion. Improved aeration in the right lung base is noted.   Electronically Signed   By: Inez Catalina M.D.   On: 09/06/2014 13:38   Dg Chest 1 View  09/01/2014   CLINICAL DATA:  Acute onset of dyspnea.  Initial encounter.  EXAM: CHEST  1 VIEW  COMPARISON:  Chest radiograph performed 08/31/2014  FINDINGS: Bilateral central airspace opacification  is perhaps slightly worsened from the prior study and concerning for pulmonary edema.  Small bilateral pleural effusions are noted. No pneumothorax is seen.  The cardiomediastinal silhouette is borderline enlarged. No acute osseous abnormalities are identified.  IMPRESSION: Slightly worsening pulmonary edema noted, with small bilateral pleural effusions. Borderline cardiomegaly.   Electronically Signed   By: Garald Balding M.D.   On: 09/01/2014 01:33   Ct Angio Chest Pe W/cm &/or Wo Cm  08/18/2014   CLINICAL DATA:  LEFT chest pain with cough starting this morning, history of lung cancer, RIGHT breast cancer, hypertension, stroke, former smoker  EXAM: CT ANGIOGRAPHY CHEST WITH CONTRAST  TECHNIQUE: Multidetector CT imaging of the chest was performed using the standard protocol during bolus administration of intravenous contrast. Multiplanar CT image reconstructions and MIPs were obtained to evaluate the vascular anatomy.  CONTRAST:  32m OMNIPAQUE IOHEXOL 350 MG/ML SOLN IV  COMPARISON:  07/29/2014  FINDINGS: Scattered atherosclerotic calcifications aorta, proximal great vessels, and coronary arteries.  No aortic aneurysm or dissection.  Pulmonary arteries well opacified with minimal dilatation of central pulmonary arteries.  Pulmonary arteries patent without evidence of pulmonary embolism.  Large cyst upper pole LEFT kidney 11.3 x 9.8 cm.  Multiple small RIGHT renal cyst.  Moderate-sized hiatal hernia.  Vague area of low attenuation lateral RIGHT lobe liver 3.3 cm greatest size corresponding to liver lesion which was previously biopsied.  Remaining visualized portions of upper abdomen unremarkable.  Enlarged RIGHT paratracheal lymph node 14 mm short axis image 35 previously 12 mm.  Enlarged precarinal lymph node 13 mm short axis image 47 unchanged.  Adenopathy at superior aspect of RIGHT pulmonary hilum unchanged, 14 mm short axis image 47.  RIGHT upper lobe mass anteriorly compatible with neoplasm, appears larger than on previous exam though this could be overestimated due to adjacent atelectasis,  currently measuring 3.6 x 2.9 cm previously 3.0 x 2.9 cm.  Focus of infiltrate versus non solid opacity in RIGHT upper lobe more inferolaterally appears stable.  Additional stellate semi solid opacity in RIGHT lower lobe 2.5 cm diameter image 62 little changed.  Patchy sub solid, solid, and nodular infiltrates in RIGHT lower lobe again identified.  Subtle areas of non solid opacity in the LEFT lung are also again identified, grossly similar to previous exam no additional discrete mass or nodule identified.  Tiny RIGHT pleural effusion.  No pneumothorax.  Thoracolumbar dextro convex scoliosis with rotatory component.  Osseous demineralization.  Review of the MIP images confirms the above findings.  IMPRESSION: No evidence of pulmonary embolism.  Mild dilatation of the central pulmonary arteries raising question of pulmonary arterial hypertension.  RIGHT upper lobe mass compatible with neoplasm question minimally larger than on previous exam versus overestimation due to adjacent atelectasis which appears increased.  2.5 cm diameter stellate semi solid opacity RIGHT lower lobe question additional tumor.  Multiple additional solid, semi solid and non solid opacities in both lungs may represent inflammatory process though additional tumor not completely excluded, recommend attention on follow-up exams ; some with the more nodular appearing opacities in the RIGHT lower lobe have decreased in size since the previous exam.  Renal cysts including an 11.3 cm cyst at the upper pole of the LEFT kidney.  Small hiatal hernia.   Electronically Signed   By: MLavonia DanaM.D.   On: 08/18/2014 17:26   Nm Pet Image Initial (pi) Skull Base To Thigh  08/15/2014   CLINICAL DATA:  Subsequent treatment strategy for liver lesion biopsied on 08/12/2014.  History of right breast cancer in 2013 with last radiation therapy in 2014.  EXAM: NUCLEAR MEDICINE PET SKULL BASE TO THIGH  TECHNIQUE: 12.42 mCi F-18 FDG was injected intravenously.  Full-ring PET imaging was performed from the skull base to thigh after the radiotracer. CT data was obtained and used for attenuation correction and anatomic localization.  FASTING BLOOD GLUCOSE:  Value: 79 mg/dl  COMPARISON:  Abdominal CT 08/12/2014.  Chest CT 07/29/2014.  FINDINGS: NECK  There are 2 adjacent left supraclavicular lymph nodes which are hypermetabolic. The larger of these measures 10 mm short axis on image 66 and has an SUV max of 4.1. No other hypermetabolic cervical lymph nodes demonstrated.There are no lesions of the pharyngeal mucosal space.  CHEST  The enlarged right paratracheal and right hilar lymph nodes on recent CT are hypermetabolic. The largest right paratracheal node measures 1.7 cm short axis on image 90 and has an SUV max of 10.0. No hypermetabolic subcarinal, left hilar or axillary adenopathy. The dominant spiculated right upper lobe mass is now partly obscured by atelectasis, but measures approximately 3.3 x 3.1 cm on image 84 and is hypermetabolic with an SUV max of 15.9. The other smaller ground-glass opacities in the right upper lobe (image 84) and in the superior segment of the right lower lobe (image 96) also demonstrate low-level hypermetabolic activity (SUV max 2.1). There is a 9 mm focal ground-glass opacity in the left upper lobe on image 84 which also demonstrates low-level activity. There is increased patchy airspace opacity dependently in the right lower lobe with associated low level metabolic activity, likely inflammatory.  ABDOMEN/PELVIS  The recently biopsied dominant lesion in the right hepatic lobe is hypermetabolic. This lesion measures approximately 3.5 cm on image 128 and has an SUV max of 10.6. There are at least 2 smaller adjacent hypermetabolic metastases within the right hepatic lobe. There is no suspicious activity within the spleen, pancreas, adrenal glands or kidneys. There is low-level hypermetabolic activity within a single lymph node in the porta  hepatis (SUV max 4.0). There is no other hypermetabolic nodal activity. There are low-density adnexal lesions bilaterally status post hysterectomy without associated abnormal metabolic activity. There are multiple low-density renal lesions, including a dominant left renal cyst without suspicious metabolic activity. Nonobstructing calculus noted in the upper pole of the right kidney, diffuse atherosclerosis and sigmoid diverticulosis noted.  SKELETON  There is a single hypermetabolic osseous metastasis within the right ischium. This is lytic and has an SUV max of 10.2. Low-level activity anteriorly at C1-2 is likely inflammatory. There is a moderate thoracolumbar scoliosis.  IMPRESSION: 1. The dominant right upper lobe pulmonary mass is hypermetabolic, most consistent with bronchogenic carcinoma. There are associated left supraclavicular, right hilar and mediastinal nodal metastases, at least 3 hepatic metastases, and a right ischial nodal metastasis. 2. There are additional ground-glass opacities in both lungs which may represent synchronous adenocarcinoma. 3. Increased patchy dependent opacity in the right lower lobe, likely inflammatory.   Electronically Signed   By: Richardean Sale M.D.   On: 08/15/2014 12:23   Ct Biopsy  08/12/2014   CLINICAL DATA:  Right hepatic mass.  EXAM: CT GUIDED core BIOPSY OF right hepatic lobe mass.  ANESTHESIA/SEDATION: 1.5  Mg IV Versed; 100 mcg IV Fentanyl  Total Moderate Sedation Time: 40 minutes.  PROCEDURE: The procedure risks, benefits, and alternatives were explained to the patient, including pneumothorax, bleeding and infection. Questions regarding the procedure were encouraged and answered. The patient understands and consents to the  procedure.  The right upper quadrant of the abdomen was prepped with chlorhexidinein a sterile fashion, and a sterile drape was applied covering the operative field. Sterile gloves were used for the procedure. Local anesthesia was provided with  1% Lidocaine.  Under CT guidance, 17 gauge guiding needle was directed toward lesion in superior and posterior portion of right hepatic lobe. Four core samples were obtained using 18 gauge biopsy needle. Needle was then removed with the simultaneous injection of Gel-Foam slurry. Appropriate dressing was applied.  Complications: None immediate noted on post biopsy imaging.  FINDINGS: Right hepatic mass as described on prior studies.  IMPRESSION: Under CT guidance, percutaneous core biopsy was performed of right hepatic lobe lesion concerning for metastasis.   Electronically Signed   By: Marijo Conception, M.D.   On: 08/12/2014 11:56   Dg Chest Port 1 View  09/01/2014   CLINICAL DATA:  History of hypertension stroke COPD gastroesophageal reflux and breast cancer, recently diagnosed with metastatic lung cancer involving the liver for the past 2-3 weeks with progressive worsening shortness of breath over that time  EXAM: PORTABLE CHEST - 1 VIEW  COMPARISON:  09/01/2014 at 1:18  FINDINGS: Stable mild cardiac enlargement with central vascular congestion. Extensive bilateral perihilar hazy opacities. Small pleural effusions. Interstitial prominence.  IMPRESSION: Congestive heart failure with moderately severe pulmonary edema stable from prior study   Electronically Signed   By: Skipper Cliche M.D.   On: 09/01/2014 08:27   Dg Chest Portable 1 View  08/31/2014   CLINICAL DATA:  Progressive shortness of breath. History of lung cancer. Liver cancer. Breast cancer. COPD.  EXAM: PORTABLE CHEST - 1 VIEW  COMPARISON:  CT of the chest 08/18/2014.  FINDINGS: The heart is enlarged. Mild edema is present. Bilateral effusions are noted. Bibasilar airspace disease is evident. Nodular disease in the right upper lobe is partially obscured by edema.  IMPRESSION: 1. Cardiomegaly with mild edema and bilateral effusions compatible with congestive heart failure. 2. Right upper lobe nodule.   Electronically Signed   By: San Morelle  M.D.   On: 08/31/2014 17:14    ASSESSMENT: CT scan of the chest and abdomen revealed multiple lung nodule and liver nodule. In September 2013 patient had a right breast cancer triple negative disease T1 cN0 M0 tumor status post resection and radiation therapy in no further adjuvant chemotherapy PET scan has been reviewed Tissue of origin  study revealed that this is more likely to be lung cancer than any other primary Guident testing is negative for any positive tumor marker     PLAN:    I had prolonged discussion with patient and social service as well as patient's daughter today Patient has   BED  At [facility] where possibility of transferring for rehabilitation is being considered.  Patient and family desires to initiate chemotherapy prior to transfer there. Intent of chemotherapy is palliation and relief in symptoms and extending survival Patient was explained all the side effects of chemotherapy.  And informed consent has been obtained All the side effects of chemotherapy including myelosuppression, alopecia, nausea vomiting fatigue weakness.  Secondary infection, and   peripheral neuropathy .  Has been discussed in details. Informal consent has been obtained and will be documented by nurses in the chart Proceed with carboplatinum with AUC of 5 and Taxol 100 mg/m Total duration of visit was 45 minutes.  50% or more time was spent in counseling patient and family regarding prognosis and options of treatment and available  resources  Breast CA   Staging form: Breast, AJCC 7th Edition     Clinical: Stage IA (T1c, N0, M0) - Marni Griffon, MD   09/06/2014 4:45 PM   [place] @ [place] Telephone:(336) [phone]  Fax:(336) [place] OB: 07-26-1928  MR#: [id number]  LYY#:[id number]  Patient Care Team: [name], MD as PCP - General (Internal Medicine)  CHIEF COMPLAINT:  Chief Complaint  Patient presents with  . Shortness of Breath     Oncology History   1.  Diagnosis of right breast cancer T1 cN0 M0 tumor triple negative disease status post partial mastectomy in September 2013.  Patient underwent lumpectomy radiation therapy there is no record of evaluation by oncologist.  2pT1c pN0 (sn) cM0 triple negative grade 2 invasive carcinoma of the right breast lumpectomy and sentinel node study on 10/11/2011. Tumor size 1.8 cm, grade 2, margins uninvolved by invasive carcinoma.  DCIS present, closest margin is 5 mm. 2 sentinel lymph nodes negative. ER negative (0%), PR negative (0%), HER-2/neu FISH negative (HER2/CEP17 ratio 1.16). 3.  CT scan of the chest revealed multiple lung nodules and the one nodule in July of . 4.  Liver biopsy is consistent with adenocarcinoma most likely lung cancer versus cholangiocarcinoma breast cancer has been ruled out (July, 2016)     Breast CA   10/09/2011 Initial Diagnosis Breast CA stage IC triple negative disease status post lumpectomy and radiation therapy    Oncology Flowsheet 08/25/2014 09/01/2014 09/02/2014 09/03/2014 09/04/2014 09/05/2014 09/06/2014  LORazepam (ATIVAN) IV - 0.5 mg - - - - -  methylPREDNISolone sodium succinate 125 mg/2 mL (SOLU-MEDROL) IV 125 mg - - - - - -  ondansetron (ZOFRAN) IV - - - - - - -  predniSONE (DELTASONE) PO - 20 mg 20 mg 20 mg 20 mg 20 mg 20 mg    INTERVAL HISTORY Patient was admitted in the hospital .  Persistent dry hacking cough. Patient is now being considered for rehabilitation facility and desires to get chemotherapy for stage IV carcinoma of lung REVIEW OF SYSTEMS:    general status: Patient is feeling weak and tired.  No change in a performance status.  No chills.  No fever. HEENT:   No evidence of stomatitis Lungs: Persistent cough and shortness of breath Cardiac: No chest pain or paroxysmal nocturnal dyspnea GI: No nausea no vomiting no diarrhea no abdominal pain Skin: No rash Lower extremity no swelling Neurological system: No tingling.  No  numbness.  No other focal signs Musculoskeletal system no bony pains  As per HPI. Otherwise, a complete review of systems is negatve.  PAST MEDICAL HISTORY: Past Medical History  Diagnosis Date  . Hypertension   . CVA (cerebral infarction) 2010    Was on Plavix for 2 years, then switched to clopidogrel but d/c due to side effects ("sick")  . Nephrolithiasis     per patient, passes on her own  . Stroke   . COPD (chronic obstructive pulmonary disease)   . GERD (gastroesophageal reflux disease)   . Breast cancer 2013    breast cancer right-radiation-lumpectomy  . Lung cancer     PAST SURGICAL HISTORY: Past Surgical History  Procedure Laterality Date  . Breast lumpectomy      left side  . Abdominal hysterectomy  1960s    "bleeding profusely"  . Cholecystectomy    . Hand surgery      s/p fall    FAMILY HISTORY Family History  Problem Relation Age of Onset  . Throat cancer Mother     heavy smoker & drinker, died at 79 yo (1978)  . Heart failure Father   . Hypertension Sister   . Stroke Sister     ADVANCED DIRECTIVES:  No flowsheet data found.  HEALTH MAINTENANCE: History  Substance Use Topics  . Smoking status: Former Smoker -- 0.50 packs/day for 30 years    Types: Cigarettes    Quit date: 12/07/1978  . Smokeless tobacco: Never Used  . Alcohol Use: No      Allergies  Allergen Reactions  . Aspirin Shortness Of Breath, Swelling and Other (See Comments)    Pt states that she vomits blood.   Marland Kitchen Penicillins Anaphylaxis  . Nitrofuran Derivatives Nausea And Vomiting    Current Facility-Administered Medications  Medication Dose Route Frequency Provider Last Rate Last Dose  . budesonide-formoterol (SYMBICORT) 160-4.5 MCG/ACT inhaler 2 puff  2 puff Inhalation BID Lance Coon, MD   2 puff at 09/06/14 [phone]  . cefTRIAXone (ROCEPHIN) 1 g in dextrose 5 % 50 mL IVPB  1 g Intravenous Q24H Vishwanath Hande, MD   1 g at 09/06/14 1001  . clopidogrel (PLAVIX) tablet 75 mg   75 mg Oral Q breakfast Vaughan Basta, MD   75 mg at 09/06/14 0824  . diltiazem (CARDIZEM CD) 24 hr capsule 120 mg  120 mg Oral Daily Vishwanath Hande, MD   120 mg at 09/06/14 1000  . enalapril (VASOTEC) tablet 20 mg  20 mg Oral Daily Vaughan Basta, MD   20 mg at 09/06/14 1000  . furosemide (LASIX) tablet 40 mg  40 mg Oral Daily Vishwanath Hande, MD   40 mg at 09/06/14 1000  . guaiFENesin-codeine 100-10 MG/5ML solution 5 mL  5 mL Oral Q6H PRN Vishwanath Hande, MD   5 mL at 09/05/14 2135  . heparin injection 5,000 Units  5,000 Units Subcutaneous 3 times per day Vaughan Basta, MD   5,000 Units at 09/06/14 1352  . ipratropium-albuterol (DUONEB) 0.5-2.5 (3) MG/3ML nebulizer solution 3 mL  3 mL Nebulization Q6H Vishwanath Hande, MD   3 mL at 09/06/14 1437  . pantoprazole (PROTONIX) EC tablet 40 mg  40 mg Oral Daily Vaughan Basta, MD   40 mg at 09/06/14 1000  . predniSONE (DELTASONE) tablet 20 mg  20 mg Oral Q breakfast Vaughan Basta, MD   20 mg at 09/06/14 0824  . vitamin B-12 (CYANOCOBALAMIN) tablet 100 mcg  100 mcg Oral Daily Vaughan Basta, MD   100 mcg at 09/06/14 1000    OBJECTIVE:  Filed Vitals:   09/06/14 1315  BP: 95/44  Pulse: 100  Temp: 99.3 F (37.4 C)  Resp:      Body mass index is 27.92 kg/(m^2).    ECOG FS:2 - Symptomatic, <50% confined to bed  PHYSICAL EXAM: General  status: Performance status is 2.  Patient has not lost significant weight HEENT: No evidence of stomatitis. Sclera and conjunctivae :: No jaundice.   pale looking. Lungs: Emphysematous chest.  Wheezing on both sides. Lymphatic system: Cervical, axillary, inguinal, lymph nodes not palpable GI: Abdomen is soft.  No ascites.  Liver spleen not palpable.  No tenderness.  Bowel sounds are within normal limit Lower extremity: No edema Neurological system: Higher functions, cranial nerves intact no evidence of peripheral neuropathy. Skin: No rash.  No ecchymosis.Marland Kitchen   LAB  RESULTS:  Admission on 08/31/2014  No results displayed because visit has over 200 results.  STUDIES: Ct Abdomen Wo Contrast  2014-09-10   CLINICAL DATA:  Acute upper abdominal pain after hepatic biopsy.  EXAM: CT ABDOMEN WITHOUT CONTRAST  TECHNIQUE: Multidetector CT imaging of the abdomen was performed following the standard protocol without IV contrast.  COMPARISON:  CT scan of same day.  FINDINGS: Status post cholecystectomy. No pneumothorax is noted in visualized lung bases. Hypodense mass is noted in right hepatic lobe with Gel-Foam surrounding it consistent with recent percutaneous biopsy. There does appear to be a small subcapsular hematoma seen anteriorly over the right hepatic lobe. No other hemorrhage is noted. Atherosclerosis of abdominal aorta is noted without aneurysm formation. Large left renal cyst is again noted. The spleen and pancreas appear normal. Right renal cysts are stable. There is no evidence of bowel obstruction.  IMPRESSION: Right hepatic lesion is again noted which was the object of biopsy today. Gel-Foam is noted in and surrounding the lesion. This is expected finding status post biopsy. Small subcapsular hematoma is seen along the anterior portion of the right hepatic lobe.   Electronically Signed   By: Marijo Conception, M.D.   On: 09/10/14 12:59   Dg Chest 1 View  09/06/2014   CLINICAL DATA:  Known history of lung carcinoma with shortness of Breath  EXAM: CHEST  1 VIEW  COMPARISON:  09/01/2014  FINDINGS: Persistent right upper lobe mass lesion is identified. Improved aeration is noted in the right lung base left lung remains clear. The cardiac shadow is stable. No acute bony abnormality is noted.  IMPRESSION: Stable right upper lobe mass lesion. Improved aeration in the right lung base is noted.   Electronically Signed   By: Inez Catalina M.D.   On: 09/06/2014 13:38   Dg Chest 1 View  09/01/2014   CLINICAL DATA:  Acute onset of dyspnea.  Initial encounter.  EXAM:  CHEST  1 VIEW  COMPARISON:  Chest radiograph performed 08/31/2014  FINDINGS: Bilateral central airspace opacification is perhaps slightly worsened from the prior study and concerning for pulmonary edema. Small bilateral pleural effusions are noted. No pneumothorax is seen.  The cardiomediastinal silhouette is borderline enlarged. No acute osseous abnormalities are identified.  IMPRESSION: Slightly worsening pulmonary edema noted, with small bilateral pleural effusions. Borderline cardiomegaly.   Electronically Signed   By: Garald Balding M.D.   On: 09/01/2014 01:33   Ct Angio Chest Pe W/cm &/or Wo Cm  08/18/2014   CLINICAL DATA:  LEFT chest pain with cough starting this morning, history of lung cancer, RIGHT breast cancer, hypertension, stroke, former smoker  EXAM: CT ANGIOGRAPHY CHEST WITH CONTRAST  TECHNIQUE: Multidetector CT imaging of the chest was performed using the standard protocol during bolus administration of intravenous contrast. Multiplanar CT image reconstructions and MIPs were obtained to evaluate the vascular anatomy.  CONTRAST:  60m OMNIPAQUE IOHEXOL 350 MG/ML SOLN IV  COMPARISON:  07/29/2014  FINDINGS: Scattered atherosclerotic calcifications aorta, proximal great vessels, and coronary arteries.  No aortic aneurysm or dissection.  Pulmonary arteries well opacified with minimal dilatation of central pulmonary arteries.  Pulmonary arteries patent without evidence of pulmonary embolism.  Large cyst upper pole LEFT kidney 11.3 x 9.8 cm.  Multiple small RIGHT renal cyst.  Moderate-sized hiatal hernia.  Vague area of low attenuation lateral RIGHT lobe liver 3.3 cm greatest size corresponding to liver lesion which was previously biopsied.  Remaining visualized portions of upper abdomen unremarkable.  Enlarged RIGHT paratracheal lymph node 14 mm short axis image 35 previously 12 mm.  Enlarged precarinal lymph  node 13 mm short axis image 47 unchanged.  Adenopathy at superior aspect of RIGHT pulmonary  hilum unchanged, 14 mm short axis image 47.  RIGHT upper lobe mass anteriorly compatible with neoplasm, appears larger than on previous exam though this could be overestimated due to adjacent atelectasis, currently measuring 3.6 x 2.9 cm previously 3.0 x 2.9 cm.  Focus of infiltrate versus non solid opacity in RIGHT upper lobe more inferolaterally appears stable.  Additional stellate semi solid opacity in RIGHT lower lobe 2.5 cm diameter image 62 little changed.  Patchy sub solid, solid, and nodular infiltrates in RIGHT lower lobe again identified.  Subtle areas of non solid opacity in the LEFT lung are also again identified, grossly similar to previous exam no additional discrete mass or nodule identified.  Tiny RIGHT pleural effusion.  No pneumothorax.  Thoracolumbar dextro convex scoliosis with rotatory component.  Osseous demineralization.  Review of the MIP images confirms the above findings.  IMPRESSION: No evidence of pulmonary embolism.  Mild dilatation of the central pulmonary arteries raising question of pulmonary arterial hypertension.  RIGHT upper lobe mass compatible with neoplasm question minimally larger than on previous exam versus overestimation due to adjacent atelectasis which appears increased.  2.5 cm diameter stellate semi solid opacity RIGHT lower lobe question additional tumor.  Multiple additional solid, semi solid and non solid opacities in both lungs may represent inflammatory process though additional tumor not completely excluded, recommend attention on follow-up exams ; some with the more nodular appearing opacities in the RIGHT lower lobe have decreased in size since the previous exam.  Renal cysts including an 11.3 cm cyst at the upper pole of the LEFT kidney.  Small hiatal hernia.   Electronically Signed   By: Lavonia Dana M.D.   On: 08/18/2014 17:26   Nm Pet Image Initial (pi) Skull Base To Thigh  08/15/2014   CLINICAL DATA:  Subsequent treatment strategy for liver lesion biopsied  on 08/12/2014. History of right breast cancer in 2013 with last radiation therapy in 2014.  EXAM: NUCLEAR MEDICINE PET SKULL BASE TO THIGH  TECHNIQUE: 12.42 mCi F-18 FDG was injected intravenously. Full-ring PET imaging was performed from the skull base to thigh after the radiotracer. CT data was obtained and used for attenuation correction and anatomic localization.  FASTING BLOOD GLUCOSE:  Value: 79 mg/dl  COMPARISON:  Abdominal CT 08/12/2014.  Chest CT 07/29/2014.  FINDINGS: NECK  There are 2 adjacent left supraclavicular lymph nodes which are hypermetabolic. The larger of these measures 10 mm short axis on image 66 and has an SUV max of 4.1. No other hypermetabolic cervical lymph nodes demonstrated.There are no lesions of the pharyngeal mucosal space.  CHEST  The enlarged right paratracheal and right hilar lymph nodes on recent CT are hypermetabolic. The largest right paratracheal node measures 1.7 cm short axis on image 90 and has an SUV max of 10.0. No hypermetabolic subcarinal, left hilar or axillary adenopathy. The dominant spiculated right upper lobe mass is now partly obscured by atelectasis, but measures approximately 3.3 x 3.1 cm on image 84 and is hypermetabolic with an SUV max of 15.9. The other smaller ground-glass opacities in the right upper lobe (image 84) and in the superior segment of the right lower lobe (image 96) also demonstrate low-level hypermetabolic activity (SUV max 2.1). There is a 9 mm focal ground-glass opacity in the left upper lobe on image 84 which also demonstrates low-level activity. There is increased patchy airspace opacity dependently in the right lower  lobe with associated low level metabolic activity, likely inflammatory.  ABDOMEN/PELVIS  The recently biopsied dominant lesion in the right hepatic lobe is hypermetabolic. This lesion measures approximately 3.5 cm on image 128 and has an SUV max of 10.6. There are at least 2 smaller adjacent hypermetabolic metastases within the  right hepatic lobe. There is no suspicious activity within the spleen, pancreas, adrenal glands or kidneys. There is low-level hypermetabolic activity within a single lymph node in the porta hepatis (SUV max 4.0). There is no other hypermetabolic nodal activity. There are low-density adnexal lesions bilaterally status post hysterectomy without associated abnormal metabolic activity. There are multiple low-density renal lesions, including a dominant left renal cyst without suspicious metabolic activity. Nonobstructing calculus noted in the upper pole of the right kidney, diffuse atherosclerosis and sigmoid diverticulosis noted.  SKELETON  There is a single hypermetabolic osseous metastasis within the right ischium. This is lytic and has an SUV max of 10.2. Low-level activity anteriorly at C1-2 is likely inflammatory. There is a moderate thoracolumbar scoliosis.  IMPRESSION: 1. The dominant right upper lobe pulmonary mass is hypermetabolic, most consistent with bronchogenic carcinoma. There are associated left supraclavicular, right hilar and mediastinal nodal metastases, at least 3 hepatic metastases, and a right ischial nodal metastasis. 2. There are additional ground-glass opacities in both lungs which may represent synchronous adenocarcinoma. 3. Increased patchy dependent opacity in the right lower lobe, likely inflammatory.   Electronically Signed   By: Richardean Sale M.D.   On: 08/15/2014 12:23   Ct Biopsy  08/12/2014   CLINICAL DATA:  Right hepatic mass.  EXAM: CT GUIDED core BIOPSY OF right hepatic lobe mass.  ANESTHESIA/SEDATION: 1.5  Mg IV Versed; 100 mcg IV Fentanyl  Total Moderate Sedation Time: 40 minutes.  PROCEDURE: The procedure risks, benefits, and alternatives were explained to the patient, including pneumothorax, bleeding and infection. Questions regarding the procedure were encouraged and answered. The patient understands and consents to the procedure.  The right upper quadrant of the abdomen  was prepped with chlorhexidinein a sterile fashion, and a sterile drape was applied covering the operative field. Sterile gloves were used for the procedure. Local anesthesia was provided with 1% Lidocaine.  Under CT guidance, 17 gauge guiding needle was directed toward lesion in superior and posterior portion of right hepatic lobe. Four core samples were obtained using 18 gauge biopsy needle. Needle was then removed with the simultaneous injection of Gel-Foam slurry. Appropriate dressing was applied.  Complications: None immediate noted on post biopsy imaging.  FINDINGS: Right hepatic mass as described on prior studies.  IMPRESSION: Under CT guidance, percutaneous core biopsy was performed of right hepatic lobe lesion concerning for metastasis.   Electronically Signed   By: Marijo Conception, M.D.   On: 08/12/2014 11:56   Dg Chest Port 1 View  09/01/2014   CLINICAL DATA:  History of hypertension stroke COPD gastroesophageal reflux and breast cancer, recently diagnosed with metastatic lung cancer involving the liver for the past 2-3 weeks with progressive worsening shortness of breath over that time  EXAM: PORTABLE CHEST - 1 VIEW  COMPARISON:  09/01/2014 at 1:18  FINDINGS: Stable mild cardiac enlargement with central vascular congestion. Extensive bilateral perihilar hazy opacities. Small pleural effusions. Interstitial prominence.  IMPRESSION: Congestive heart failure with moderately severe pulmonary edema stable from prior study   Electronically Signed   By: Skipper Cliche M.D.   On: 09/01/2014 08:27   Dg Chest Portable 1 View  08/31/2014   CLINICAL DATA:  Progressive  shortness of breath. History of lung cancer. Liver cancer. Breast cancer. COPD.  EXAM: PORTABLE CHEST - 1 VIEW  COMPARISON:  CT of the chest 08/18/2014.  FINDINGS: The heart is enlarged. Mild edema is present. Bilateral effusions are noted. Bibasilar airspace disease is evident. Nodular disease in the right upper lobe is partially obscured by  edema.  IMPRESSION: 1. Cardiomegaly with mild edema and bilateral effusions compatible with congestive heart failure. 2. Right upper lobe nodule.   Electronically Signed   By: San Morelle M.D.   On: 08/31/2014 17:14    ASSESSMENT: Stage IV carcinoma   Of lung metastases to liver and lymph node Proceed with chemotherapy   MEDICAL DECISION MAKING:  All lab data has been reviewed. Intent of chemotherapy is palliation and relief in symptoms and extending survival All the side effects of chemotherapy including myelosuppression, alopecia, nausea vomiting fatigue weakness.  Secondary infection, and   peripheral neuropathy .  Has been discussed in details. Informal consent has been obtained and will be documented by nurses in the chart Patient was explained all the side effects of chemotherapy.  And informed consent has been obtained I had prolonged discussion with patient and her family as well as Education officer, museum Proceed with chemotherapy prior to rehabilitation transfer  Patient expressed understanding and was in agreement with this plan. She also understands that She can call clinic at any time with any questions, concerns, or complaints.    Breast CA   Staging form: Breast, AJCC 7th Edition     Clinical: Stage IA (T1c, N0, M0) - Marni Griffon, MD   09/06/2014 4:48 PM

## 2014-09-07 ENCOUNTER — Encounter
Admission: RE | Admit: 2014-09-07 | Discharge: 2014-09-07 | Disposition: A | Discharge status: Home / Self Care | Payer: PPO | Source: Ambulatory Visit | Attending: Internal Medicine | Admitting: Internal Medicine

## 2014-09-07 DIAGNOSIS — E875 Hyperkalemia: Secondary | ICD-10-CM | POA: Insufficient documentation

## 2014-09-07 DIAGNOSIS — E871 Hypo-osmolality and hyponatremia: Secondary | ICD-10-CM | POA: Insufficient documentation

## 2014-09-07 DIAGNOSIS — C3490 Malignant neoplasm of unspecified part of unspecified bronchus or lung: Secondary | ICD-10-CM | POA: Insufficient documentation

## 2014-09-07 DIAGNOSIS — I509 Heart failure, unspecified: Secondary | ICD-10-CM | POA: Insufficient documentation

## 2014-09-07 LAB — CBC WITH DIFFERENTIAL/PLATELET
BASOS PCT: 0 %
Basophils Absolute: 0 10*3/uL (ref 0–0.1)
EOS ABS: 0.2 10*3/uL (ref 0–0.7)
EOS PCT: 2 %
HCT: 31.1 % — ABNORMAL LOW (ref 35.0–47.0)
HEMOGLOBIN: 9.9 g/dL — AB (ref 12.0–16.0)
Lymphocytes Relative: 9 %
Lymphs Abs: 1.1 10*3/uL (ref 1.0–3.6)
MCH: 27.1 pg (ref 26.0–34.0)
MCHC: 31.8 g/dL — AB (ref 32.0–36.0)
MCV: 85.3 fL (ref 80.0–100.0)
MONOS PCT: 7 %
Monocytes Absolute: 0.9 10*3/uL (ref 0.2–0.9)
NEUTROS PCT: 82 %
Neutro Abs: 9.8 10*3/uL — ABNORMAL HIGH (ref 1.4–6.5)
Platelets: 344 10*3/uL (ref 150–440)
RBC: 3.65 MIL/uL — AB (ref 3.80–5.20)
RDW: 17.1 % — ABNORMAL HIGH (ref 11.5–14.5)
WBC: 12 10*3/uL — ABNORMAL HIGH (ref 3.6–11.0)

## 2014-09-07 LAB — COMPREHENSIVE METABOLIC PANEL
ALBUMIN: 2.7 g/dL — AB (ref 3.5–5.0)
ALT: 14 U/L (ref 14–54)
AST: 15 U/L (ref 15–41)
Alkaline Phosphatase: 50 U/L (ref 38–126)
Anion gap: 9 (ref 5–15)
BILIRUBIN TOTAL: 0.4 mg/dL (ref 0.3–1.2)
BUN: 26 mg/dL — AB (ref 6–20)
CO2: 35 mmol/L — ABNORMAL HIGH (ref 22–32)
CREATININE: 1.25 mg/dL — AB (ref 0.44–1.00)
Calcium: 9.1 mg/dL (ref 8.9–10.3)
Chloride: 93 mmol/L — ABNORMAL LOW (ref 101–111)
GFR calc Af Amer: 44 mL/min — ABNORMAL LOW (ref >60)
GFR calc non Af Amer: 38 mL/min — ABNORMAL LOW (ref >60)
GLUCOSE: 94 mg/dL (ref 65–99)
POTASSIUM: 4 mmol/L (ref 3.5–5.1)
Sodium: 137 mmol/L (ref 135–145)
Total Protein: 5.6 g/dL — ABNORMAL LOW (ref 6.5–8.1)

## 2014-09-07 NOTE — Progress Notes (Signed)
[place] @ [facility] [phone]  Fax:([phone]) [place] OB: 1928/07/27  MR#: [id number]  BOF#:[id number]  Patient Care Team: [name], MD as PCP - General (Internal Medicine)  CHIEF COMPLAINT:  Chief Complaint  Patient presents with  . Shortness of Breath    VISIT DIAGNOSIS:     ICD-9-CM ICD-10-CM   1. COPD exacerbation 491.21 J44.1   2. Acute exacerbation of CHF (congestive heart failure) 428.0 I50.9   3. NSTEMI (non-ST elevated myocardial infarction) 410.70 I21.4   4. CHF (congestive heart failure) 428.0 I50.9 DG Chest Port 1 View     DG Chest Port 1 View     AMB referral to CHF clinic     CANCELED: DG Chest 2 View     CANCELED: DG Chest 2 View     CANCELED: DG Chest 2 View     CANCELED: DG Chest 2 View  5. Dyspnea 786.09 R06.00 DG Chest 1 View     DG Chest 1 View     CANCELED: DG Chest 1 View     CANCELED: DG Chest 1 View  6. Shortness of breath 786.05 R06.02 DG Chest [facility] 1 View     DG Chest [place] 1 View  7. Cough 786.2 R05 DG Chest 1 View     DG Chest 1 View  8. Metastatic lung cancer (metastasis from lung to other site), right 162.9 C34.91 PACLitaxel (TAXOL) 168 mg in dextrose 5 % 250 mL chemo infusion (> 70m/m2)   199.1 C79.9 palonosetron (ALOXI) injection 0.25 mg     fosaprepitant (EMEND) 150 mg, dexamethasone (DECADRON) 12 mg in sodium chloride 0.9 % 145 mL IVPB     diphenhydrAMINE (BENADRYL) injection 12.5 mg     famotidine (PEPCID) IVPB 20 mg premix     ondansetron (ZOFRAN) 8 mg in sodium chloride 0.9 % 50 mL IVPB     CARBOplatin (PARAPLATIN) 310 mg in sodium chloride 0.9 % 100 mL chemo infusion     DISCONTINUED: CARBOplatin (PARAPLATIN) 310 mg in sodium chloride 0.9 % 100 mL chemo infusion     Oncology History   1.  Diagnosis of right breast cancer T1 cN0 M0 tumor triple negative disease status post partial mastectomy in September 2013.  Patient underwent lumpectomy radiation therapy there is no record of  evaluation by oncologist.  2pT1c pN0 (sn) cM0 triple negative grade 2 invasive carcinoma of the right breast lumpectomy and sentinel node study on 10/11/2011. Tumor size 1.8 cm, grade 2, margins uninvolved by invasive carcinoma.  DCIS present, closest margin is 5 mm. 2 sentinel lymph nodes negative. ER negative (0%), PR negative (0%), HER-2/neu FISH negative (HER2/CEP17 ratio 1.16). 3.  CT scan of the chest revealed multiple lung nodules and the one nodule in July of . 4.  Liver biopsy is consistent with adenocarcinoma most likely lung cancer versus cholangiocarcinoma breast cancer has been ruled out (July, 2016)     Breast CA   10/09/2011 Initial Diagnosis Breast CA stage IC triple negative disease status post lumpectomy and radiation therapy    Oncology Flowsheet 09/01/2014 09/02/2014 09/03/2014 09/04/2014 09/05/2014 09/06/2014 09/07/2014  dexamethasone (DECADRON) IV - - - - - - [ 12 mg ]  fosaprepitant (EMEND) IV - - - - - - [ 150 mg ]  LORazepam (ATIVAN) IV 0.5 mg - - - - - -  methylPREDNISolone sodium succinate 125 mg/2 mL (SOLU-MEDROL) IV - - - - - - -  ondansetron (ZOFRAN) IV - - - - - - -  PACLitaxel (TAXOL) IV - - - - - - 168 mg  palonosetron (ALOXI) IV - - - - - - 0.25 mg  predniSONE (DELTASONE) PO 20 mg 20 mg 20 mg 20 mg 20 mg 20 mg 20 mg    INTERVAL HISTORY:  81 year old lady started feeling weak and tired.  Was evaluated by primary care physician initially she received a course of prednisone and antibiotic without much relief than patient was evaluated had a chest x-ray followed by CT scan.  CT scan revealed right upper lobe lung mass multiple lung nodules and a liver nodule.  Patient had a previous history of node-negative carcinoma of breast. Feeling weak and tired.  No hemoptysis no chest pain no bony pains. Patient was admitted in the hospital feeling somewhat better. Dry hacking cough.  September 07, 2014 Patient is going to get chemotherapy with carboplatinum and Taxol Patient is  also on oxygen.  Blood pressure was slightly low.  Patient and number of questions regarding chemotherapy which were answered REVIEW OF STEMS:    general status: Patient is feeling weak and tired.  No change in a performance status.  No chills.  No fever.  HEENT  No evidence of stomatitis Lungs: Dry hacking cough.  Increasing shortness of breath Patient is acutely short of breath started early this morning.  No chills.  No fever. Cardiac: No chest pain or paroxysmal nocturnal dyspnea GI: No nausea no vomiting no diarrhea no abdominal pain Poor appetite Skin: No rash Lower extremity no swelling Neurological system: No tingling.  No numbness.  No other focal signs Musculoskeletal system no bony pains  GU: No hematuria.  No dysuria. Extremely depressed.  As per HPI. Otherwise, a complete review of systems is negatve.  PAST MEDICAL HISTORY: Past Medical History  Diagnosis Date  . Hypertension   . CVA (cerebral infarction) 2010    Was on Plavix for 2 years, then switched to clopidogrel but d/c due to side effects ("sick")  . Nephrolithiasis     per patient, passes on her own  . Stroke   . COPD (chronic obstructive pulmonary disease)   . GERD (gastroesophageal reflux disease)   . Breast cancer 2013    breast cancer right-radiation-lumpectomy  . Lung cancer     PAST SURGICAL HISTORY: Past Surgical History  Procedure Laterality Date  . Breast lumpectomy      left side  . Abdominal hysterectomy  1960s    "bleeding profusely"  . Cholecystectomy    . Hand surgery      s/p fall    FAMILY HISTORY Family History  Problem Relation Age of Onset  . Throat cancer Mother     heavy smoker & drinker, died at 79 yo (1978)  . Heart failure Father   . Hypertension Sister   . Stroke Sister          ADVANCED DIRECTIVES:  Patient does have advance healthcare directive, Patient   does not desire to make any changes  HEALTH MAINTENANCE: Social History  Substance Use Topics  .  Smoking status: Former Smoker -- 0.50 packs/day for 30 years    Types: Cigarettes    Quit date: 12/07/1978  . Smokeless tobacco: Never Used  . Alcohol Use: No     Allergies  Allergen Reactions  . Aspirin Shortness Of Breath, Swelling and Other (See Comments)    Pt states that she vomits blood.   Marland Kitchen Penicillins Anaphylaxis  . Nitrofuran Derivatives Nausea And Vomiting    Current Facility-Administered  Medications  Medication Dose Route Frequency Provider Last Rate Last Dose  . budesonide-formoterol (SYMBICORT) 160-4.5 MCG/ACT inhaler 2 puff  2 puff Inhalation BID Lance Coon, MD   2 puff at 09/07/14 0813  . CARBOplatin (PARAPLATIN) 310 mg in sodium chloride 0.9 % 100 mL chemo infusion  310 mg Intravenous Once Forest Gleason, MD      . cefTRIAXone (ROCEPHIN) 1 g in dextrose 5 % 50 mL IVPB  1 g Intravenous Q24H [name], MD   1 g at 09/07/14 0812  . clopidogrel (PLAVIX) tablet 75 mg  75 mg Oral Q breakfast Vaughan Basta, MD   75 mg at 09/07/14 0370  . diltiazem (CARDIZEM CD) 24 hr capsule 120 mg  120 mg Oral Daily Vishwanath Hande, MD   120 mg at 09/07/14 0811  . enalapril (VASOTEC) tablet 20 mg  20 mg Oral Daily Vaughan Basta, MD   20 mg at 09/07/14 4888  . furosemide (LASIX) tablet 40 mg  40 mg Oral Daily [name], MD   40 mg at 09/07/14 9169  . guaiFENesin-codeine 100-10 MG/5ML solution 5 mL  5 mL Oral Q6H PRN Vishwanath Hande, MD   5 mL at 09/05/14 2135  . heparin injection 5,000 Units  5,000 Units Subcutaneous 3 times per day Vaughan Basta, MD   5,000 Units at 09/07/14 1509  . ipratropium-albuterol (DUONEB) 0.5-2.5 (3) MG/3ML nebulizer solution 3 mL  3 mL Nebulization QID [name], MD   3 mL at 09/07/14 0752  . ondansetron (ZOFRAN) 8 mg in sodium chloride 0.9 % 50 mL IVPB  8 mg Intravenous Q6H PRN Forest Gleason, MD      . PACLitaxel (TAXOL) 168 mg in dextrose 5 % 250 mL chemo infusion (> 71m/m2)  168 mg Intravenous Once JForest Gleason MD 93  mL/hr at 09/07/14 1502 168 mg at 09/07/14 1502  . pantoprazole (PROTONIX) EC tablet 40 mg  40 mg Oral Daily VVaughan Basta MD   40 mg at 09/07/14 0811  . predniSONE (DELTASONE) tablet 20 mg  20 mg Oral Q breakfast VVaughan Basta MD   20 mg at 09/07/14 04503 . vitamin B-12 (CYANOCOBALAMIN) tablet 100 mcg  100 mcg Oral Daily VVaughan Basta MD   100 mcg at 09/07/14 0815    OBJECTIVE: PHYSICAL EXAM:has not lost sig General  status: Performance status is good.  Patient is feeling weak and tired.  Poor appetite.  Very depressed. HEENT: No evidence of stomatitis. Sclera and conjunctivae :: No jaundice.   pale looking. Lungs: bilateral  rhonchi.  Lymphatic system: Cervical, axillary, inguinal, lymph nodes not palpable Lateral rhonchi.  Diminished air entry on both sides. GI: Abdomen is soft.  No ascites.  Liver spleen not palpable.  No tenderness.  Bowel sounds are within normal limit Lower extremity: No edema Neurological system: Higher functions, cranial nerves intact no evidence of peripheral neuropathy. Skin: No rash.  No ecchymosis..Danley DankerVitals:   09/07/14 1606  BP: 103/51  Pulse: 72  Temp:   Resp:      Body mass index is 27.39 kg/(m^2).    ECOG FS:1 - Symptomatic but completely ambulatory  LAB RESULTS:  Admission on 08/31/2014  No results displayed because visit has over 200 results.       STUDIES: Ct Abdomen Wo Contrast  7August 10, 2016  CLINICAL DATA:  Acute upper abdominal pain after hepatic biopsy.  EXAM: CT ABDOMEN WITHOUT CONTRAST  TECHNIQUE: Multidetector CT imaging of the abdomen was performed following the standard protocol without  IV contrast.  COMPARISON:  CT scan of same day.  FINDINGS: Status post cholecystectomy. No pneumothorax is noted in visualized lung bases. Hypodense mass is noted in right hepatic lobe with Gel-Foam surrounding it consistent with recent percutaneous biopsy. There does appear to be a small subcapsular hematoma seen anteriorly  over the right hepatic lobe. No other hemorrhage is noted. Atherosclerosis of abdominal aorta is noted without aneurysm formation. Large left renal cyst is again noted. The spleen and pancreas appear normal. Right renal cysts are stable. There is no evidence of bowel obstruction.  IMPRESSION: Right hepatic lesion is again noted which was the object of biopsy today. Gel-Foam is noted in and surrounding the lesion. This is expected finding status post biopsy. Small subcapsular hematoma is seen along the anterior portion of the right hepatic lobe.   Electronically Signed   By: Marijo Conception, M.D.   On: 08/12/2014 12:59   Dg Chest 1 View  09/06/2014   CLINICAL DATA:  Known history of lung carcinoma with shortness of Breath  EXAM: CHEST  1 VIEW  COMPARISON:  09/01/2014  FINDINGS: Persistent right upper lobe mass lesion is identified. Improved aeration is noted in the right lung base left lung remains clear. The cardiac shadow is stable. No acute bony abnormality is noted.  IMPRESSION: Stable right upper lobe mass lesion. Improved aeration in the right lung base is noted.   Electronically Signed   By: Inez Catalina M.D.   On: 09/06/2014 13:38   Dg Chest 1 View  09/01/2014   CLINICAL DATA:  Acute onset of dyspnea.  Initial encounter.  EXAM: CHEST  1 VIEW  COMPARISON:  Chest radiograph performed 08/31/2014  FINDINGS: Bilateral central airspace opacification is perhaps slightly worsened from the prior study and concerning for pulmonary edema. Small bilateral pleural effusions are noted. No pneumothorax is seen.  The cardiomediastinal silhouette is borderline enlarged. No acute osseous abnormalities are identified.  IMPRESSION: Slightly worsening pulmonary edema noted, with small bilateral pleural effusions. Borderline cardiomegaly.   Electronically Signed   By: Garald Balding M.D.   On: 09/01/2014 01:33   Ct Angio Chest Pe W/cm &/or Wo Cm  08/18/2014   CLINICAL DATA:  LEFT chest pain with cough starting this morning,  history of lung cancer, RIGHT breast cancer, hypertension, stroke, former smoker  EXAM: CT ANGIOGRAPHY CHEST WITH CONTRAST  TECHNIQUE: Multidetector CT imaging of the chest was performed using the standard protocol during bolus administration of intravenous contrast. Multiplanar CT image reconstructions and MIPs were obtained to evaluate the vascular anatomy.  CONTRAST:  61m OMNIPAQUE IOHEXOL 350 MG/ML SOLN IV  COMPARISON:  07/29/2014  FINDINGS: Scattered atherosclerotic calcifications aorta, proximal great vessels, and coronary arteries.  No aortic aneurysm or dissection.  Pulmonary arteries well opacified with minimal dilatation of central pulmonary arteries.  Pulmonary arteries patent without evidence of pulmonary embolism.  Large cyst upper pole LEFT kidney 11.3 x 9.8 cm.  Multiple small RIGHT renal cyst.  Moderate-sized hiatal hernia.  Vague area of low attenuation lateral RIGHT lobe liver 3.3 cm greatest size corresponding to liver lesion which was previously biopsied.  Remaining visualized portions of upper abdomen unremarkable.  Enlarged RIGHT paratracheal lymph node 14 mm short axis image 35 previously 12 mm.  Enlarged precarinal lymph node 13 mm short axis image 47 unchanged.  Adenopathy at superior aspect of RIGHT pulmonary hilum unchanged, 14 mm short axis image 47.  RIGHT upper lobe mass anteriorly compatible with neoplasm, appears larger than on previous exam  though this could be overestimated due to adjacent atelectasis, currently measuring 3.6 x 2.9 cm previously 3.0 x 2.9 cm.  Focus of infiltrate versus non solid opacity in RIGHT upper lobe more inferolaterally appears stable.  Additional stellate semi solid opacity in RIGHT lower lobe 2.5 cm diameter image 62 little changed.  Patchy sub solid, solid, and nodular infiltrates in RIGHT lower lobe again identified.  Subtle areas of non solid opacity in the LEFT lung are also again identified, grossly similar to previous exam no additional discrete  mass or nodule identified.  Tiny RIGHT pleural effusion.  No pneumothorax.  Thoracolumbar dextro convex scoliosis with rotatory component.  Osseous demineralization.  Review of the MIP images confirms the above findings.  IMPRESSION: No evidence of pulmonary embolism.  Mild dilatation of the central pulmonary arteries raising question of pulmonary arterial hypertension.  RIGHT upper lobe mass compatible with neoplasm question minimally larger than on previous exam versus overestimation due to adjacent atelectasis which appears increased.  2.5 cm diameter stellate semi solid opacity RIGHT lower lobe question additional tumor.  Multiple additional solid, semi solid and non solid opacities in both lungs may represent inflammatory process though additional tumor not completely excluded, recommend attention on follow-up exams ; some with the more nodular appearing opacities in the RIGHT lower lobe have decreased in size since the previous exam.  Renal cysts including an 11.3 cm cyst at the upper pole of the LEFT kidney.  Small hiatal hernia.   Electronically Signed   By: Lavonia Dana M.D.   On: 08/18/2014 17:26   Nm Pet Image Initial (pi) Skull Base To Thigh  08/15/2014   CLINICAL DATA:  Subsequent treatment strategy for liver lesion biopsied on 08/12/2014. History of right breast cancer in 2013 with last radiation therapy in 2014.  EXAM: NUCLEAR MEDICINE PET SKULL BASE TO THIGH  TECHNIQUE: 12.42 mCi F-18 FDG was injected intravenously. Full-ring PET imaging was performed from the skull base to thigh after the radiotracer. CT data was obtained and used for attenuation correction and anatomic localization.  FASTING BLOOD GLUCOSE:  Value: 79 mg/dl  COMPARISON:  Abdominal CT 08/12/2014.  Chest CT 07/29/2014.  FINDINGS: NECK  There are 2 adjacent left supraclavicular lymph nodes which are hypermetabolic. The larger of these measures 10 mm short axis on image 66 and has an SUV max of 4.1. No other hypermetabolic cervical  lymph nodes demonstrated.There are no lesions of the pharyngeal mucosal space.  CHEST  The enlarged right paratracheal and right hilar lymph nodes on recent CT are hypermetabolic. The largest right paratracheal node measures 1.7 cm short axis on image 90 and has an SUV max of 10.0. No hypermetabolic subcarinal, left hilar or axillary adenopathy. The dominant spiculated right upper lobe mass is now partly obscured by atelectasis, but measures approximately 3.3 x 3.1 cm on image 84 and is hypermetabolic with an SUV max of 15.9. The other smaller ground-glass opacities in the right upper lobe (image 84) and in the superior segment of the right lower lobe (image 96) also demonstrate low-level hypermetabolic activity (SUV max 2.1). There is a 9 mm focal ground-glass opacity in the left upper lobe on image 84 which also demonstrates low-level activity. There is increased patchy airspace opacity dependently in the right lower lobe with associated low level metabolic activity, likely inflammatory.  ABDOMEN/PELVIS  The recently biopsied dominant lesion in the right hepatic lobe is hypermetabolic. This lesion measures approximately 3.5 cm on image 128 and has an SUV max of  10.6. There are at least 2 smaller adjacent hypermetabolic metastases within the right hepatic lobe. There is no suspicious activity within the spleen, pancreas, adrenal glands or kidneys. There is low-level hypermetabolic activity within a single lymph node in the porta hepatis (SUV max 4.0). There is no other hypermetabolic nodal activity. There are low-density adnexal lesions bilaterally status post hysterectomy without associated abnormal metabolic activity. There are multiple low-density renal lesions, including a dominant left renal cyst without suspicious metabolic activity. Nonobstructing calculus noted in the upper pole of the right kidney, diffuse atherosclerosis and sigmoid diverticulosis noted.  SKELETON  There is a single hypermetabolic osseous  metastasis within the right ischium. This is lytic and has an SUV max of 10.2. Low-level activity anteriorly at C1-2 is likely inflammatory. There is a moderate thoracolumbar scoliosis.  IMPRESSION: 1. The dominant right upper lobe pulmonary mass is hypermetabolic, most consistent with bronchogenic carcinoma. There are associated left supraclavicular, right hilar and mediastinal nodal metastases, at least 3 hepatic metastases, and a right ischial nodal metastasis. 2. There are additional ground-glass opacities in both lungs which may represent synchronous adenocarcinoma. 3. Increased patchy dependent opacity in the right lower lobe, likely inflammatory.   Electronically Signed   By: Richardean Sale M.D.   On: 08/15/2014 12:23   Ct Biopsy  08/12/2014   CLINICAL DATA:  Right hepatic mass.  EXAM: CT GUIDED core BIOPSY OF right hepatic lobe mass.  ANESTHESIA/SEDATION: 1.5  Mg IV Versed; 100 mcg IV Fentanyl  Total Moderate Sedation Time: 40 minutes.  PROCEDURE: The procedure risks, benefits, and alternatives were explained to the patient, including pneumothorax, bleeding and infection. Questions regarding the procedure were encouraged and answered. The patient understands and consents to the procedure.  The right upper quadrant of the abdomen was prepped with chlorhexidinein a sterile fashion, and a sterile drape was applied covering the operative field. Sterile gloves were used for the procedure. Local anesthesia was provided with 1% Lidocaine.  Under CT guidance, 17 gauge guiding needle was directed toward lesion in superior and posterior portion of right hepatic lobe. Four core samples were obtained using 18 gauge biopsy needle. Needle was then removed with the simultaneous injection of Gel-Foam slurry. Appropriate dressing was applied.  Complications: None immediate noted on post biopsy imaging.  FINDINGS: Right hepatic mass as described on prior studies.  IMPRESSION: Under CT guidance, percutaneous core biopsy  was performed of right hepatic lobe lesion concerning for metastasis.   Electronically Signed   By: Marijo Conception, M.D.   On: 08/12/2014 11:56   Dg Chest Port 1 View  09/01/2014   CLINICAL DATA:  History of hypertension stroke COPD gastroesophageal reflux and breast cancer, recently diagnosed with metastatic lung cancer involving the liver for the past 2-3 weeks with progressive worsening shortness of breath over that time  EXAM: PORTABLE CHEST - 1 VIEW  COMPARISON:  09/01/2014 at 1:18  FINDINGS: Stable mild cardiac enlargement with central vascular congestion. Extensive bilateral perihilar hazy opacities. Small pleural effusions. Interstitial prominence.  IMPRESSION: Congestive heart failure with moderately severe pulmonary edema stable from prior study   Electronically Signed   By: Skipper Cliche M.D.   On: 09/01/2014 08:27   Dg Chest Portable 1 View  08/31/2014   CLINICAL DATA:  Progressive shortness of breath. History of lung cancer. Liver cancer. Breast cancer. COPD.  EXAM: PORTABLE CHEST - 1 VIEW  COMPARISON:  CT of the chest 08/18/2014.  FINDINGS: The heart is enlarged. Mild edema is present. Bilateral effusions  are noted. Bibasilar airspace disease is evident. Nodular disease in the right upper lobe is partially obscured by edema.  IMPRESSION: 1. Cardiomegaly with mild edema and bilateral effusions compatible with congestive heart failure. 2. Right upper lobe nodule.   Electronically Signed   By: San Morelle M.D.   On: 08/31/2014 17:14    ASSESSMENT: CT scan of the chest and abdomen revealed multiple lung nodule and liver nodule. In September 2013 patient had a right breast cancer triple negative disease T1 cN0 M0 tumor status post resection and radiation therapy in no further adjuvant chemotherapy PET scan has been reviewed Tissue of origin  study revealed that this is more likely to be lung cancer than any other primary Guident testing is negative for any positive tumor marker We  will proceed with chemotherapy with carboplatinum and split dose of Taxol Patient can be discharged in next 24-48 hours depending on her reaction to chemotherapy   Patient  is planning to go for rehabilitation    PLAN:    I had prolonged discussion with patient and social service as well as patient's daughter today Patient has   BED  At [facility] where possibility of transferring for rehabilitation is being considered.  Patient and family desires to initiate chemotherapy prior to transfer there. Intent of chemotherapy is palliation and relief in symptoms and extending survival Patient was explained all the side effects of chemotherapy.  And informed consent has been obtained All the side effects of chemotherapy including myelosuppression, alopecia, nausea vomiting fatigue weakness.  Secondary infection, and   peripheral neuropathy .  Has been discussed in details. Informal consent has been obtained and will be documented by nurses in the chart Proceed with carboplatinum with AUC of 5 and Taxol 100 mg/m Total duration of visit was 45 minutes.  50% or more time was spent in counseling patient and family regarding prognosis and options of treatment and available resources  Breast CA   Staging form: Breast, AJCC 7th Edition     Clinical: Stage IA (T1c, N0, M0) - Marni Griffon, MD   09/07/2014 4:49 PM

## 2014-09-07 NOTE — Care Management Important Message (Signed)
Important Message  Patient Details  Name: [name] MRN: [id number] Date of Birth: 01/16/1929   Medicare Important Message Given:  Yes-fourth notification given    [name] 09/07/2014, 1:44 PM

## 2014-09-07 NOTE — Progress Notes (Signed)
PROGRESS NOTE  Jo Frank WIO:[id number] DOB: November 26, 1928 DOA: 08/31/2014 PCP: [name], MD  Subjective:  81 y/o f  With hx of HTN, CVA, COPD, Recent diagnosis of Ca lung with mets admitted with progressive shortness of breath, and cough. No improvement with out pt treatment of tapering Prednisone and antibiotics. Overall doing better- but still has a persistent cough     Objective: BP 123/49 mmHg  Pulse 97  Temp(Src) 98 F (36.7 C) (Oral)  Resp 18  Ht '5\' 1"'$  (1.549 m)  Wt 65.726 kg (144 lb 14.4 oz)  BMI 27.39 kg/m2  SpO2 97%  Intake/Output Summary (Last 24 hours) at 09/07/14 0817 Last data filed at 09/07/14 0501  Gross per 24 hour  Intake    530 ml  Output   2825 ml  Net  -2295 ml   Filed Weights   09/05/14 0450 09/06/14 0500 09/07/14 0435  Weight: 67.314 kg (148 lb 6.4 oz) 66.996 kg (147 lb 11.2 oz) 65.726 kg (144 lb 14.4 oz)    Exam: HEENT: [place], AT  General: Not in distress  Cardiovascular: S1 S2  Respiratory: Rhonchi + Bilat  Abdomen: Soft. Non tender  Neuro:Non Focal  Extremities; 1 + edema   Data Reviewed: Basic Metabolic Panel:  Recent Labs Lab 09/03/14 0511 09/04/14 0443 09/05/14 0440 09/06/14 0425 09/07/14 0518  NA 137 136 138 137 137  K 3.7 3.8 4.2 3.9 4.0  CL 94* 92* 94* 92* 93*  CO2 35* 35* 35* 36* 35*  GLUCOSE 94 108* 109* 101* 94  BUN 26* 29* 34* 30* 26*  CREATININE 1.36* 1.25* 1.18* 1.21* 1.25*  CALCIUM 8.7* 8.6* 8.9 9.2 9.1   Liver Function Tests:  Recent Labs Lab 08/31/14 1651 09/06/14 0425 09/07/14 0518  AST 31 14* 15  ALT '26 16 14  '$ ALKPHOS 69 47 50  BILITOT 0.4 0.5 0.4  PROT 6.1* 5.7* 5.6*  ALBUMIN 2.7* 2.7* 2.7*   No results for input(s): LIPASE, AMYLASE in the last 168 hours. No results for input(s): AMMONIA in the last 168 hours. CBC:  Recent Labs Lab 09/03/14 0511 09/04/14 0443 09/05/14 0440 09/06/14 0425 09/07/14 0518  WBC 11.2* 10.8 10.9 9.8 12.0*  NEUTROABS 8.8* 8.7* 8.9* 8.2* 9.8*  HGB  9.4* 9.2* 9.2* 9.8* 9.9*  HCT 29.5* 28.1* 28.6* 30.4* 31.1*  MCV 84.7 85.2 85.7 85.2 85.3  PLT 364 335 310 324 344   Cardiac Enzymes:    Recent Labs Lab 08/31/14 1651 08/31/14 1950 09/01/14 0257  TROPONINI 0.34* 0.34* 0.34*   BNP (last 3 results)  Recent Labs  08/31/14 1651  BNP 733.0*    ProBNP (last 3 results) No results for input(s): PROBNP in the last 8760 hours.  CBG: No results for input(s): GLUCAP in the last 168 hours.  Recent Results (from the past 240 hour(s))  Blood culture (routine x 2)     Status: None   Collection Time: 08/31/14  4:52 PM  Result Value Ref Range Status   Specimen Description BLOOD LEFT ASSIST CONTROL  Final   Special Requests BOTTLES DRAWN AEROBIC AND ANAEROBIC 3ML  Final   Culture NO GROWTH 5 DAYS  Final   Report Status 09/05/2014 FINAL  Final  Blood culture (routine x 2)     Status: None   Collection Time: 08/31/14  4:52 PM  Result Value Ref Range Status   Specimen Description BLOOD RIGHT ASSIST CONTROL  Final   Special Requests BOTTLES DRAWN AEROBIC AND ANAEROBIC 3ML  Final  Culture NO GROWTH 5 DAYS  Final   Report Status 09/05/2014 FINAL  Final  Culture, expectorated sputum-assessment     Status: None   Collection Time: 09/02/14  3:23 PM  Result Value Ref Range Status   Specimen Description EXPECTORATED SPUTUM  Final   Special Requests NONE  Final   Sputum evaluation THIS SPECIMEN IS ACCEPTABLE FOR SPUTUM CULTURE  Final   Report Status 09/02/2014 FINAL  Final  Culture, respiratory (NON-Expectorated)     Status: None   Collection Time: 09/02/14  3:23 PM  Result Value Ref Range Status   Specimen Description EXPECTORATED SPUTUM  Final   Special Requests NONE Reflexed from [id number]  Final   Gram Stain   Final    GOOD SPECIMEN - 80-90% WBCS MODERATE WBC SEEN MODERATE GRAM POSITIVE COCCI IN CLUSTERS IN PAIRS FEW GRAM NEGATIVE RODS FEW GRAM NEGATIVE COCCOBACILLI    Culture APPEARS TO BE NORMAL FLORA  Final   Report Status  09/05/2014 FINAL  Final     Studies: Dg Chest 1 View  09/06/2014   CLINICAL DATA:  Known history of lung carcinoma with shortness of Breath  EXAM: CHEST  1 VIEW  COMPARISON:  09/01/2014  FINDINGS: Persistent right upper lobe mass lesion is identified. Improved aeration is noted in the right lung base left lung remains clear. The cardiac shadow is stable. No acute bony abnormality is noted.  IMPRESSION: Stable right upper lobe mass lesion. Improved aeration in the right lung base is noted.   Electronically Signed   By: Inez Catalina M.D.   On: 09/06/2014 13:38    Scheduled Meds: . budesonide-formoterol  2 puff Inhalation BID  . CARBOplatin  310 mg Intravenous Once  . cefTRIAXone (ROCEPHIN)  IV  1 g Intravenous Q24H  . clopidogrel  75 mg Oral Q breakfast  . diltiazem  120 mg Oral Daily  . diphenhydrAMINE  12.5 mg Intravenous Once  . enalapril  20 mg Oral Daily  . famotidine  20 mg Intravenous Once  . fosaprepitant (EMEND) IV infusion 150 mg + dexamethasone   Intravenous Once  . furosemide  40 mg Oral Daily  . heparin  5,000 Units Subcutaneous 3 times per day  . ipratropium-albuterol  3 mL Nebulization QID  . PACLitaxel  168 mg Intravenous Once  . palonosetron  0.25 mg Intravenous Once  . pantoprazole  40 mg Oral Daily  . predniSONE  20 mg Oral Q breakfast  . vitamin B-12  100 mcg Oral Daily    Continuous Infusions:   Assessment/Plan: 1 Acute Shortness of breath secondary to COPD and Bronchitis On  IV Rocephin and Prednisone ECHO : EF 60-65% CXR : Rt upper lobe mass lesion with improved aeration Rt base  Continue SVN's, Symbicort  and O2  2 Ca Lung with Liver mets: For chemo today and tomorrow 3 Paroxysmal A-fib: On Cardizem 4 HTN: On Enalapril- Continue to monitor 5 Hx of CVA -On Plavix  6 Physical Therapy- For Rehab  - most likely  tomorrow    Code Status: Full  Family Communication: Grand daughter      [name]   09/07/2014, 8:17 AM  LOS: 7 days

## 2014-09-07 NOTE — Clinical Social Work Note (Signed)
Clinical Social Worker updated [facility]'s potential discharge for 09/08/2014. Pt is to receive chemo today. Pt will likely be discharged to [facility]. CSW will continue to follow.   Darden Dates, MSW, LCSW Clinical Social Worker  [phone]

## 2014-09-07 NOTE — Plan of Care (Signed)
Problem: Discharge Progression Outcomes Goal: Other Discharge Outcomes/Goals Outcome: Progressing Plan of Care Progress to Goal: Pt rec'd very first IV chemo tx today - was a little nervous.  Will get 2nd part of chemo tonight.  Had episode of Low BP.  Dr. Oliva Bustard didn't want to bolus b/c of CHF. Pt wked w/PT today.  She is a little unsteady/dizzy - sd she had tingling like pins in her feet. No other c/o pain. Pt's husband is in respite care at hospice w/Parkinsons.  Daughter lives in [place]. [place], [place].  Pt will probably be d/ced to [facility] tomorrow after chemo.

## 2014-09-07 NOTE — Plan of Care (Signed)
Problem: Discharge Progression Outcomes Goal: Discharge plan in place and appropriate Pt prefers to be called mrs. Bailly Pt with hx of hypertension, CVA, COPD, gastroesophageal reflux disease, breast cancer in the past, recently diagnosed with the lung cancer and metastases in the liver for last 2-3 weeks has been been having progressive worsening of shortness of breath  HIGH Fall precautions Goal: Other Discharge Outcomes/Goals Pt without c/o of sob, cp or other s/s of cardiac r/t events. Pt without c/o pain, pt for chemotherapy in the am, questions answered for both pt and daughter, support given

## 2014-09-07 NOTE — Progress Notes (Signed)
Physical Therapy Treatment Patient Details Name: Jo Frank MRN: [id number] DOB: 07/27/1928 Today's Date: 09/07/2014    History of Present Illness 81 yo female with multiple areas of CA, mets in liver and lungs from breast CA.  Admitted with SOB and cough, with B pleural effusion and B pulmonary edema.    PT Comments    Pt agreeable to PT. Pt notes she has been feeling quite dizzy today and does not with out of bed. Agreeable to exercises. Post exercises, pt offered to sit edge of bed. Pt did not feel she could tolerate. Pt able to readjust herself upright in bed with bed flat and use of rails. Pt positioned in upright sitting position in bed in preparation for lunch with no worsening symptoms.   Follow Up Recommendations  SNF     Equipment Recommendations  Rolling walker with 5" wheels    Recommendations for Other Services       Precautions / Restrictions Precautions Precautions: Fall Restrictions Weight Bearing Restrictions: No    Mobility  Bed Mobility Overal bed mobility: Modified Independent Bed Mobility:  (repositioning in bed)           General bed mobility comments: Refused sitting edge of bed due to dizziiness. Able to reposition upward in bed with use of rails and bed flat  Transfers                 General transfer comment: Refused up in bed or out of bed due to dizziness  Ambulation/Gait                 Stairs            Wheelchair Mobility    Modified Rankin (Stroke Patients Only)       Balance                                    Cognition Arousal/Alertness: Awake/alert Behavior During Therapy: WFL for tasks assessed/performed Overall Cognitive Status: Within Functional Limits for tasks assessed                      Exercises General Exercises - Lower Extremity Ankle Circles/Pumps: AROM;Both;20 reps;Supine Quad Sets: Strengthening;Both;20 reps;Supine Gluteal Sets: Strengthening;Both;20  reps;Supine Short Arc Quad: AROM;Both;20 reps;Supine Heel Slides: AROM;Both;20 reps;Supine Hip ABduction/ADduction: AROM;Both;20 reps;Supine Straight Leg Raises: AAROM;Both;20 reps;Supine    General Comments        Pertinent Vitals/Pain      Home Living                      Prior Function            PT Goals (current goals can now be found in the care plan section) Progress towards PT goals: Progressing toward goals    Frequency  Min 2X/week    PT Plan Current plan remains appropriate    Co-evaluation             End of Session   Activity Tolerance: Patient tolerated treatment well Patient left: in bed;with call bell/phone within reach;with bed alarm set     Time: 1137-1201 PT Time Calculation (min) (ACUTE ONLY): 24 min  Charges:  $Therapeutic Exercise: 23-37 mins                    G Codes:      Charlaine Dalton 09/07/2014, 11:57 AM

## 2014-09-08 ENCOUNTER — Other Ambulatory Visit: Payer: Self-pay | Admitting: *Deleted

## 2014-09-08 DIAGNOSIS — C3491 Malignant neoplasm of unspecified part of right bronchus or lung: Secondary | ICD-10-CM

## 2014-09-08 LAB — CBC WITH DIFFERENTIAL/PLATELET
BASOS ABS: 0 10*3/uL (ref 0–0.1)
Basophils Relative: 0 %
EOS ABS: 0 10*3/uL (ref 0–0.7)
Eosinophils Relative: 0 %
HCT: 30.2 % — ABNORMAL LOW (ref 35.0–47.0)
Hemoglobin: 9.8 g/dL — ABNORMAL LOW (ref 12.0–16.0)
Lymphocytes Relative: 3 %
Lymphs Abs: 0.2 10*3/uL — ABNORMAL LOW (ref 1.0–3.6)
MCH: 27.8 pg (ref 26.0–34.0)
MCHC: 32.4 g/dL (ref 32.0–36.0)
MCV: 85.8 fL (ref 80.0–100.0)
Monocytes Absolute: 0.1 10*3/uL — ABNORMAL LOW (ref 0.2–0.9)
Monocytes Relative: 1 %
NEUTROS PCT: 96 %
Neutro Abs: 7.7 10*3/uL — ABNORMAL HIGH (ref 1.4–6.5)
PLATELETS: 305 10*3/uL (ref 150–440)
RBC: 3.51 MIL/uL — ABNORMAL LOW (ref 3.80–5.20)
RDW: 16.9 % — AB (ref 11.5–14.5)
WBC: 8.1 10*3/uL (ref 3.6–11.0)

## 2014-09-08 LAB — BASIC METABOLIC PANEL
Anion gap: 9 (ref 5–15)
BUN: 26 mg/dL — ABNORMAL HIGH (ref 6–20)
CO2: 35 mmol/L — ABNORMAL HIGH (ref 22–32)
Calcium: 8.9 mg/dL (ref 8.9–10.3)
Chloride: 90 mmol/L — ABNORMAL LOW (ref 101–111)
Creatinine, Ser: 1.17 mg/dL — ABNORMAL HIGH (ref 0.44–1.00)
GFR calc Af Amer: 48 mL/min — ABNORMAL LOW (ref >60)
GFR calc non Af Amer: 42 mL/min — ABNORMAL LOW (ref >60)
GLUCOSE: 142 mg/dL — AB (ref 65–99)
Potassium: 5.5 mmol/L — ABNORMAL HIGH (ref 3.5–5.1)
Sodium: 134 mmol/L — ABNORMAL LOW (ref 135–145)

## 2014-09-08 MED ORDER — GUAIFENESIN-CODEINE 100-10 MG/5ML PO SOLN: 5.0000 mL | Freq: Four times a day (QID) | ORAL | Status: DC | PRN

## 2014-09-08 MED ORDER — ONDANSETRON HCL 4 MG PO TABS: 4.0000 mg | ORAL_TABLET | Freq: Four times a day (QID) | ORAL | Status: DC | PRN

## 2014-09-08 MED ORDER — PREDNISONE 20 MG PO TABS: ORAL_TABLET | ORAL | Status: DC

## 2014-09-08 MED ORDER — FUROSEMIDE 40 MG PO TABS: 40.0000 mg | ORAL_TABLET | Freq: Every day | ORAL | Status: DC

## 2014-09-08 MED ORDER — CEFUROXIME AXETIL 250 MG PO TABS: 250.0000 mg | ORAL_TABLET | Freq: Two times a day (BID) | ORAL | Status: DC

## 2014-09-08 MED ORDER — CLOPIDOGREL BISULFATE 75 MG PO TABS: 75.0000 mg | ORAL_TABLET | Freq: Every day | ORAL | Status: DC

## 2014-09-08 MED ORDER — CYANOCOBALAMIN 100 MCG PO TABS: 100.0000 ug | ORAL_TABLET | Freq: Every day | ORAL | Status: DC

## 2014-09-08 MED ORDER — DILTIAZEM HCL ER COATED BEADS 120 MG PO CP24: 120.0000 mg | ORAL_CAPSULE | Freq: Every day | ORAL | Status: DC

## 2014-09-08 NOTE — Progress Notes (Signed)
MD order to discharge to [facility] via EMS.  Report called to [place] at [facility] and EMS called to transport

## 2014-09-08 NOTE — Progress Notes (Signed)
Spoke with Dr.Hande concerning patient's Potassium of 5.5 and patient on ACEI, enalapril. No new orders received. MD will order to recheck Potassium level at Rehab facility.  Chinita Greenland PharmD Clinical Pharmacist 09/08/2014

## 2014-09-08 NOTE — Progress Notes (Signed)
rx for ondansetron faxed to [facility] facility.

## 2014-09-08 NOTE — Progress Notes (Signed)
PROGRESS NOTE  Jo Frank:[id number] DOB: 18-Apr-1928 DOA: 08/31/2014 PCP: [name], MD  Subjective:  81 y/o f  With hx of HTN, CVA, COPD, Recent diagnosis of Ca lung with mets admitted with progressive shortness of breath, and cough. No improvement with out pt treatment of tapering Prednisone and antibiotics. Underwent Chemo yesterday- states she is doing ok Overall doing better- but still has a persistent cough     Objective: BP 131/60 mmHg  Pulse 92  Temp(Src) 97.7 F (36.5 C) (Oral)  Resp 19  Ht '5\' 1"'$  (1.549 m)  Wt 65.363 kg (144 lb 1.6 oz)  BMI 27.24 kg/m2  SpO2 97%  Intake/Output Summary (Last 24 hours) at 09/08/14 0820 Last data filed at 09/07/14 2121  Gross per 24 hour  Intake    628 ml  Output   1601 ml  Net   -973 ml   Filed Weights   09/06/14 0500 09/07/14 0435 09/08/14 0549  Weight: 66.996 kg (147 lb 11.2 oz) 65.726 kg (144 lb 14.4 oz) 65.363 kg (144 lb 1.6 oz)    Exam: HEENT: [place], AT  General: Not in distress  Cardiovascular: S1 S2  Respiratory: Rhonchi + Bilat  Abdomen: Soft. Non tender  Neuro:Non Focal  Extremities; No  edema   Data Reviewed: Basic Metabolic Panel:  Recent Labs Lab 09/04/14 0443 09/05/14 0440 09/06/14 0425 09/07/14 0518 09/08/14 0547  NA 136 138 137 137 134*  K 3.8 4.2 3.9 4.0 5.5*  CL 92* 94* 92* 93* 90*  CO2 35* 35* 36* 35* 35*  GLUCOSE 108* 109* 101* 94 142*  BUN 29* 34* 30* 26* 26*  CREATININE 1.25* 1.18* 1.21* 1.25* 1.17*  CALCIUM 8.6* 8.9 9.2 9.1 8.9   Liver Function Tests:  Recent Labs Lab 09/06/14 0425 09/07/14 0518  AST 14* 15  ALT 16 14  ALKPHOS 47 50  BILITOT 0.5 0.4  PROT 5.7* 5.6*  ALBUMIN 2.7* 2.7*   No results for input(s): LIPASE, AMYLASE in the last 168 hours. No results for input(s): AMMONIA in the last 168 hours. CBC:  Recent Labs Lab 09/04/14 0443 09/05/14 0440 09/06/14 0425 09/07/14 0518 09/08/14 0547  WBC 10.8 10.9 9.8 12.0* 8.1  NEUTROABS 8.7* 8.9* 8.2*  9.8* 7.7*  HGB 9.2* 9.2* 9.8* 9.9* 9.8*  HCT 28.1* 28.6* 30.4* 31.1* 30.2*  MCV 85.2 85.7 85.2 85.3 85.8  PLT 335 310 324 344 305   Cardiac Enzymes:   No results for input(s): CKTOTAL, CKMB, CKMBINDEX, TROPONINI in the last 168 hours. BNP (last 3 results)  Recent Labs  08/31/14 1651  BNP 733.0*    ProBNP (last 3 results) No results for input(s): PROBNP in the last 8760 hours.  CBG: No results for input(s): GLUCAP in the last 168 hours.  Recent Results (from the past 240 hour(s))  Blood culture (routine x 2)     Status: None   Collection Time: 08/31/14  4:52 PM  Result Value Ref Range Status   Specimen Description BLOOD LEFT ASSIST CONTROL  Final   Special Requests BOTTLES DRAWN AEROBIC AND ANAEROBIC 3ML  Final   Culture NO GROWTH 5 DAYS  Final   Report Status 09/05/2014 FINAL  Final  Blood culture (routine x 2)     Status: None   Collection Time: 08/31/14  4:52 PM  Result Value Ref Range Status   Specimen Description BLOOD RIGHT ASSIST CONTROL  Final   Special Requests BOTTLES DRAWN AEROBIC AND ANAEROBIC 3ML  Final   Culture NO GROWTH  5 DAYS  Final   Report Status 09/05/2014 FINAL  Final  Culture, expectorated sputum-assessment     Status: None   Collection Time: 09/02/14  3:23 PM  Result Value Ref Range Status   Specimen Description EXPECTORATED SPUTUM  Final   Special Requests NONE  Final   Sputum evaluation THIS SPECIMEN IS ACCEPTABLE FOR SPUTUM CULTURE  Final   Report Status 09/02/2014 FINAL  Final  Culture, respiratory (NON-Expectorated)     Status: None   Collection Time: 09/02/14  3:23 PM  Result Value Ref Range Status   Specimen Description EXPECTORATED SPUTUM  Final   Special Requests NONE Reflexed from [id number]  Final   Gram Stain   Final    GOOD SPECIMEN - 80-90% WBCS MODERATE WBC SEEN MODERATE GRAM POSITIVE COCCI IN CLUSTERS IN PAIRS FEW GRAM NEGATIVE RODS FEW GRAM NEGATIVE COCCOBACILLI    Culture APPEARS TO BE NORMAL FLORA  Final   Report Status  09/05/2014 FINAL  Final     Studies: No results found.  Scheduled Meds: . budesonide-formoterol  2 puff Inhalation BID  . cefTRIAXone (ROCEPHIN)  IV  1 g Intravenous Q24H  . clopidogrel  75 mg Oral Q breakfast  . diltiazem  120 mg Oral Daily  . enalapril  20 mg Oral Daily  . furosemide  40 mg Oral Daily  . heparin  5,000 Units Subcutaneous 3 times per day  . ipratropium-albuterol  3 mL Nebulization QID  . pantoprazole  40 mg Oral Daily  . predniSONE  20 mg Oral Q breakfast  . vitamin B-12  100 mcg Oral Daily    Continuous Infusions:   Assessment/Plan: 1 Acute Shortness of breath secondary to COPD and Bronchitis On  IV Rocephin and Prednisone ECHO : EF 60-65% CXR : Rt upper lobe mass lesion with improved aeration Rt base  Continue SVN's, Symbicort  and O2  2 Ca Lung with Liver mets: Started chemo yesterday 3 Paroxysmal A-fib: On Cardizem 4 HTN: On Enalapril- Continue to monitor 5 Hx of CVA -On Plavix  6 Physical Therapy- For Rehab  today    Code Status: Full  Family Communication: Grand daughter      [name]   09/08/2014, 8:20 AM  LOS: 8 days

## 2014-09-08 NOTE — Progress Notes (Signed)
[place] @ [facility] [phone]) [phone]  Fax:([phone]) [place] OB: May 24, 1928  MR#: [id number]  ION#:[id number]  Patient Care Team: [name], MD as PCP - General (Internal Medicine)  CHIEF COMPLAINT:  Chief Complaint  Patient presents with  . Shortness of Breath    VISIT DIAGNOSIS:     ICD-9-CM ICD-10-CM   1. COPD exacerbation 491.21 J44.1   2. Acute exacerbation of CHF (congestive heart failure) 428.0 I50.9   3. NSTEMI (non-ST elevated myocardial infarction) 410.70 I21.4   4. CHF (congestive heart failure) 428.0 I50.9 DG Chest Port 1 View     DG Chest Port 1 View     AMB referral to CHF clinic     CANCELED: DG Chest 2 View     CANCELED: DG Chest 2 View     CANCELED: DG Chest 2 View     CANCELED: DG Chest 2 View  5. Dyspnea 786.09 R06.00 DG Chest 1 View     DG Chest 1 View     CANCELED: DG Chest 1 View     CANCELED: DG Chest 1 View  6. Shortness of breath 786.05 R06.02 DG Chest [facility] 1 View     DG Chest [facility] 1 View  7. Cough 786.2 R05 DG Chest 1 View     DG Chest 1 View  8. Metastatic lung cancer (metastasis from lung to other site), right 162.9 C34.91 PACLitaxel (TAXOL) 168 mg in dextrose 5 % 250 mL chemo infusion (> 52m/m2)   199.1 C79.9 palonosetron (ALOXI) injection 0.25 mg     fosaprepitant (EMEND) 150 mg, dexamethasone (DECADRON) 12 mg in sodium chloride 0.9 % 145 mL IVPB     diphenhydrAMINE (BENADRYL) injection 12.5 mg     famotidine (PEPCID) IVPB 20 mg premix     ondansetron (ZOFRAN) 8 mg in sodium chloride 0.9 % 50 mL IVPB     CARBOplatin (PARAPLATIN) 310 mg in sodium chloride 0.9 % 100 mL chemo infusion     DISCONTINUED: CARBOplatin (PARAPLATIN) 310 mg in sodium chloride 0.9 % 100 mL chemo infusion     Oncology History   1.  Diagnosis of right breast cancer T1 cN0 M0 tumor triple negative disease status post partial mastectomy in September 2013.  Patient underwent lumpectomy radiation therapy there is no record of  evaluation by oncologist.  2pT1c pN0 (sn) cM0 triple negative grade 2 invasive carcinoma of the right breast lumpectomy and sentinel node study on 10/11/2011. Tumor size 1.8 cm, grade 2, margins uninvolved by invasive carcinoma.  DCIS present, closest margin is 5 mm. 2 sentinel lymph nodes negative. ER negative (0%), PR negative (0%), HER-2/neu FISH negative (HER2/CEP17 ratio 1.16). 3.  CT scan of the chest revealed multiple lung nodules and the one nodule in July of . 4.  Liver biopsy is consistent with adenocarcinoma most likely lung cancer versus cholangiocarcinoma breast cancer has been ruled out (July, 2016)     Breast CA   10/09/2011 Initial Diagnosis Breast CA stage IC triple negative disease status post lumpectomy and radiation therapy    Oncology Flowsheet 09/01/2014 09/02/2014 09/03/2014 09/04/2014 09/05/2014 09/06/2014 09/07/2014  CARBOplatin (PARAPLATIN) IV - - - - - - 310 mg  dexamethasone (DECADRON) IV - - - - - - [ 12 mg ]  fosaprepitant (EMEND) IV - - - - - - [ 150 mg ]  LORazepam (ATIVAN) IV 0.5 mg - - - - - -  methylPREDNISolone sodium succinate 125 mg/2 mL (SOLU-MEDROL) IV - - - - - - -  ondansetron (ZOFRAN) IV - - - - - - -  PACLitaxel (TAXOL) IV - - - - - - 168 mg  palonosetron (ALOXI) IV - - - - - - 0.25 mg  predniSONE (DELTASONE) PO 20 _0  mg 20 mg    INTERVAL HISTORY:  81 year old lady started feeling weak and tired.  Was evaluated by primary care physician initially she received a course of prednisone and antibiotic without much relief than patient was evaluated had a chest x-ray followed by CT scan.  CT scan revealed right upper lobe lung mass multiple lung nodules and a liver nodule.  Patient had a previous history of node-negative carcinoma of breast. Feeling weak and tired.  No hemoptysis no chest pain no bony pains. Patient was admitted in the hospital feeling somewhat better. Dry hacking cough.  September 07, 2014 Patient is going to get  chemotherapy with carboplatinum and Taxol Patient is also on oxygen.  Blood pressure was slightly low.  Patient and number of questions regarding chemotherapy which were answered REVIEW OF STEMS:    general status: Patient is feeling weak and tired.  No change in a performance status.  No chills.  No fever.  HEENT  No evidence of stomatitis Lungs: Dry hacking cough.  Increasing shortness of breath Patient is acutely short of breath started early this morning.  No chills.  No fever. Cardiac: No chest pain or paroxysmal nocturnal dyspnea GI: No nausea no vomiting no diarrhea no abdominal pain Poor appetite Skin: No rash Lower extremity no swelling Neurological system: No tingling.  No numbness.  No other focal signs Musculoskeletal system no bony pains  GU: No hematuria.  No dysuria. Extremely depressed.  As per HPI. Otherwise, a complete review of systems is negatve.  PAST MEDICAL HISTORY: Past Medical History  Diagnosis Date  . Hypertension   . CVA (cerebral infarction) 2010    Was on Plavix for 2 years, then switched to clopidogrel but d/c due to side effects ("sick")  . Nephrolithiasis     per patient, passes on her own  . Stroke   . COPD (chronic obstructive pulmonary disease)   . GERD (gastroesophageal reflux disease)   . Breast cancer 2013    breast cancer right-radiation-lumpectomy  . Lung cancer     PAST SURGICAL HISTORY: Past Surgical History  Procedure Laterality Date  . Breast lumpectomy      left side  . Abdominal hysterectomy  1960s    "bleeding profusely"  . Cholecystectomy    . Hand surgery      s/p fall    FAMILY HISTORY Family History  Problem Relation Age of Onset  . Throat cancer Mother     heavy smoker & drinker, died at 79 yo (1978)  . Heart failure Father   . Hypertension Sister   . Stroke Sister          ADVANCED DIRECTIVES:  Patient does have advance healthcare directive, Patient   does not desire to make any changes  HEALTH  MAINTENANCE: Social History  Substance Use Topics  . Smoking status: Former Smoker -- 0.50 packs/day for 30 years    Types: Cigarettes    Quit date: 12/07/1978  . Smokeless tobacco: Never Used  . Alcohol Use: No     Allergies  Allergen Reactions  . Aspirin Shortness Of Breath, Swelling and Other (See Comments)    Pt states that she vomits blood.   Marland Kitchen Penicillins Anaphylaxis  .  Nitrofuran Derivatives Nausea And Vomiting    Current Facility-Administered Medications  Medication Dose Route Frequency Provider Last Rate Last Dose  . budesonide-formoterol (SYMBICORT) 160-4.5 MCG/ACT inhaler 2 puff  2 puff Inhalation BID Lance Coon, MD   2 puff at 09/07/14 2148  . cefTRIAXone (ROCEPHIN) 1 g in dextrose 5 % 50 mL IVPB  1 g Intravenous Q24H [name], MD   1 g at 09/07/14 0812  . clopidogrel (PLAVIX) tablet 75 mg  75 mg Oral Q breakfast Vaughan Basta, MD   75 mg at 09/07/14 1610  . diltiazem (CARDIZEM CD) 24 hr capsule 120 mg  120 mg Oral Daily Vishwanath Hande, MD   120 mg at 09/07/14 0811  . enalapril (VASOTEC) tablet 20 mg  20 mg Oral Daily Vaughan Basta, MD   20 mg at 09/07/14 9604  . furosemide (LASIX) tablet 40 mg  40 mg Oral Daily [name], MD   40 mg at 09/07/14 5409  . guaiFENesin-codeine 100-10 MG/5ML solution 5 mL  5 mL Oral Q6H PRN Vishwanath Hande, MD   5 mL at 09/05/14 2135  . heparin injection 5,000 Units  5,000 Units Subcutaneous 3 times per day Vaughan Basta, MD   5,000 Units at 09/08/14 0527  . ipratropium-albuterol (DUONEB) 0.5-2.5 (3) MG/3ML nebulizer solution 3 mL  3 mL Nebulization QID [name], MD   3 mL at 09/08/14 0731  . ondansetron (ZOFRAN) 8 mg in sodium chloride 0.9 % 50 mL IVPB  8 mg Intravenous Q6H PRN Forest Gleason, MD      . pantoprazole (PROTONIX) EC tablet 40 mg  40 mg Oral Daily Vaughan Basta, MD   40 mg at 09/07/14 0811  . predniSONE (DELTASONE) tablet 20 mg  20 mg Oral Q breakfast Vaughan Basta,  MD   20 mg at 09/07/14 8119  . vitamin B-12 (CYANOCOBALAMIN) tablet 100 mcg  100 mcg Oral Daily Vaughan Basta, MD   100 mcg at 09/07/14 0815    OBJECTIVE: PHYSICAL EXAM:has not lost sig General  status: Performance status is good.  Patient is feeling weak and tired.  Poor appetite.  Very depressed. HEENT: No evidence of stomatitis. Sclera and conjunctivae :: No jaundice.   pale looking. Lungs: bilateral  rhonchi.  Lymphatic system: Cervical, axillary, inguinal, lymph nodes not palpable Lateral rhonchi.  Diminished air entry on both sides. GI: Abdomen is soft.  No ascites.  Liver spleen not palpable.  No tenderness.  Bowel sounds are within normal limit Lower extremity: No edema Neurological system: Higher functions, cranial nerves intact no evidence of peripheral neuropathy. Skin: No rash.  No ecchymosis.Danley Danker Vitals:   09/08/14 0549  BP: 131/60  Pulse: 92  Temp: 97.7 F (36.5 C)  Resp:      Body mass index is 27.24 kg/(m^2).    ECOG FS:1 - Symptomatic but completely ambulatory  LAB RESULTS:  Admission on 08/31/2014  No results displayed because visit has over 200 results.       STUDIES: Ct Abdomen Wo Contrast  2014/08/19   CLINICAL DATA:  Acute upper abdominal pain after hepatic biopsy.  EXAM: CT ABDOMEN WITHOUT CONTRAST  TECHNIQUE: Multidetector CT imaging of the abdomen was performed following the standard protocol without IV contrast.  COMPARISON:  CT scan of same day.  FINDINGS: Status post cholecystectomy. No pneumothorax is noted in visualized lung bases. Hypodense mass is noted in right hepatic lobe with Gel-Foam surrounding it consistent with recent percutaneous biopsy. There does appear to be a small subcapsular  hematoma seen anteriorly over the right hepatic lobe. No other hemorrhage is noted. Atherosclerosis of abdominal aorta is noted without aneurysm formation. Large left renal cyst is again noted. The spleen and pancreas appear normal. Right renal cysts are  stable. There is no evidence of bowel obstruction.  IMPRESSION: Right hepatic lesion is again noted which was the object of biopsy today. Gel-Foam is noted in and surrounding the lesion. This is expected finding status post biopsy. Small subcapsular hematoma is seen along the anterior portion of the right hepatic lobe.   Electronically Signed   By: Marijo Conception, M.D.   On: 08/12/2014 12:59   Dg Chest 1 View  09/06/2014   CLINICAL DATA:  Known history of lung carcinoma with shortness of Breath  EXAM: CHEST  1 VIEW  COMPARISON:  09/01/2014  FINDINGS: Persistent right upper lobe mass lesion is identified. Improved aeration is noted in the right lung base left lung remains clear. The cardiac shadow is stable. No acute bony abnormality is noted.  IMPRESSION: Stable right upper lobe mass lesion. Improved aeration in the right lung base is noted.   Electronically Signed   By: Inez Catalina M.D.   On: 09/06/2014 13:38   Dg Chest 1 View  09/01/2014   CLINICAL DATA:  Acute onset of dyspnea.  Initial encounter.  EXAM: CHEST  1 VIEW  COMPARISON:  Chest radiograph performed 08/31/2014  FINDINGS: Bilateral central airspace opacification is perhaps slightly worsened from the prior study and concerning for pulmonary edema. Small bilateral pleural effusions are noted. No pneumothorax is seen.  The cardiomediastinal silhouette is borderline enlarged. No acute osseous abnormalities are identified.  IMPRESSION: Slightly worsening pulmonary edema noted, with small bilateral pleural effusions. Borderline cardiomegaly.   Electronically Signed   By: Garald Balding M.D.   On: 09/01/2014 01:33   Ct Angio Chest Pe W/cm &/or Wo Cm  08/18/2014   CLINICAL DATA:  LEFT chest pain with cough starting this morning, history of lung cancer, RIGHT breast cancer, hypertension, stroke, former smoker  EXAM: CT ANGIOGRAPHY CHEST WITH CONTRAST  TECHNIQUE: Multidetector CT imaging of the chest was performed using the standard protocol during bolus  administration of intravenous contrast. Multiplanar CT image reconstructions and MIPs were obtained to evaluate the vascular anatomy.  CONTRAST:  79m OMNIPAQUE IOHEXOL 350 MG/ML SOLN IV  COMPARISON:  07/29/2014  FINDINGS: Scattered atherosclerotic calcifications aorta, proximal great vessels, and coronary arteries.  No aortic aneurysm or dissection.  Pulmonary arteries well opacified with minimal dilatation of central pulmonary arteries.  Pulmonary arteries patent without evidence of pulmonary embolism.  Large cyst upper pole LEFT kidney 11.3 x 9.8 cm.  Multiple small RIGHT renal cyst.  Moderate-sized hiatal hernia.  Vague area of low attenuation lateral RIGHT lobe liver 3.3 cm greatest size corresponding to liver lesion which was previously biopsied.  Remaining visualized portions of upper abdomen unremarkable.  Enlarged RIGHT paratracheal lymph node 14 mm short axis image 35 previously 12 mm.  Enlarged precarinal lymph node 13 mm short axis image 47 unchanged.  Adenopathy at superior aspect of RIGHT pulmonary hilum unchanged, 14 mm short axis image 47.  RIGHT upper lobe mass anteriorly compatible with neoplasm, appears larger than on previous exam though this could be overestimated due to adjacent atelectasis, currently measuring 3.6 x 2.9 cm previously 3.0 x 2.9 cm.  Focus of infiltrate versus non solid opacity in RIGHT upper lobe more inferolaterally appears stable.  Additional stellate semi solid opacity in RIGHT lower lobe 2.5 cm  diameter image 62 little changed.  Patchy sub solid, solid, and nodular infiltrates in RIGHT lower lobe again identified.  Subtle areas of non solid opacity in the LEFT lung are also again identified, grossly similar to previous exam no additional discrete mass or nodule identified.  Tiny RIGHT pleural effusion.  No pneumothorax.  Thoracolumbar dextro convex scoliosis with rotatory component.  Osseous demineralization.  Review of the MIP images confirms the above findings.   IMPRESSION: No evidence of pulmonary embolism.  Mild dilatation of the central pulmonary arteries raising question of pulmonary arterial hypertension.  RIGHT upper lobe mass compatible with neoplasm question minimally larger than on previous exam versus overestimation due to adjacent atelectasis which appears increased.  2.5 cm diameter stellate semi solid opacity RIGHT lower lobe question additional tumor.  Multiple additional solid, semi solid and non solid opacities in both lungs may represent inflammatory process though additional tumor not completely excluded, recommend attention on follow-up exams ; some with the more nodular appearing opacities in the RIGHT lower lobe have decreased in size since the previous exam.  Renal cysts including an 11.3 cm cyst at the upper pole of the LEFT kidney.  Small hiatal hernia.   Electronically Signed   By: Lavonia Dana M.D.   On: 08/18/2014 17:26   Nm Pet Image Initial (pi) Skull Base To Thigh  08/15/2014   CLINICAL DATA:  Subsequent treatment strategy for liver lesion biopsied on 08/12/2014. History of right breast cancer in 2013 with last radiation therapy in 2014.  EXAM: NUCLEAR MEDICINE PET SKULL BASE TO THIGH  TECHNIQUE: 12.42 mCi F-18 FDG was injected intravenously. Full-ring PET imaging was performed from the skull base to thigh after the radiotracer. CT data was obtained and used for attenuation correction and anatomic localization.  FASTING BLOOD GLUCOSE:  Value: 79 mg/dl  COMPARISON:  Abdominal CT 08/12/2014.  Chest CT 07/29/2014.  FINDINGS: NECK  There are 2 adjacent left supraclavicular lymph nodes which are hypermetabolic. The larger of these measures 10 mm short axis on image 66 and has an SUV max of 4.1. No other hypermetabolic cervical lymph nodes demonstrated.There are no lesions of the pharyngeal mucosal space.  CHEST  The enlarged right paratracheal and right hilar lymph nodes on recent CT are hypermetabolic. The largest right paratracheal node measures  1.7 cm short axis on image 90 and has an SUV max of 10.0. No hypermetabolic subcarinal, left hilar or axillary adenopathy. The dominant spiculated right upper lobe mass is now partly obscured by atelectasis, but measures approximately 3.3 x 3.1 cm on image 84 and is hypermetabolic with an SUV max of 15.9. The other smaller ground-glass opacities in the right upper lobe (image 84) and in the superior segment of the right lower lobe (image 96) also demonstrate low-level hypermetabolic activity (SUV max 2.1). There is a 9 mm focal ground-glass opacity in the left upper lobe on image 84 which also demonstrates low-level activity. There is increased patchy airspace opacity dependently in the right lower lobe with associated low level metabolic activity, likely inflammatory.  ABDOMEN/PELVIS  The recently biopsied dominant lesion in the right hepatic lobe is hypermetabolic. This lesion measures approximately 3.5 cm on image 128 and has an SUV max of 10.6. There are at least 2 smaller adjacent hypermetabolic metastases within the right hepatic lobe. There is no suspicious activity within the spleen, pancreas, adrenal glands or kidneys. There is low-level hypermetabolic activity within a single lymph node in the porta hepatis (SUV max 4.0). There is no  other hypermetabolic nodal activity. There are low-density adnexal lesions bilaterally status post hysterectomy without associated abnormal metabolic activity. There are multiple low-density renal lesions, including a dominant left renal cyst without suspicious metabolic activity. Nonobstructing calculus noted in the upper pole of the right kidney, diffuse atherosclerosis and sigmoid diverticulosis noted.  SKELETON  There is a single hypermetabolic osseous metastasis within the right ischium. This is lytic and has an SUV max of 10.2. Low-level activity anteriorly at C1-2 is likely inflammatory. There is a moderate thoracolumbar scoliosis.  IMPRESSION: 1. The dominant right  upper lobe pulmonary mass is hypermetabolic, most consistent with bronchogenic carcinoma. There are associated left supraclavicular, right hilar and mediastinal nodal metastases, at least 3 hepatic metastases, and a right ischial nodal metastasis. 2. There are additional ground-glass opacities in both lungs which may represent synchronous adenocarcinoma. 3. Increased patchy dependent opacity in the right lower lobe, likely inflammatory.   Electronically Signed   By: Richardean Sale M.D.   On: 08/15/2014 12:23   Ct Biopsy  08/12/2014   CLINICAL DATA:  Right hepatic mass.  EXAM: CT GUIDED core BIOPSY OF right hepatic lobe mass.  ANESTHESIA/SEDATION: 1.5  Mg IV Versed; 100 mcg IV Fentanyl  Total Moderate Sedation Time: 40 minutes.  PROCEDURE: The procedure risks, benefits, and alternatives were explained to the patient, including pneumothorax, bleeding and infection. Questions regarding the procedure were encouraged and answered. The patient understands and consents to the procedure.  The right upper quadrant of the abdomen was prepped with chlorhexidinein a sterile fashion, and a sterile drape was applied covering the operative field. Sterile gloves were used for the procedure. Local anesthesia was provided with 1% Lidocaine.  Under CT guidance, 17 gauge guiding needle was directed toward lesion in superior and posterior portion of right hepatic lobe. Four core samples were obtained using 18 gauge biopsy needle. Needle was then removed with the simultaneous injection of Gel-Foam slurry. Appropriate dressing was applied.  Complications: None immediate noted on post biopsy imaging.  FINDINGS: Right hepatic mass as described on prior studies.  IMPRESSION: Under CT guidance, percutaneous core biopsy was performed of right hepatic lobe lesion concerning for metastasis.   Electronically Signed   By: Marijo Conception, M.D.   On: 08/12/2014 11:56   Dg Chest Port 1 View  09/01/2014   CLINICAL DATA:  History of hypertension  stroke COPD gastroesophageal reflux and breast cancer, recently diagnosed with metastatic lung cancer involving the liver for the past 2-3 weeks with progressive worsening shortness of breath over that time  EXAM: PORTABLE CHEST - 1 VIEW  COMPARISON:  09/01/2014 at 1:18  FINDINGS: Stable mild cardiac enlargement with central vascular congestion. Extensive bilateral perihilar hazy opacities. Small pleural effusions. Interstitial prominence.  IMPRESSION: Congestive heart failure with moderately severe pulmonary edema stable from prior study   Electronically Signed   By: Skipper Cliche M.D.   On: 09/01/2014 08:27   Dg Chest Portable 1 View  08/31/2014   CLINICAL DATA:  Progressive shortness of breath. History of lung cancer. Liver cancer. Breast cancer. COPD.  EXAM: PORTABLE CHEST - 1 VIEW  COMPARISON:  CT of the chest 08/18/2014.  FINDINGS: The heart is enlarged. Mild edema is present. Bilateral effusions are noted. Bibasilar airspace disease is evident. Nodular disease in the right upper lobe is partially obscured by edema.  IMPRESSION: 1. Cardiomegaly with mild edema and bilateral effusions compatible with congestive heart failure. 2. Right upper lobe nodule.   Electronically Signed   By: Harrell Gave  Mattern M.D.   On: 08/31/2014 17:14    ASSESSMENT: CT scan of the chest and abdomen revealed multiple lung nodule and liver nodule. In September 2013 patient had a right breast cancer triple negative disease T1 cN0 M0 tumor status post resection and radiation therapy in no further adjuvant chemotherapy PET scan has been reviewed Tissue of origin  study revealed that this is more likely to be lung cancer than any other primary Guident testing is negative for any positive tumor marker We will proceed with chemotherapy with carboplatinum and split dose of Taxol Patient can be discharged in next 24-48 hours depending on her reaction to chemotherapy   Patient  is planning to go for rehabilitation    PLAN:     Patient tolerated chemotherapy very well..   Plan to discharge patient to rehabilitation Discharge medications would include Zofran 4 mg 4 times a day when necessary for nausea Prilosec or protonix  Prednisone in tapering dose over the next week I will make arrangements for her to see me in 2-3 weeks CBC next week   Breast CA   Staging form: Breast, AJCC 7th Edition     Clinical: Stage IA (T1c, N0, M0) - Marni Griffon, MD   09/08/2014 8:21 AM

## 2014-09-08 NOTE — Discharge Summary (Signed)
Physician Discharge Summary  Jo Frank TKW:[id number] DOB: 1928/04/16 DOA: 08/31/2014  PCP: [name], MD  Admit date: 08/31/2014 Discharge date: 09/08/2014  Time spent: 40 minutes  Recommendations for Outpatient Follow-up:  D/c to Skilled Nursiing Follow up with Dr. Oliva Bustard and Dr. Ginette Pitman Call office to make appt in 1-2 weeks    Discharge Diagnoses:  1 Acute Shortness of breath secondary to COPD Exacerbation and Bronchitis. 2 Ca Lung with Liver  metastases 3 HTN 4 Paroxysmal A-fib   Discharge Condition: Stable  Diet recommendation: 2 gm sodium  Filed Weights   09/06/14 0500 09/07/14 0435 09/08/14 0549  Weight: 66.996 kg (147 lb 11.2 oz) 65.726 kg (144 lb 14.4 oz) 65.363 kg (144 lb 1.6 oz)    History of present illness:  No less Schmutz is a 81 year old female is treated for COPD recently diagnosed lung cancer with liver metastases, history of Hypertension, History of previous CVA, GERD  Presented  to the ED complaining of worsening shortness of breath. Patient was recently treated with a course of Prednisone taper and Azithromycin but continued to be symptomatic. Initial chest x-ray showed evidence for bilateral pleural effusions and and also right upper lobe nodule  Hospital Course:  Patient was admitted [facility] and received IV Lasix. An echocardiogram showed  EF 65% with moderate mitral regurgitation. Patient subsequently developed Atrial fibrillation with rapid ventricular rate. She was seen by cardiologist Dr. Clayborn Bigness She was also started on by mouth Cardizem. Patient was also treated with intravenous Levaquin was subsequently switched to IV Rocephin. She was seen in consultation by Dr. Oliva Bustard who recommended that we start chemotherapy for her lung cancer.  Patient did receive 1 cycle of chemotherapy on the 09/07/2014 Patient's cough improved to a significant extent. Was seen by physical therapy and felt that she would benefit from  skilled nursing. Patient was advised to have a metabolic panel and potassium level checked so. She is discharged on Ceftin 250 g by mouth twice a day for 10 days and a tapering schedule of prednisone She has been advised to follow-up with Dr. Oliva Bustard  as an outpatient for further chemotherapy and also follow me Dr. Ginette Pitman in 1- 2 weeks' time..    Consultations: Dr. Oliva Bustard- Hematology Oncology. Dr. Clayborn Bigness- Cardiology  Discharge Exam: Filed Vitals:   09/08/14 1027  BP: 102/55  Pulse: 97  Temp:   Resp:     General: NAD Cardiovascular: S1 S2 Respiratory: Rhonchi + bilat  Discharge Instructions   Discharge Instructions    AMB referral to CHF clinic    Complete by:  As directed      Diet - low sodium heart healthy    Complete by:  As directed      Discharge instructions    Complete by:  As directed   Keep follow up with Dr. Oliva Bustard at the [place]  Call office to make appt in 1-2 weeks          Current Discharge Medication List    START taking these medications   Details  cefUROXime (CEFTIN) 250 MG tablet Take 1 tablet (250 mg total) by mouth 2 (two) times daily with a meal. Qty: 20 tablet, Refills: 0    diltiazem (CARDIZEM CD) 120 MG 24 hr capsule Take 1 capsule (120 mg total) by mouth daily. Qty: 30 capsule, Refills: 5    furosemide (LASIX) 40 MG tablet Take 1 tablet (40 mg total) by mouth daily. Qty: 30 tablet, Refills: 3    guaiFENesin-codeine  100-10 MG/5ML syrup Take 5 mLs by mouth every 6 (six) hours as needed for cough. Qty: 120 mL, Refills: 0      CONTINUE these medications which have CHANGED   Details  clopidogrel (PLAVIX) 75 MG tablet Take 1 tablet (75 mg total) by mouth daily with breakfast. Qty: 30 tablet, Refills: 3    predniSONE (DELTASONE) 20 MG tablet Prednisone 10 mg tabs  Take 2 tabs po once daily for 3 days Take 1 tab po once daily for 3 days And then stop  Total: 18 tablets, Qty: 9 tablet, Refills: 0    vitamin B-12 100 MCG tablet  Take 1 tablet (100 mcg total) by mouth daily. Qty: 30 tablet, Refills: 3      CONTINUE these medications which have NOT CHANGED   Details  acetaminophen (TYLENOL) 500 MG tablet Take 1,000 mg by mouth every 6 (six) hours as needed for mild pain or headache.     albuterol (PROVENTIL HFA;VENTOLIN HFA) 108 (90 BASE) MCG/ACT inhaler Inhale 1-2 puffs into the lungs every 4 (four) hours as needed for wheezing or shortness of breath. Qty: 1 Inhaler, Refills: 3   Associated Diagnoses: Shortness of breath    budesonide-formoterol (SYMBICORT) 160-4.5 MCG/ACT inhaler Inhale 2 puffs into the lungs 2 (two) times daily.    esomeprazole (NEXIUM) 20 MG capsule Take 20 mg by mouth daily.     ipratropium-albuterol (DUONEB) 0.5-2.5 (3) MG/3ML SOLN Take 3 mLs by nebulization 3 (three) times daily as needed. Dx: chronic COPD Qty: 360 mL, Refills: 3   Associated Diagnoses: Liver mass; SOB (shortness of breath); Chronic obstructive pulmonary disease with acute exacerbation    losartan (COZAAR) 50 MG tablet Take 50 mg by mouth daily.    Oxymetazoline HCl ([employer] NASAL SPRAY NA) Place 1 spray into the nose at bedtime as needed (for congestion).       STOP taking these medications     benzonatate (TESSALON) 100 MG capsule      ondansetron (ZOFRAN) 4 MG tablet      enalapril (VASOTEC) 20 MG tablet        Allergies  Allergen Reactions  . Aspirin Shortness Of Breath, Swelling and Other (See Comments)    Pt states that she vomits blood.   Marland Kitchen Penicillins Anaphylaxis  . Nitrofuran Derivatives Nausea And Vomiting   Follow-up Information    Follow up with Forest Gleason, MD On Oct 18, 2014.   Specialty:  Oncology   Why:  8:15 am for lab/treatment   Contact information:   [address] [place] [address] [phone]        The results of significant diagnostics from this hospitalization (including imaging, microbiology, ancillary and laboratory) are listed below for reference.     Significant Diagnostic Studies: Ct Abdomen Wo Contrast  08-31-14   CLINICAL DATA:  Acute upper abdominal pain after hepatic biopsy.  EXAM: CT ABDOMEN WITHOUT CONTRAST  TECHNIQUE: Multidetector CT imaging of the abdomen was performed following the standard protocol without IV contrast.  COMPARISON:  CT scan of same day.  FINDINGS: Status post cholecystectomy. No pneumothorax is noted in visualized lung bases. Hypodense mass is noted in right hepatic lobe with Gel-Foam surrounding it consistent with recent percutaneous biopsy. There does appear to be a small subcapsular hematoma seen anteriorly over the right hepatic lobe. No other hemorrhage is noted. Atherosclerosis of abdominal aorta is noted without aneurysm formation. Large left renal cyst is again noted. The spleen and pancreas appear normal. Right renal cysts  are stable. There is no evidence of bowel obstruction.  IMPRESSION: Right hepatic lesion is again noted which was the object of biopsy today. Gel-Foam is noted in and surrounding the lesion. This is expected finding status post biopsy. Small subcapsular hematoma is seen along the anterior portion of the right hepatic lobe.   Electronically Signed   By: Marijo Conception, M.D.   On: 08/12/2014 12:59   Dg Chest 1 View  09/06/2014   CLINICAL DATA:  Known history of lung carcinoma with shortness of Breath  EXAM: CHEST  1 VIEW  COMPARISON:  09/01/2014  FINDINGS: Persistent right upper lobe mass lesion is identified. Improved aeration is noted in the right lung base left lung remains clear. The cardiac shadow is stable. No acute bony abnormality is noted.  IMPRESSION: Stable right upper lobe mass lesion. Improved aeration in the right lung base is noted.   Electronically Signed   By: Inez Catalina M.D.   On: 09/06/2014 13:38   Dg Chest 1 View  09/01/2014   CLINICAL DATA:  Acute onset of dyspnea.  Initial encounter.  EXAM: CHEST  1 VIEW  COMPARISON:  Chest radiograph performed 08/31/2014  FINDINGS:  Bilateral central airspace opacification is perhaps slightly worsened from the prior study and concerning for pulmonary edema. Small bilateral pleural effusions are noted. No pneumothorax is seen.  The cardiomediastinal silhouette is borderline enlarged. No acute osseous abnormalities are identified.  IMPRESSION: Slightly worsening pulmonary edema noted, with small bilateral pleural effusions. Borderline cardiomegaly.   Electronically Signed   By: Garald Balding M.D.   On: 09/01/2014 01:33   Ct Angio Chest Pe W/cm &/or Wo Cm  08/18/2014   CLINICAL DATA:  LEFT chest pain with cough starting this morning, history of lung cancer, RIGHT breast cancer, hypertension, stroke, former smoker  EXAM: CT ANGIOGRAPHY CHEST WITH CONTRAST  TECHNIQUE: Multidetector CT imaging of the chest was performed using the standard protocol during bolus administration of intravenous contrast. Multiplanar CT image reconstructions and MIPs were obtained to evaluate the vascular anatomy.  CONTRAST:  97m OMNIPAQUE IOHEXOL 350 MG/ML SOLN IV  COMPARISON:  07/29/2014  FINDINGS: Scattered atherosclerotic calcifications aorta, proximal great vessels, and coronary arteries.  No aortic aneurysm or dissection.  Pulmonary arteries well opacified with minimal dilatation of central pulmonary arteries.  Pulmonary arteries patent without evidence of pulmonary embolism.  Large cyst upper pole LEFT kidney 11.3 x 9.8 cm.  Multiple small RIGHT renal cyst.  Moderate-sized hiatal hernia.  Vague area of low attenuation lateral RIGHT lobe liver 3.3 cm greatest size corresponding to liver lesion which was previously biopsied.  Remaining visualized portions of upper abdomen unremarkable.  Enlarged RIGHT paratracheal lymph node 14 mm short axis image 35 previously 12 mm.  Enlarged precarinal lymph node 13 mm short axis image 47 unchanged.  Adenopathy at superior aspect of RIGHT pulmonary hilum unchanged, 14 mm short axis image 47.  RIGHT upper lobe mass anteriorly  compatible with neoplasm, appears larger than on previous exam though this could be overestimated due to adjacent atelectasis, currently measuring 3.6 x 2.9 cm previously 3.0 x 2.9 cm.  Focus of infiltrate versus non solid opacity in RIGHT upper lobe more inferolaterally appears stable.  Additional stellate semi solid opacity in RIGHT lower lobe 2.5 cm diameter image 62 little changed.  Patchy sub solid, solid, and nodular infiltrates in RIGHT lower lobe again identified.  Subtle areas of non solid opacity in the LEFT lung are also again identified, grossly similar to previous  exam no additional discrete mass or nodule identified.  Tiny RIGHT pleural effusion.  No pneumothorax.  Thoracolumbar dextro convex scoliosis with rotatory component.  Osseous demineralization.  Review of the MIP images confirms the above findings.  IMPRESSION: No evidence of pulmonary embolism.  Mild dilatation of the central pulmonary arteries raising question of pulmonary arterial hypertension.  RIGHT upper lobe mass compatible with neoplasm question minimally larger than on previous exam versus overestimation due to adjacent atelectasis which appears increased.  2.5 cm diameter stellate semi solid opacity RIGHT lower lobe question additional tumor.  Multiple additional solid, semi solid and non solid opacities in both lungs may represent inflammatory process though additional tumor not completely excluded, recommend attention on follow-up exams ; some with the more nodular appearing opacities in the RIGHT lower lobe have decreased in size since the previous exam.  Renal cysts including an 11.3 cm cyst at the upper pole of the LEFT kidney.  Small hiatal hernia.   Electronically Signed   By: Lavonia Dana M.D.   On: 08/18/2014 17:26   Nm Pet Image Initial (pi) Skull Base To Thigh  08/15/2014   CLINICAL DATA:  Subsequent treatment strategy for liver lesion biopsied on 08/12/2014. History of right breast cancer in 2013 with last radiation  therapy in 2014.  EXAM: NUCLEAR MEDICINE PET SKULL BASE TO THIGH  TECHNIQUE: 12.42 mCi F-18 FDG was injected intravenously. Full-ring PET imaging was performed from the skull base to thigh after the radiotracer. CT data was obtained and used for attenuation correction and anatomic localization.  FASTING BLOOD GLUCOSE:  Value: 79 mg/dl  COMPARISON:  Abdominal CT 08/12/2014.  Chest CT 07/29/2014.  FINDINGS: NECK  There are 2 adjacent left supraclavicular lymph nodes which are hypermetabolic. The larger of these measures 10 mm short axis on image 66 and has an SUV max of 4.1. No other hypermetabolic cervical lymph nodes demonstrated.There are no lesions of the pharyngeal mucosal space.  CHEST  The enlarged right paratracheal and right hilar lymph nodes on recent CT are hypermetabolic. The largest right paratracheal node measures 1.7 cm short axis on image 90 and has an SUV max of 10.0. No hypermetabolic subcarinal, left hilar or axillary adenopathy. The dominant spiculated right upper lobe mass is now partly obscured by atelectasis, but measures approximately 3.3 x 3.1 cm on image 84 and is hypermetabolic with an SUV max of 15.9. The other smaller ground-glass opacities in the right upper lobe (image 84) and in the superior segment of the right lower lobe (image 96) also demonstrate low-level hypermetabolic activity (SUV max 2.1). There is a 9 mm focal ground-glass opacity in the left upper lobe on image 84 which also demonstrates low-level activity. There is increased patchy airspace opacity dependently in the right lower lobe with associated low level metabolic activity, likely inflammatory.  ABDOMEN/PELVIS  The recently biopsied dominant lesion in the right hepatic lobe is hypermetabolic. This lesion measures approximately 3.5 cm on image 128 and has an SUV max of 10.6. There are at least 2 smaller adjacent hypermetabolic metastases within the right hepatic lobe. There is no suspicious activity within the spleen,  pancreas, adrenal glands or kidneys. There is low-level hypermetabolic activity within a single lymph node in the porta hepatis (SUV max 4.0). There is no other hypermetabolic nodal activity. There are low-density adnexal lesions bilaterally status post hysterectomy without associated abnormal metabolic activity. There are multiple low-density renal lesions, including a dominant left renal cyst without suspicious metabolic activity. Nonobstructing calculus noted in  the upper pole of the right kidney, diffuse atherosclerosis and sigmoid diverticulosis noted.  SKELETON  There is a single hypermetabolic osseous metastasis within the right ischium. This is lytic and has an SUV max of 10.2. Low-level activity anteriorly at C1-2 is likely inflammatory. There is a moderate thoracolumbar scoliosis.  IMPRESSION: 1. The dominant right upper lobe pulmonary mass is hypermetabolic, most consistent with bronchogenic carcinoma. There are associated left supraclavicular, right hilar and mediastinal nodal metastases, at least 3 hepatic metastases, and a right ischial nodal metastasis. 2. There are additional ground-glass opacities in both lungs which may represent synchronous adenocarcinoma. 3. Increased patchy dependent opacity in the right lower lobe, likely inflammatory.   Electronically Signed   By: Richardean Sale M.D.   On: 08/15/2014 12:23   Ct Biopsy  08/12/2014   CLINICAL DATA:  Right hepatic mass.  EXAM: CT GUIDED core BIOPSY OF right hepatic lobe mass.  ANESTHESIA/SEDATION: 1.5  Mg IV Versed; 100 mcg IV Fentanyl  Total Moderate Sedation Time: 40 minutes.  PROCEDURE: The procedure risks, benefits, and alternatives were explained to the patient, including pneumothorax, bleeding and infection. Questions regarding the procedure were encouraged and answered. The patient understands and consents to the procedure.  The right upper quadrant of the abdomen was prepped with chlorhexidinein a sterile fashion, and a sterile drape  was applied covering the operative field. Sterile gloves were used for the procedure. Local anesthesia was provided with 1% Lidocaine.  Under CT guidance, 17 gauge guiding needle was directed toward lesion in superior and posterior portion of right hepatic lobe. Four core samples were obtained using 18 gauge biopsy needle. Needle was then removed with the simultaneous injection of Gel-Foam slurry. Appropriate dressing was applied.  Complications: None immediate noted on post biopsy imaging.  FINDINGS: Right hepatic mass as described on prior studies.  IMPRESSION: Under CT guidance, percutaneous core biopsy was performed of right hepatic lobe lesion concerning for metastasis.   Electronically Signed   By: Marijo Conception, M.D.   On: 08/12/2014 11:56   Dg Chest Port 1 View  09/01/2014   CLINICAL DATA:  History of hypertension stroke COPD gastroesophageal reflux and breast cancer, recently diagnosed with metastatic lung cancer involving the liver for the past 2-3 weeks with progressive worsening shortness of breath over that time  EXAM: PORTABLE CHEST - 1 VIEW  COMPARISON:  09/01/2014 at 1:18  FINDINGS: Stable mild cardiac enlargement with central vascular congestion. Extensive bilateral perihilar hazy opacities. Small pleural effusions. Interstitial prominence.  IMPRESSION: Congestive heart failure with moderately severe pulmonary edema stable from prior study   Electronically Signed   By: Skipper Cliche M.D.   On: 09/01/2014 08:27   Dg Chest Portable 1 View  08/31/2014   CLINICAL DATA:  Progressive shortness of breath. History of lung cancer. Liver cancer. Breast cancer. COPD.  EXAM: PORTABLE CHEST - 1 VIEW  COMPARISON:  CT of the chest 08/18/2014.  FINDINGS: The heart is enlarged. Mild edema is present. Bilateral effusions are noted. Bibasilar airspace disease is evident. Nodular disease in the right upper lobe is partially obscured by edema.  IMPRESSION: 1. Cardiomegaly with mild edema and bilateral effusions  compatible with congestive heart failure. 2. Right upper lobe nodule.   Electronically Signed   By: San Morelle M.D.   On: 08/31/2014 17:14    Microbiology: Recent Results (from the past 240 hour(s))  Blood culture (routine x 2)     Status: None   Collection Time: 08/31/14  4:52 PM  Result Value Ref Range Status   Specimen Description BLOOD LEFT ASSIST CONTROL  Final   Special Requests BOTTLES DRAWN AEROBIC AND ANAEROBIC 3ML  Final   Culture NO GROWTH 5 DAYS  Final   Report Status 09/05/2014 FINAL  Final  Blood culture (routine x 2)     Status: None   Collection Time: 08/31/14  4:52 PM  Result Value Ref Range Status   Specimen Description BLOOD RIGHT ASSIST CONTROL  Final   Special Requests BOTTLES DRAWN AEROBIC AND ANAEROBIC 3ML  Final   Culture NO GROWTH 5 DAYS  Final   Report Status 09/05/2014 FINAL  Final  Culture, expectorated sputum-assessment     Status: None   Collection Time: 09/02/14  3:23 PM  Result Value Ref Range Status   Specimen Description EXPECTORATED SPUTUM  Final   Special Requests NONE  Final   Sputum evaluation THIS SPECIMEN IS ACCEPTABLE FOR SPUTUM CULTURE  Final   Report Status 09/02/2014 FINAL  Final  Culture, respiratory (NON-Expectorated)     Status: None   Collection Time: 09/02/14  3:23 PM  Result Value Ref Range Status   Specimen Description EXPECTORATED SPUTUM  Final   Special Requests NONE Reflexed from [id number]  Final   Gram Stain   Final    GOOD SPECIMEN - 80-90% WBCS MODERATE WBC SEEN MODERATE GRAM POSITIVE COCCI IN CLUSTERS IN PAIRS FEW GRAM NEGATIVE RODS FEW GRAM NEGATIVE COCCOBACILLI    Culture APPEARS TO BE NORMAL FLORA  Final   Report Status 09/05/2014 FINAL  Final     Labs: Basic Metabolic Panel:  Recent Labs Lab 09/04/14 0443 09/05/14 0440 09/06/14 0425 09/07/14 0518 09/08/14 0547  NA 136 138 137 137 134*  K 3.8 4.2 3.9 4.0 5.5*  CL 92* 94* 92* 93* 90*  CO2 35* 35* 36* 35* 35*  GLUCOSE 108* 109* 101* 94 142*  BUN  29* 34* 30* 26* 26*  CREATININE 1.25* 1.18* 1.21* 1.25* 1.17*  CALCIUM 8.6* 8.9 9.2 9.1 8.9   Liver Function Tests:  Recent Labs Lab 09/06/14 0425 09/07/14 0518  AST 14* 15  ALT 16 14  ALKPHOS 47 50  BILITOT 0.5 0.4  PROT 5.7* 5.6*  ALBUMIN 2.7* 2.7*   No results for input(s): LIPASE, AMYLASE in the last 168 hours. No results for input(s): AMMONIA in the last 168 hours. CBC:  Recent Labs Lab 09/04/14 0443 09/05/14 0440 09/06/14 0425 09/07/14 0518 09/08/14 0547  WBC 10.8 10.9 9.8 12.0* 8.1  NEUTROABS 8.7* 8.9* 8.2* 9.8* 7.7*  HGB 9.2* 9.2* 9.8* 9.9* 9.8*  HCT 28.1* 28.6* 30.4* 31.1* 30.2*  MCV 85.2 85.7 85.2 85.3 85.8  PLT 335 310 324 344 305   Cardiac Enzymes: No results for input(s): CKTOTAL, CKMB, CKMBINDEX, TROPONINI in the last 168 hours. BNP: BNP (last 3 results)  Recent Labs  08/31/14 1651  BNP 733.0*    ProBNP (last 3 results) No results for input(s): PROBNP in the last 8760 hours.  CBG: No results for input(s): GLUCAP in the last 168 hours.     SignedTracie Harrier   09/08/2014, 12:49 PM

## 2014-09-08 NOTE — Plan of Care (Signed)
Problem: Discharge Progression Outcomes Goal: Other Discharge Outcomes/Goals Plan of Care Progress to Goal:  PT NPO AFTER MIDNIGHT FOR EGD PER ORDERS, PT C/O INABILITY TO SLEEP AT 0330 AND WAS UPSET THAT HE COULD NOT RECEIVE A SLEEPING PILL AT THIS TIME, PT INFORMED ABOUT THE RISK OF TAKING SLEEPING PILLS THIS LATE IN THE MORNING AND GOING TO PROCEDURE, PT GIVEN TRAMADOL X 2 FOR C/O PAIN WITH SOME RELIEF.

## 2014-09-08 NOTE — Clinical Social Work Note (Signed)
Pt is ready for discharge today to [facility]. Pt is agreeable to discharge plan and is agreeable to discharge to plan. Per pt, family is aware of transfer. RN to call report and EMS will provide transportation. CSW is signing off as no further needs identified.   Darden Dates, MSW, LCSW Clinical Social Worker  [phone]

## 2014-09-08 NOTE — Clinical Social Work Placement (Signed)
   CLINICAL SOCIAL WORK PLACEMENT  NOTE  Date:  09/08/2014  Patient Details  Name: Jo Frank MRN: [id number] Date of Birth: December 18, 1928  Clinical Social Work is seeking post-discharge placement for this patient at the [place] level of care (*CSW will initial, date and re-position this form in  chart as items are completed):  Yes   Patient/family provided with [place] Work Department's list of facilities offering this level of care within the geographic area requested by the patient (or if unable, by the patient's family).  Yes   Patient/family informed of their freedom to choose among providers that offer the needed level of care, that participate in Medicare, Medicaid or managed care program needed by the patient, have an available bed and are willing to accept the patient.  Yes   Patient/family informed of [facility]'s ownership interest in [facility] and [facility], as well as of the fact that they are under no obligation to receive care at these facilities.  PASRR submitted to EDS on 09/05/14     PASRR number received on 09/05/14     Existing PASRR number confirmed on       FL2 transmitted to all facilities in geographic area requested by pt/family on 09/05/14     FL2 transmitted to all facilities within larger geographic area on       Patient informed that his/her managed care company has contracts with or will negotiate with certain facilities, including the following:   (SNF packet was given with this information on it)     Yes   Patient/family informed of bed offers received.  Patient chooses bed at  [facility])     Physician recommends and patient chooses bed at      Patient to be transferred to  [facility]) on 09/06/14.  Patient to be transferred to facility by  (EMS)     Patient family notified on 09/08/14 of transfer.  Name of family member notified:  Debria Garret [place]        Additional Comment:    _______________________________________________ Darden Dates, LCSW 09/08/2014, 3:28 PM

## 2014-09-12 ENCOUNTER — Telehealth: Payer: Self-pay | Admitting: *Deleted

## 2014-09-12 NOTE — Telephone Encounter (Signed)
Needs Dx. for CBC that needs to be drawn.  Talked with Sharyn Lull and gave her dx of metastatic lung cancer.

## 2014-09-13 DIAGNOSIS — E875 Hyperkalemia: Secondary | ICD-10-CM | POA: Diagnosis not present

## 2014-09-13 DIAGNOSIS — C3490 Malignant neoplasm of unspecified part of unspecified bronchus or lung: Secondary | ICD-10-CM | POA: Diagnosis not present

## 2014-09-13 DIAGNOSIS — I509 Heart failure, unspecified: Secondary | ICD-10-CM | POA: Diagnosis not present

## 2014-09-13 DIAGNOSIS — E871 Hypo-osmolality and hyponatremia: Secondary | ICD-10-CM | POA: Diagnosis present

## 2014-09-13 LAB — BASIC METABOLIC PANEL
Anion gap: 5 (ref 5–15)
BUN: 28 mg/dL — AB (ref 6–20)
CO2: 32 mmol/L (ref 22–32)
Calcium: 8.9 mg/dL (ref 8.9–10.3)
Chloride: 95 mmol/L — ABNORMAL LOW (ref 101–111)
Creatinine, Ser: 0.94 mg/dL (ref 0.44–1.00)
GFR calc Af Amer: >60 mL/min (ref >60)
GFR calc non Af Amer: 54 mL/min — ABNORMAL LOW (ref >60)
GLUCOSE: 87 mg/dL (ref 65–99)
POTASSIUM: 5.8 mmol/L — AB (ref 3.5–5.1)
Sodium: 132 mmol/L — ABNORMAL LOW (ref 135–145)

## 2014-09-14 DIAGNOSIS — E875 Hyperkalemia: Secondary | ICD-10-CM | POA: Diagnosis not present

## 2014-09-14 LAB — CBC
HEMATOCRIT: 24.6 % — AB (ref 35.0–47.0)
Hemoglobin: 8 g/dL — ABNORMAL LOW (ref 12.0–16.0)
MCH: 27.5 pg (ref 26.0–34.0)
MCHC: 32.6 g/dL (ref 32.0–36.0)
MCV: 84.3 fL (ref 80.0–100.0)
Platelets: 182 10*3/uL (ref 150–440)
RBC: 2.92 MIL/uL — ABNORMAL LOW (ref 3.80–5.20)
RDW: 16.5 % — AB (ref 11.5–14.5)
WBC: 6.5 10*3/uL (ref 3.6–11.0)

## 2014-09-15 DIAGNOSIS — E875 Hyperkalemia: Secondary | ICD-10-CM | POA: Diagnosis not present

## 2014-09-15 LAB — BASIC METABOLIC PANEL
ANION GAP: 8 (ref 5–15)
BUN: 25 mg/dL — ABNORMAL HIGH (ref 6–20)
CHLORIDE: 91 mmol/L — AB (ref 101–111)
CO2: 29 mmol/L (ref 22–32)
CREATININE: 0.86 mg/dL (ref 0.44–1.00)
Calcium: 8.8 mg/dL — ABNORMAL LOW (ref 8.9–10.3)
GFR calc Af Amer: >60 mL/min (ref >60)
GFR calc non Af Amer: >60 mL/min (ref >60)
Glucose, Bld: 90 mg/dL (ref 65–99)
Potassium: 4.4 mmol/L (ref 3.5–5.1)
Sodium: 128 mmol/L — ABNORMAL LOW (ref 135–145)

## 2014-09-17 DIAGNOSIS — E875 Hyperkalemia: Secondary | ICD-10-CM | POA: Diagnosis not present

## 2014-09-17 LAB — BASIC METABOLIC PANEL
ANION GAP: 5 (ref 5–15)
BUN: 22 mg/dL — ABNORMAL HIGH (ref 6–20)
CHLORIDE: 102 mmol/L (ref 101–111)
CO2: 27 mmol/L (ref 22–32)
Calcium: 7.9 mg/dL — ABNORMAL LOW (ref 8.9–10.3)
Creatinine, Ser: 0.76 mg/dL (ref 0.44–1.00)
GFR calc Af Amer: >60 mL/min (ref >60)
GFR calc non Af Amer: >60 mL/min (ref >60)
Glucose, Bld: 102 mg/dL — ABNORMAL HIGH (ref 65–99)
Potassium: 4.2 mmol/L (ref 3.5–5.1)
Sodium: 134 mmol/L — ABNORMAL LOW (ref 135–145)

## 2014-09-21 ENCOUNTER — Ambulatory Visit: Payer: PPO

## 2014-09-21 ENCOUNTER — Other Ambulatory Visit: Payer: PPO

## 2014-09-21 DIAGNOSIS — E875 Hyperkalemia: Secondary | ICD-10-CM | POA: Diagnosis not present

## 2014-09-21 LAB — CBC WITH DIFFERENTIAL/PLATELET
BASOS PCT: 0 %
Basophils Absolute: 0 10*3/uL (ref 0–0.1)
Eosinophils Absolute: 0 10*3/uL (ref 0–0.7)
Eosinophils Relative: 1 %
HEMATOCRIT: 23 % — AB (ref 35.0–47.0)
HEMOGLOBIN: 7.4 g/dL — AB (ref 12.0–16.0)
LYMPHS ABS: 1 10*3/uL (ref 1.0–3.6)
Lymphocytes Relative: 26 %
MCH: 27.9 pg (ref 26.0–34.0)
MCHC: 32.3 g/dL (ref 32.0–36.0)
MCV: 86.3 fL (ref 80.0–100.0)
MONO ABS: 0.8 10*3/uL (ref 0.2–0.9)
MONOS PCT: 22 %
NEUTROS ABS: 2 10*3/uL (ref 1.4–6.5)
NEUTROS PCT: 51 %
Platelets: 159 10*3/uL (ref 150–440)
RBC: 2.67 MIL/uL — ABNORMAL LOW (ref 3.80–5.20)
RDW: 18.1 % — ABNORMAL HIGH (ref 11.5–14.5)
WBC: 3.9 10*3/uL (ref 3.6–11.0)

## 2014-09-22 ENCOUNTER — Telehealth: Payer: Self-pay | Admitting: Family

## 2014-09-22 ENCOUNTER — Ambulatory Visit: Payer: PPO | Admitting: Family

## 2014-09-22 DIAGNOSIS — E875 Hyperkalemia: Secondary | ICD-10-CM | POA: Diagnosis not present

## 2014-09-22 LAB — CBC WITH DIFFERENTIAL/PLATELET
Basophils Absolute: 0 10*3/uL (ref 0–0.1)
Basophils Relative: 0 %
EOS ABS: 0 10*3/uL (ref 0–0.7)
Eosinophils Relative: 1 %
HCT: 25.9 % — ABNORMAL LOW (ref 35.0–47.0)
HEMOGLOBIN: 8.2 g/dL — AB (ref 12.0–16.0)
LYMPHS ABS: 1.3 10*3/uL (ref 1.0–3.6)
LYMPHS PCT: 20 %
MCH: 27.6 pg (ref 26.0–34.0)
MCHC: 31.8 g/dL — ABNORMAL LOW (ref 32.0–36.0)
MCV: 86.9 fL (ref 80.0–100.0)
Monocytes Absolute: 0.9 10*3/uL (ref 0.2–0.9)
Monocytes Relative: 14 %
NEUTROS PCT: 65 %
Neutro Abs: 4.2 10*3/uL (ref 1.4–6.5)
Platelets: 177 10*3/uL (ref 150–440)
RBC: 2.98 MIL/uL — AB (ref 3.80–5.20)
RDW: 18.3 % — ABNORMAL HIGH (ref 11.5–14.5)
WBC: 6.4 10*3/uL (ref 3.6–11.0)

## 2014-09-22 LAB — SURGICAL PATHOLOGY

## 2014-09-22 NOTE — Telephone Encounter (Signed)
Patient had an initial appointment scheduled today (09/22/14) at the outpatient [facility] that she did not show up for. Will attempt to reschedule.

## 2014-09-23 ENCOUNTER — Other Ambulatory Visit
Admission: RE | Admit: 2014-09-23 | Discharge: 2014-09-23 | Disposition: A | Discharge status: Home / Self Care | Payer: PPO | Source: Ambulatory Visit | Attending: Gerontology | Admitting: Gerontology

## 2014-09-23 ENCOUNTER — Encounter: Payer: Self-pay | Admitting: Oncology

## 2014-09-23 ENCOUNTER — Other Ambulatory Visit: Payer: Self-pay | Admitting: *Deleted

## 2014-09-23 DIAGNOSIS — D649 Anemia, unspecified: Secondary | ICD-10-CM | POA: Insufficient documentation

## 2014-09-23 DIAGNOSIS — C3491 Malignant neoplasm of unspecified part of right bronchus or lung: Secondary | ICD-10-CM

## 2014-09-23 DIAGNOSIS — E871 Hypo-osmolality and hyponatremia: Secondary | ICD-10-CM | POA: Diagnosis not present

## 2014-09-23 LAB — CBC WITH DIFFERENTIAL/PLATELET
Basophils Absolute: 0 10*3/uL (ref 0–0.1)
Basophils Relative: 0 %
Eosinophils Absolute: 0 10*3/uL (ref 0–0.7)
Eosinophils Relative: 0 %
HEMATOCRIT: 23.4 % — AB (ref 35.0–47.0)
HEMOGLOBIN: 7.4 g/dL — AB (ref 12.0–16.0)
LYMPHS ABS: 0.2 10*3/uL — AB (ref 1.0–3.6)
Lymphocytes Relative: 3 %
MCH: 27.7 pg (ref 26.0–34.0)
MCHC: 31.7 g/dL — ABNORMAL LOW (ref 32.0–36.0)
MCV: 87.2 fL (ref 80.0–100.0)
MONOS PCT: 6 %
Monocytes Absolute: 0.4 10*3/uL (ref 0.2–0.9)
Neutro Abs: 6.4 10*3/uL (ref 1.4–6.5)
Neutrophils Relative %: 91 %
Platelets: 192 10*3/uL (ref 150–440)
RBC: 2.69 MIL/uL — ABNORMAL LOW (ref 3.80–5.20)
RDW: 18.8 % — ABNORMAL HIGH (ref 11.5–14.5)
WBC: 7.1 10*3/uL (ref 3.6–11.0)

## 2014-09-23 LAB — COMPREHENSIVE METABOLIC PANEL
ALK PHOS: 53 U/L (ref 38–126)
ALT: 38 U/L (ref 14–54)
ANION GAP: 10 (ref 5–15)
AST: 32 U/L (ref 15–41)
Albumin: 2.8 g/dL — ABNORMAL LOW (ref 3.5–5.0)
BILIRUBIN TOTAL: 0.1 mg/dL — AB (ref 0.3–1.2)
BUN: 25 mg/dL — ABNORMAL HIGH (ref 6–20)
CALCIUM: 8.7 mg/dL — AB (ref 8.9–10.3)
CO2: 25 mmol/L (ref 22–32)
Chloride: 97 mmol/L — ABNORMAL LOW (ref 101–111)
Creatinine, Ser: 1.2 mg/dL — ABNORMAL HIGH (ref 0.44–1.00)
GFR calc non Af Amer: 40 mL/min — ABNORMAL LOW (ref >60)
GFR, EST AFRICAN AMERICAN: 47 mL/min — AB (ref >60)
GLUCOSE: 124 mg/dL — AB (ref 65–99)
Potassium: 5.3 mmol/L — ABNORMAL HIGH (ref 3.5–5.1)
Sodium: 132 mmol/L — ABNORMAL LOW (ref 135–145)
Total Protein: 5.4 g/dL — ABNORMAL LOW (ref 6.5–8.1)

## 2014-09-26 DIAGNOSIS — E875 Hyperkalemia: Secondary | ICD-10-CM | POA: Diagnosis not present

## 2014-09-26 LAB — CBC WITH DIFFERENTIAL/PLATELET
Basophils Absolute: 0 10*3/uL (ref 0–0.1)
Basophils Relative: 0 %
Eosinophils Absolute: 0 10*3/uL (ref 0–0.7)
Eosinophils Relative: 0 %
HCT: 24.9 % — ABNORMAL LOW (ref 35.0–47.0)
Hemoglobin: 7.9 g/dL — ABNORMAL LOW (ref 12.0–16.0)
Lymphocytes Relative: 2 %
Lymphs Abs: 0.2 10*3/uL — ABNORMAL LOW (ref 1.0–3.6)
MCH: 27.8 pg (ref 26.0–34.0)
MCHC: 31.6 g/dL — ABNORMAL LOW (ref 32.0–36.0)
MCV: 88 fL (ref 80.0–100.0)
Monocytes Absolute: 0.1 10*3/uL — ABNORMAL LOW (ref 0.2–0.9)
Monocytes Relative: 1 %
Neutro Abs: 9.1 10*3/uL — ABNORMAL HIGH (ref 1.4–6.5)
Neutrophils Relative %: 97 %
Platelets: 202 10*3/uL (ref 150–440)
RBC: 2.83 MIL/uL — ABNORMAL LOW (ref 3.80–5.20)
RDW: 19 % — ABNORMAL HIGH (ref 11.5–14.5)
WBC: 9.3 10*3/uL (ref 3.6–11.0)

## 2014-09-26 LAB — BASIC METABOLIC PANEL
Anion gap: 7 (ref 5–15)
BUN: 22 mg/dL — ABNORMAL HIGH (ref 6–20)
CO2: 26 mmol/L (ref 22–32)
Calcium: 8.4 mg/dL — ABNORMAL LOW (ref 8.9–10.3)
Chloride: 94 mmol/L — ABNORMAL LOW (ref 101–111)
Creatinine, Ser: 0.98 mg/dL (ref 0.44–1.00)
GFR, EST AFRICAN AMERICAN: 60 mL/min — AB (ref >60)
GFR, EST NON AFRICAN AMERICAN: 52 mL/min — AB (ref >60)
GLUCOSE: 183 mg/dL — AB (ref 65–99)
Potassium: 5.5 mmol/L — ABNORMAL HIGH (ref 3.5–5.1)
SODIUM: 127 mmol/L — AB (ref 135–145)

## 2014-09-27 ENCOUNTER — Inpatient Hospital Stay
Admission: EM | Admit: 2014-09-27 | Discharge: 2014-09-28 | DRG: 871 | Disposition: A | Discharge status: Hospice | Payer: PPO | Attending: Internal Medicine | Admitting: Internal Medicine

## 2014-09-27 ENCOUNTER — Other Ambulatory Visit: Payer: Self-pay

## 2014-09-27 ENCOUNTER — Encounter: Payer: Self-pay | Admitting: Emergency Medicine

## 2014-09-27 ENCOUNTER — Emergency Department: Payer: PPO

## 2014-09-27 DIAGNOSIS — Z823 Family history of stroke: Secondary | ICD-10-CM | POA: Diagnosis not present

## 2014-09-27 DIAGNOSIS — J441 Chronic obstructive pulmonary disease with (acute) exacerbation: Secondary | ICD-10-CM | POA: Diagnosis present

## 2014-09-27 DIAGNOSIS — Z85118 Personal history of other malignant neoplasm of bronchus and lung: Secondary | ICD-10-CM | POA: Diagnosis not present

## 2014-09-27 DIAGNOSIS — J189 Pneumonia, unspecified organism: Secondary | ICD-10-CM | POA: Diagnosis present

## 2014-09-27 DIAGNOSIS — Z9049 Acquired absence of other specified parts of digestive tract: Secondary | ICD-10-CM | POA: Diagnosis present

## 2014-09-27 DIAGNOSIS — Z515 Encounter for palliative care: Secondary | ICD-10-CM

## 2014-09-27 DIAGNOSIS — A419 Sepsis, unspecified organism: Secondary | ICD-10-CM | POA: Diagnosis present

## 2014-09-27 DIAGNOSIS — Z6828 Body mass index (BMI) 28.0-28.9, adult: Secondary | ICD-10-CM

## 2014-09-27 DIAGNOSIS — Z8673 Personal history of transient ischemic attack (TIA), and cerebral infarction without residual deficits: Secondary | ICD-10-CM

## 2014-09-27 DIAGNOSIS — I10 Essential (primary) hypertension: Secondary | ICD-10-CM | POA: Diagnosis present

## 2014-09-27 DIAGNOSIS — D649 Anemia, unspecified: Secondary | ICD-10-CM | POA: Diagnosis present

## 2014-09-27 DIAGNOSIS — N200 Calculus of kidney: Secondary | ICD-10-CM | POA: Diagnosis not present

## 2014-09-27 DIAGNOSIS — Z791 Long term (current) use of non-steroidal anti-inflammatories (NSAID): Secondary | ICD-10-CM | POA: Diagnosis not present

## 2014-09-27 DIAGNOSIS — Z7951 Long term (current) use of inhaled steroids: Secondary | ICD-10-CM

## 2014-09-27 DIAGNOSIS — E871 Hypo-osmolality and hyponatremia: Secondary | ICD-10-CM | POA: Diagnosis present

## 2014-09-27 DIAGNOSIS — Z9012 Acquired absence of left breast and nipple: Secondary | ICD-10-CM | POA: Diagnosis present

## 2014-09-27 DIAGNOSIS — J9621 Acute and chronic respiratory failure with hypoxia: Secondary | ICD-10-CM | POA: Diagnosis present

## 2014-09-27 DIAGNOSIS — Z8249 Family history of ischemic heart disease and other diseases of the circulatory system: Secondary | ICD-10-CM | POA: Diagnosis not present

## 2014-09-27 DIAGNOSIS — Z7902 Long term (current) use of antithrombotics/antiplatelets: Secondary | ICD-10-CM

## 2014-09-27 DIAGNOSIS — C787 Secondary malignant neoplasm of liver and intrahepatic bile duct: Secondary | ICD-10-CM | POA: Diagnosis present

## 2014-09-27 DIAGNOSIS — K219 Gastro-esophageal reflux disease without esophagitis: Secondary | ICD-10-CM | POA: Diagnosis present

## 2014-09-27 DIAGNOSIS — Z66 Do not resuscitate: Secondary | ICD-10-CM | POA: Diagnosis present

## 2014-09-27 DIAGNOSIS — Z800 Family history of malignant neoplasm of digestive organs: Secondary | ICD-10-CM | POA: Diagnosis not present

## 2014-09-27 DIAGNOSIS — Z888 Allergy status to other drugs, medicaments and biological substances status: Secondary | ICD-10-CM | POA: Diagnosis not present

## 2014-09-27 DIAGNOSIS — C3490 Malignant neoplasm of unspecified part of unspecified bronchus or lung: Secondary | ICD-10-CM | POA: Diagnosis present

## 2014-09-27 DIAGNOSIS — R6521 Severe sepsis with septic shock: Secondary | ICD-10-CM | POA: Diagnosis present

## 2014-09-27 DIAGNOSIS — Z853 Personal history of malignant neoplasm of breast: Secondary | ICD-10-CM | POA: Diagnosis not present

## 2014-09-27 DIAGNOSIS — Z880 Allergy status to penicillin: Secondary | ICD-10-CM | POA: Diagnosis not present

## 2014-09-27 DIAGNOSIS — Z87891 Personal history of nicotine dependence: Secondary | ICD-10-CM

## 2014-09-27 DIAGNOSIS — Y95 Nosocomial condition: Secondary | ICD-10-CM | POA: Diagnosis present

## 2014-09-27 DIAGNOSIS — Z87442 Personal history of urinary calculi: Secondary | ICD-10-CM | POA: Diagnosis not present

## 2014-09-27 DIAGNOSIS — J9620 Acute and chronic respiratory failure, unspecified whether with hypoxia or hypercapnia: Secondary | ICD-10-CM | POA: Diagnosis not present

## 2014-09-27 DIAGNOSIS — Z886 Allergy status to analgesic agent status: Secondary | ICD-10-CM

## 2014-09-27 DIAGNOSIS — Z808 Family history of malignant neoplasm of other organs or systems: Secondary | ICD-10-CM | POA: Diagnosis not present

## 2014-09-27 DIAGNOSIS — J9690 Respiratory failure, unspecified, unspecified whether with hypoxia or hypercapnia: Secondary | ICD-10-CM

## 2014-09-27 LAB — BASIC METABOLIC PANEL
Anion gap: 7 (ref 5–15)
BUN: 26 mg/dL — AB (ref 6–20)
CALCIUM: 8.6 mg/dL — AB (ref 8.9–10.3)
CO2: 24 mmol/L (ref 22–32)
Chloride: 97 mmol/L — ABNORMAL LOW (ref 101–111)
Creatinine, Ser: 1.13 mg/dL — ABNORMAL HIGH (ref 0.44–1.00)
GFR calc Af Amer: 50 mL/min — ABNORMAL LOW (ref >60)
GFR, EST NON AFRICAN AMERICAN: 43 mL/min — AB (ref >60)
GLUCOSE: 152 mg/dL — AB (ref 65–99)
Potassium: 4.8 mmol/L (ref 3.5–5.1)
Sodium: 128 mmol/L — ABNORMAL LOW (ref 135–145)

## 2014-09-27 LAB — CBC
HEMATOCRIT: 23.6 % — AB (ref 35.0–47.0)
Hemoglobin: 7.3 g/dL — ABNORMAL LOW (ref 12.0–16.0)
MCH: 27.9 pg (ref 26.0–34.0)
MCHC: 30.8 g/dL — AB (ref 32.0–36.0)
MCV: 90.5 fL (ref 80.0–100.0)
Platelets: 207 10*3/uL (ref 150–440)
RBC: 2.61 MIL/uL — ABNORMAL LOW (ref 3.80–5.20)
RDW: 20.3 % — AB (ref 11.5–14.5)
WBC: 14.8 10*3/uL — ABNORMAL HIGH (ref 3.6–11.0)

## 2014-09-27 LAB — COMPREHENSIVE METABOLIC PANEL
ALT: 26 U/L (ref 14–54)
AST: 21 U/L (ref 15–41)
Albumin: 2.9 g/dL — ABNORMAL LOW (ref 3.5–5.0)
Alkaline Phosphatase: 53 U/L (ref 38–126)
Anion gap: 8 (ref 5–15)
BUN: 25 mg/dL — AB (ref 6–20)
CHLORIDE: 92 mmol/L — AB (ref 101–111)
CO2: 25 mmol/L (ref 22–32)
CREATININE: 1.05 mg/dL — AB (ref 0.44–1.00)
Calcium: 8.8 mg/dL — ABNORMAL LOW (ref 8.9–10.3)
GFR calc Af Amer: 55 mL/min — ABNORMAL LOW (ref >60)
GFR, EST NON AFRICAN AMERICAN: 47 mL/min — AB (ref >60)
Glucose, Bld: 216 mg/dL — ABNORMAL HIGH (ref 65–99)
Potassium: 5.1 mmol/L (ref 3.5–5.1)
SODIUM: 125 mmol/L — AB (ref 135–145)
Total Bilirubin: 0.7 mg/dL (ref 0.3–1.2)
Total Protein: 5.7 g/dL — ABNORMAL LOW (ref 6.5–8.1)

## 2014-09-27 LAB — CBC WITH DIFFERENTIAL/PLATELET
Basophils Absolute: 0 10*3/uL (ref 0–0.1)
Basophils Relative: 0 %
EOS ABS: 0 10*3/uL (ref 0–0.7)
Eosinophils Relative: 0 %
HEMATOCRIT: 24.3 % — AB (ref 35.0–47.0)
Hemoglobin: 7.7 g/dL — ABNORMAL LOW (ref 12.0–16.0)
LYMPHS ABS: 0.3 10*3/uL — AB (ref 1.0–3.6)
LYMPHS PCT: 2 %
MCH: 27.5 pg (ref 26.0–34.0)
MCHC: 31.7 g/dL — ABNORMAL LOW (ref 32.0–36.0)
MCV: 86.8 fL (ref 80.0–100.0)
Monocytes Absolute: 0.8 10*3/uL (ref 0.2–0.9)
Monocytes Relative: 6 %
NEUTROS ABS: 12.9 10*3/uL — AB (ref 1.4–6.5)
NEUTROS PCT: 92 %
Platelets: 232 10*3/uL (ref 150–440)
RBC: 2.8 MIL/uL — AB (ref 3.80–5.20)
RDW: 19.2 % — ABNORMAL HIGH (ref 11.5–14.5)
WBC: 14.1 10*3/uL — AB (ref 3.6–11.0)

## 2014-09-27 LAB — TROPONIN I: TROPONIN I: 0.03 ng/mL (ref <0.031)

## 2014-09-27 LAB — PROCALCITONIN: PROCALCITONIN: 0.4 ng/mL

## 2014-09-27 LAB — BRAIN NATRIURETIC PEPTIDE
B NATRIURETIC PEPTIDE 5: 621 pg/mL — AB (ref 0.0–100.0)
B Natriuretic Peptide: 600 pg/mL — ABNORMAL HIGH (ref 0.0–100.0)

## 2014-09-27 LAB — GLUCOSE, CAPILLARY: Glucose-Capillary: 147 mg/dL — ABNORMAL HIGH (ref 65–99)

## 2014-09-27 LAB — MRSA PCR SCREENING: MRSA BY PCR: NEGATIVE

## 2014-09-27 MED ORDER — PANTOPRAZOLE SODIUM 40 MG PO TBEC
40.0000 mg | DELAYED_RELEASE_TABLET | Freq: Every day | ORAL | Status: DC
  Administered 2014-09-28: 40 mg via ORAL
  Filled 2014-09-27: qty 1

## 2014-09-27 MED ORDER — ACETAMINOPHEN 325 MG PO TABS: 650.0000 mg | ORAL_TABLET | Freq: Four times a day (QID) | ORAL | Status: DC | PRN

## 2014-09-27 MED ORDER — MORPHINE SULFATE (PF) 2 MG/ML IV SOLN
2.0000 mg | INTRAVENOUS | Status: DC | PRN
  Administered 2014-09-27 – 2014-09-28 (×2): 2 mg via INTRAVENOUS
  Filled 2014-09-27 (×3): qty 1

## 2014-09-27 MED ORDER — AZTREONAM 2 G IJ SOLR
2.0000 g | Freq: Once | INTRAMUSCULAR | Status: DC
  Filled 2014-09-27: qty 2

## 2014-09-27 MED ORDER — BUDESONIDE 0.25 MG/2ML IN SUSP
0.2500 mg | Freq: Four times a day (QID) | RESPIRATORY_TRACT | Status: DC
  Administered 2014-09-27 – 2014-09-28 (×4): 0.25 mg via RESPIRATORY_TRACT
  Filled 2014-09-27 (×4): qty 2

## 2014-09-27 MED ORDER — ALUMINUM & MAGNESIUM HYDROXIDE 200-200 MG/5ML PO SUSP: 30.0000 mL | ORAL | Status: DC | PRN

## 2014-09-27 MED ORDER — VITAMIN B-12 100 MCG PO TABS
100.0000 ug | ORAL_TABLET | Freq: Every day | ORAL | Status: DC
Start: 2014-09-27 — End: 2014-09-28
  Administered 2014-09-28: 100 ug via ORAL
  Filled 2014-09-27: qty 1

## 2014-09-27 MED ORDER — SODIUM CHLORIDE 0.9 % IV SOLN
INTRAVENOUS | Status: DC
  Administered 2014-09-27 – 2014-09-28 (×3): via INTRAVENOUS

## 2014-09-27 MED ORDER — IPRATROPIUM-ALBUTEROL 0.5-2.5 (3) MG/3ML IN SOLN
3.0000 mL | RESPIRATORY_TRACT | Status: DC
  Administered 2014-09-27 (×3): 3 mL via RESPIRATORY_TRACT
  Filled 2014-09-27 (×2): qty 3

## 2014-09-27 MED ORDER — ONDANSETRON HCL 4 MG/2ML IJ SOLN: 4.0000 mg | Freq: Four times a day (QID) | INTRAMUSCULAR | Status: DC | PRN

## 2014-09-27 MED ORDER — SODIUM CHLORIDE 0.9 % IV BOLUS (SEPSIS)
1000.0000 mL | Freq: Once | INTRAVENOUS | Status: AC
  Administered 2014-09-27: 1000 mL via INTRAVENOUS

## 2014-09-27 MED ORDER — FERROUS SULFATE 325 (65 FE) MG PO TABS
325.0000 mg | ORAL_TABLET | Freq: Two times a day (BID) | ORAL | Status: DC
  Administered 2014-09-27 – 2014-09-28 (×2): 325 mg via ORAL
  Filled 2014-09-27 (×2): qty 1

## 2014-09-27 MED ORDER — MORPHINE SULFATE (CONCENTRATE) 10 MG/0.5ML PO SOLN: 5.0000 mg | ORAL | Status: DC | PRN

## 2014-09-27 MED ORDER — IPRATROPIUM-ALBUTEROL 0.5-2.5 (3) MG/3ML IN SOLN
3.0000 mL | Freq: Once | RESPIRATORY_TRACT | Status: AC
  Administered 2014-09-27: 3 mL via RESPIRATORY_TRACT

## 2014-09-27 MED ORDER — GUAIFENESIN-CODEINE 100-10 MG/5ML PO SOLN: 5.0000 mL | Freq: Four times a day (QID) | ORAL | Status: DC | PRN

## 2014-09-27 MED ORDER — MORPHINE SULFATE (PF) 2 MG/ML IV SOLN: 2.0000 mg | INTRAVENOUS | Status: DC | PRN

## 2014-09-27 MED ORDER — METHYLPREDNISOLONE SODIUM SUCC 125 MG IJ SOLR
60.0000 mg | INTRAMUSCULAR | Status: DC
  Administered 2014-09-27 – 2014-09-28 (×2): 60 mg via INTRAVENOUS
  Filled 2014-09-27 (×2): qty 2

## 2014-09-27 MED ORDER — SODIUM CHLORIDE 0.9 % IJ SOLN
3.0000 mL | Freq: Two times a day (BID) | INTRAMUSCULAR | Status: DC
  Administered 2014-09-27 – 2014-09-28 (×3): 3 mL via INTRAVENOUS

## 2014-09-27 MED ORDER — PRO-STAT SUGAR FREE PO LIQD
30.0000 mL | Freq: Two times a day (BID) | ORAL | Status: DC
  Administered 2014-09-27 – 2014-09-28 (×2): 30 mL via ORAL

## 2014-09-27 MED ORDER — LEVOFLOXACIN IN D5W 750 MG/150ML IV SOLN
750.0000 mg | INTRAVENOUS | Status: DC
  Filled 2014-09-27: qty 150

## 2014-09-27 MED ORDER — VITAMIN C 500 MG PO TABS
500.0000 mg | ORAL_TABLET | Freq: Every day | ORAL | Status: DC
  Administered 2014-09-28: 500 mg via ORAL
  Filled 2014-09-27 (×2): qty 1

## 2014-09-27 MED ORDER — NOREPINEPHRINE BITARTRATE 1 MG/ML IV SOLN: 0.0000 ug/min | INTRAVENOUS | Status: DC

## 2014-09-27 MED ORDER — LEVOFLOXACIN IN D5W 500 MG/100ML IV SOLN
500.0000 mg | Freq: Once | INTRAVENOUS | Status: AC
  Administered 2014-09-27: 500 mg via INTRAVENOUS
  Filled 2014-09-27: qty 100

## 2014-09-27 MED ORDER — CLOPIDOGREL BISULFATE 75 MG PO TABS
75.0000 mg | ORAL_TABLET | Freq: Every day | ORAL | Status: DC
  Administered 2014-09-27 – 2014-09-28 (×2): 75 mg via ORAL
  Filled 2014-09-27 (×2): qty 1

## 2014-09-27 MED ORDER — NOREPINEPHRINE 4 MG/250ML-% IV SOLN
0.0000 ug/min | INTRAVENOUS | Status: DC
  Administered 2014-09-27: 2 ug/min via INTRAVENOUS
  Filled 2014-09-27: qty 250

## 2014-09-27 MED ORDER — HEPARIN SODIUM (PORCINE) 5000 UNIT/ML IJ SOLN
5000.0000 [IU] | Freq: Three times a day (TID) | INTRAMUSCULAR | Status: DC
  Administered 2014-09-27 – 2014-09-28 (×4): 5000 [IU] via SUBCUTANEOUS
  Filled 2014-09-27 (×4): qty 1

## 2014-09-27 MED ORDER — DIPHENHYDRAMINE HCL 50 MG/ML IJ SOLN
12.5000 mg | Freq: Every evening | INTRAMUSCULAR | Status: DC | PRN
  Administered 2014-09-28: 12.5 mg via INTRAVENOUS
  Filled 2014-09-27: qty 1

## 2014-09-27 MED ORDER — OXYCODONE HCL 5 MG PO TABS: 5.0000 mg | ORAL_TABLET | ORAL | Status: DC | PRN

## 2014-09-27 MED ORDER — PANTOPRAZOLE SODIUM 20 MG PO TBEC
20.0000 mg | DELAYED_RELEASE_TABLET | Freq: Every day | ORAL | Status: DC
  Filled 2014-09-27 (×2): qty 1

## 2014-09-27 MED ORDER — LORAZEPAM 0.5 MG PO TABS
0.5000 mg | ORAL_TABLET | ORAL | Status: DC | PRN
  Administered 2014-09-28 (×3): 0.5 mg via ORAL
  Filled 2014-09-27 (×3): qty 1

## 2014-09-27 MED ORDER — IPRATROPIUM-ALBUTEROL 0.5-2.5 (3) MG/3ML IN SOLN
3.0000 mL | Freq: Four times a day (QID) | RESPIRATORY_TRACT | Status: DC
  Administered 2014-09-27 – 2014-09-28 (×4): 3 mL via RESPIRATORY_TRACT
  Filled 2014-09-27 (×4): qty 3

## 2014-09-27 MED ORDER — BUDESONIDE-FORMOTEROL FUMARATE 160-4.5 MCG/ACT IN AERO
2.0000 | INHALATION_SPRAY | Freq: Two times a day (BID) | RESPIRATORY_TRACT | Status: DC
  Administered 2014-09-27: 2 via RESPIRATORY_TRACT
  Filled 2014-09-27: qty 6

## 2014-09-27 MED ORDER — ACETAMINOPHEN 650 MG RE SUPP: 650.0000 mg | Freq: Four times a day (QID) | RECTAL | Status: DC | PRN

## 2014-09-27 MED ORDER — SODIUM CHLORIDE 0.9 % IV SOLN
INTRAVENOUS | Status: DC
Start: 2014-09-27 — End: 2014-09-27

## 2014-09-27 MED ORDER — DEXTROSE 5 % IV SOLN
2.0000 g | Freq: Three times a day (TID) | INTRAVENOUS | Status: DC
  Administered 2014-09-27 – 2014-09-28 (×3): 2 g via INTRAVENOUS
  Filled 2014-09-27 (×8): qty 2

## 2014-09-27 MED ORDER — ONDANSETRON HCL 4 MG PO TABS: 4.0000 mg | ORAL_TABLET | Freq: Four times a day (QID) | ORAL | Status: DC | PRN

## 2014-09-27 MED ORDER — AZTREONAM 2 G IJ SOLR
2.0000 g | Freq: Once | INTRAMUSCULAR | Status: AC
  Administered 2014-09-27: 2 g via INTRAVENOUS
  Filled 2014-09-27: qty 2

## 2014-09-27 MED ORDER — METHOCARBAMOL 500 MG PO TABS: 500.0000 mg | ORAL_TABLET | Freq: Four times a day (QID) | ORAL | Status: DC | PRN

## 2014-09-27 MED ORDER — VANCOMYCIN HCL IN DEXTROSE 1-5 GM/200ML-% IV SOLN
1000.0000 mg | INTRAVENOUS | Status: DC
  Administered 2014-09-27: 1000 mg via INTRAVENOUS
  Filled 2014-09-27 (×3): qty 200

## 2014-09-27 MED ORDER — DILTIAZEM HCL ER COATED BEADS 120 MG PO CP24
120.0000 mg | ORAL_CAPSULE | Freq: Every day | ORAL | Status: DC
  Administered 2014-09-27: 120 mg via ORAL
  Filled 2014-09-27: qty 1

## 2014-09-27 MED ORDER — GABAPENTIN 100 MG PO CAPS
100.0000 mg | ORAL_CAPSULE | Freq: Every day | ORAL | Status: DC
  Administered 2014-09-28: 100 mg via ORAL
  Filled 2014-09-27: qty 1

## 2014-09-27 MED ORDER — VANCOMYCIN HCL IN DEXTROSE 1-5 GM/200ML-% IV SOLN
1000.0000 mg | Freq: Once | INTRAVENOUS | Status: AC
  Administered 2014-09-27: 1000 mg via INTRAVENOUS
  Filled 2014-09-27: qty 200

## 2014-09-27 NOTE — Progress Notes (Signed)
A&Ox4 talking with grandaughter who is at bedside. Denies pain. Dyspnic with minimal exertion including eating. Stach per cardiac monitor. o2 sats low 90's on 4L nasal cannula. Levophed drip titrated off this evening and blood pressure is stable. Continue to monitor respiratory status and need for bipap.

## 2014-09-27 NOTE — Progress Notes (Signed)
Palliative Care Update   Pt has been seen and examined and chart has been reviewed.  I spoke to patient while her granddaughter was present.  Pt is found to (at least currently) have the ability to make her own healthcare decisions.  She is also noted to be experiencing a significant degree of respiratory distress, though she is still mentating well.  She wishes to be DNR . She also wishes to be DNI (in case this is considered electively in a 'non-code' situation). She would like BIPAP or other noninvasive measures undertaken to help her breathe better. She is Ok with a slight increase in the morphine being given to her.  She only got two doses and she isn't sure if this helped or not.  She has some anxiety and is tachycardic also.  Nursing will give morphine and possibly also ativan if needed. Respiratory will need to be contacted also.    Pts granddaughter is entirely supportive of patient's decisions.    Full note to follow.    Kirby Funk, MD

## 2014-09-27 NOTE — ED Notes (Signed)
IV on left  forearm placed at nursing home is infiltrated. IV removed by Curly Rim.

## 2014-09-27 NOTE — Progress Notes (Signed)
PROGRESS NOTE  MARIALENA WOLLEN RKY:[id number] DOB: 05/03/1928 DOA: 09/27/2014 PCP: [name], MD  Subjective 81 y/o f with hx of COPD,HTN, Metastatic Lung ca, recently discharged to [facility]  readmitted with Acute shortness of breath. Placed on BIPAP with some improvement Also Hypotensive and is currently on pressors    Objective: BP 120/57 mmHg  Pulse 103  Temp(Src) 97.7 F (36.5 C) (Axillary)  Resp 31  Ht '5\' 1"'$  (1.549 m)  Wt 69.4 kg (153 lb)  BMI 28.92 kg/m2  SpO2 99%  Intake/Output Summary (Last 24 hours) at 09/27/14 1314 Last data filed at 09/27/14 1200  Gross per 24 hour  Intake 1129.53 ml  Output    400 ml  Net 729.53 ml   Filed Weights   09/27/14 0105  Weight: 69.4 kg (153 lb)    Exam:  General: Elderly  female - in Mild distress HEENT: PERRL; OP moist without lesions. Neck: supple, trachea midline, no thyromegaly Chest: normal to palpation Lungs:Rhonchi + Bilat  Cardiovascular: Tachycardic-RRR, no murmur, no gallop Abdomen: soft, nontender, nondistended, positive bowel sounds Extremities: no clubbing, cyanosis, edema Neuro: alert and oriented, moves all extremities Derm: no significant rashes or nodules; good skin turgor Lymph: no cervical or supraclavicular lymphadenopathy   Labs and imaging studies were reviewed*  Data Reviewed: Basic Metabolic Panel:  Recent Labs Lab 09/23/14 1500 09/26/14 0745 09/27/14 0107 09/27/14 0747  NA 132* 127* 125* 128*  K 5.3* 5.5* 5.1 4.8  CL 97* 94* 92* 97*  CO2 '25 26 25 24  '$ GLUCOSE 124* 183* 216* 152*  BUN 25* 22* 25* 26*  CREATININE 1.20* 0.98 1.05* 1.13*  CALCIUM 8.7* 8.4* 8.8* 8.6*   Liver Function Tests:  Recent Labs Lab 09/23/14 1500 09/27/14 0107  AST 32 21  ALT 38 26  ALKPHOS 53 53  BILITOT 0.1* 0.7  PROT 5.4* 5.7*  ALBUMIN 2.8* 2.9*   No results for input(s): LIPASE, AMYLASE in the last 168 hours. No results for input(s): AMMONIA in the last 168 hours. CBC:  Recent  Labs Lab 09/21/14 0650 09/22/14 0813 09/23/14 1500 09/26/14 0745 09/27/14 0107 09/27/14 0747  WBC 3.9 6.4 7.1 9.3 14.1* 14.8*  NEUTROABS 2.0 4.2 6.4 9.1* 12.9*  --   HGB 7.4* 8.2* 7.4* 7.9* 7.7* 7.3*  HCT 23.0* 25.9* 23.4* 24.9* 24.3* 23.6*  MCV 86.3 86.9 87.2 88.0 86.8 90.5  PLT 159 177 192 202 232 207   Cardiac Enzymes:    Recent Labs Lab 09/27/14 0107  TROPONINI 0.03   BNP (last 3 results)  Recent Labs  08/31/14 1651 09/27/14 0108  BNP 733.0* 600.0*    ProBNP (last 3 results) No results for input(s): PROBNP in the last 8760 hours.  CBG:  Recent Labs Lab 09/27/14 0448  GLUCAP 147*    Recent Results (from the past 240 hour(s))  MRSA PCR Screening     Status: None   Collection Time: 09/27/14  4:55 AM  Result Value Ref Range Status   MRSA by PCR NEGATIVE NEGATIVE Final    Comment:        The GeneXpert MRSA Assay (FDA approved for NASAL specimens only), is one component of a comprehensive MRSA colonization surveillance program. It is not intended to diagnose MRSA infection nor to guide or monitor treatment for MRSA infections.      Studies: Dg Chest Portable 1 View  09/27/2014   CLINICAL DATA:  Dyspnea.  EXAM: PORTABLE CHEST - 1 VIEW  COMPARISON:  09/06/2014  FINDINGS: Confluent alveolar  opacities are present in the central lung regions bilaterally, a substantial worsening from 09/06/2014. This may represent infectious infiltrate. Pulmonary hemorrhage may also produce this appearance. There may be a small right pleural effusion.  IMPRESSION: Development of extensive central alveolar opacities since 09/06/2014, likely infectious infiltrate. Probable small right pleural effusion.   Electronically Signed   By: Andreas Newport M.D.   On: 09/27/2014 01:24    Scheduled Meds: . aztreonam  2 g Intravenous 3 times per day  . budesonide  0.25 mg Nebulization 4 times per day  . clopidogrel  75 mg Oral Q breakfast  . feeding supplement (PRO-STAT SUGAR FREE 64)   30 mL Oral BID  . ferrous sulfate  325 mg Oral BID  . gabapentin  100 mg Oral Daily  . heparin  5,000 Units Subcutaneous 3 times per day  . ipratropium-albuterol  3 mL Nebulization Q6H  . methylPREDNISolone (SOLU-MEDROL) injection  60 mg Intravenous Q24H  . pantoprazole  40 mg Oral QAC breakfast  . sodium chloride  3 mL Intravenous Q12H  . vancomycin  1,000 mg Intravenous Q24H  . vitamin B-12  100 mcg Oral Daily  . vitamin C  500 mg Oral Daily    Continuous Infusions: . sodium chloride 125 mL/hr at 09/27/14 0718  . norepinephrine 4 mcg/min (09/27/14 1041)    Assessment/Plan:   1  Acute on chronic respiratory failure with hypoxia secondary to COPD exacerbation and Pneumonia. Continue O2, SVN's , IV Solumedrol and IV Antibiotics(Aztreonam and Vanc) Currently off BIPAP 2 Metastatic lung ca; Recently started on Chemo 3 Septic Shock: BP improving- On  pressors - wean down as appropriate. 4 HTN: BP meds on hold 5 Hx of CVA: On Plavix  6 Hyponatremia:Continue to monitor  Code Status: Full  Family Communication: Grandaughter        09/27/2014, 1:14 PM  LOS: 0 days

## 2014-09-27 NOTE — H&P (Signed)
[facility] NAME: Jo Frank    MR#:  [id number]  DATE OF BIRTH:  October 05, 1928   DATE OF ADMISSION:  09/27/2014  PRIMARY CARE PHYSICIAN: [name], MD   REQUESTING/REFERRING PHYSICIAN: Marjean Donna  CHIEF COMPLAINT:   Chief Complaint  Patient presents with  . Shortness of Breath    HISTORY OF PRESENT ILLNESS:  Jo Frank  is a 81 y.o. female with a known history of COPD non-oxygen dependent presenting with shortness of breath. Of note recently in the hospital with discharge diagnosis COPD exacerbation, bronchitis She describes 2-3 day duration shortness of breath with associated cough, productive-brownish sputum, denies any frank fevers or chills. Denies any chest pain or further symptoms. Her respiratory status declined today where she was struggling to breathe. Brought to Hospital further workup and evaluation. She was an respiratory distress upon arrival subsequently placed on BiPAP therapy with improvement of her respiratory status. Emergency department course: Receiving treatment for pneumonia she became hypotensive upon my arrival, bolused with IV fluids, subsequently require pressor therapy which will hopefully be weaned off shortly.  PAST MEDICAL HISTORY:   Past Medical History  Diagnosis Date  . Hypertension   . CVA (cerebral infarction) 2010    Was on Plavix for 2 years, then switched to clopidogrel but d/c due to side effects ("sick")  . Nephrolithiasis     per patient, passes on her own  . Stroke   . COPD (chronic obstructive pulmonary disease)   . GERD (gastroesophageal reflux disease)   . Breast cancer 2013    breast cancer right-radiation-lumpectomy  . Lung cancer     PAST SURGICAL HISTORY:   Past Surgical History  Procedure Laterality Date  . Breast lumpectomy      left side  . Abdominal hysterectomy  1960s    "bleeding profusely"  . Cholecystectomy    . Hand surgery      s/p fall     SOCIAL HISTORY:   Social History  Substance Use Topics  . Smoking status: Former Smoker -- 0.50 packs/day for 30 years    Types: Cigarettes    Quit date: 12/07/1978  . Smokeless tobacco: Never Used  . Alcohol Use: No    FAMILY HISTORY:   Family History  Problem Relation Age of Onset  . Throat cancer Mother     heavy smoker & drinker, died at 79 yo (1978)  . Heart failure Father   . Hypertension Sister   . Stroke Sister     DRUG ALLERGIES:   Allergies  Allergen Reactions  . Aspirin Shortness Of Breath, Swelling and Other (See Comments)    Pt states that she vomits blood.   Marland Kitchen Penicillins Anaphylaxis  . Nitrofuran Derivatives Nausea And Vomiting    REVIEW OF SYSTEMS:  REVIEW OF SYSTEMS:  CONSTITUTIONAL: Denies fevers, chills, positive fatigue, weakness.  EYES: Denies blurred vision, double vision, or eye pain.  EARS, NOSE, THROAT: Denies tinnitus, ear pain, hearing loss.  RESPIRATORY: Positive cough, shortness of breath, wheezing  CARDIOVASCULAR: Denies chest pain, palpitations, edema.  GASTROINTESTINAL: Denies nausea, vomiting, diarrhea, abdominal pain.  GENITOURINARY: Denies dysuria, hematuria.  ENDOCRINE: Denies nocturia or thyroid problems. HEMATOLOGIC AND LYMPHATIC: positive easy bruising  denies bleeding.  SKIN: Denies rash or lesions.  MUSCULOSKELETAL: Denies pain in neck, back, shoulder, knees, hips, or further arthritic symptoms.  NEUROLOGIC: Denies paralysis, paresthesias.  PSYCHIATRIC: Denies anxiety or depressive symptoms. Otherwise full review of systems performed by me is  negative.   MEDICATIONS AT HOME:   Prior to Admission medications   Medication Sig Start Date End Date Taking? Authorizing Provider  acetaminophen (TYLENOL) 500 MG tablet Take 1,000 mg by mouth every 6 (six) hours as needed for mild pain or headache.    Yes Historical Provider, MD  albuterol (PROVENTIL HFA;VENTOLIN HFA) 108 (90 BASE) MCG/ACT inhaler Inhale 1-2 puffs into the  lungs every 4 (four) hours as needed for wheezing or shortness of breath. 08/18/14  Yes Forest Gleason, MD  aluminum-magnesium hydroxide 200-200 MG/5ML suspension Take 30 mLs by mouth every 4 (four) hours as needed for indigestion. Strength should be '480mg'$ -'480mg'$ -48/38m   Yes Historical Provider, MD  Amino Acids-Protein Hydrolys (FEEDING SUPPLEMENT, PRO-STAT SUGAR FREE 64,) LIQD Take 30 mLs by mouth 2 (two) times daily. Between meals   Yes Historical Provider, MD  budesonide-formoterol (SYMBICORT) 160-4.5 MCG/ACT inhaler Inhale 2 puffs into the lungs 2 (two) times daily.   Yes Historical Provider, MD  clindamycin in dextrose 5 % 50 mL Inject 600 mg into the vein 2 (two) times daily. 09/23/14 10/01/14 Yes Historical Provider, MD  clopidogrel (PLAVIX) 75 MG tablet Take 1 tablet (75 mg total) by mouth daily with breakfast. 09/08/14  Yes Vishwanath Hande, MD  diltiazem (CARDIZEM CD) 120 MG 24 hr capsule Take 1 capsule (120 mg total) by mouth daily. 09/08/14  Yes Vishwanath Hande, MD  ferrous sulfate 325 (65 FE) MG tablet Take 325 mg by mouth 2 (two) times daily.   Yes Historical Provider, MD  gabapentin (NEURONTIN) 100 MG capsule Take 100 mg by mouth daily.   Yes Historical Provider, MD  guaiFENesin-codeine 100-10 MG/5ML syrup Take 5 mLs by mouth every 6 (six) hours as needed for cough. 09/08/14  Yes Vishwanath Hande, MD  Ipratropium-Albuterol (COMBIVENT RESPIMAT) 20-100 MCG/ACT AERS respimat Inhale 1 puff into the lungs every 8 (eight) hours.   Yes Historical Provider, MD  ipratropium-albuterol (DUONEB) 0.5-2.5 (3) MG/3ML SOLN Take 3 mLs by nebulization 3 (three) times daily as needed. Dx: chronic COPD 08/25/14  Yes JForest Gleason MD  LORazepam (ATIVAN) 0.5 MG tablet Take 0.5 mg by mouth every 4 (four) hours as needed for anxiety.   Yes Historical Provider, MD  methocarbamol (ROBAXIN) 500 MG tablet Take 500 mg by mouth every 6 (six) hours as needed for muscle spasms (cramps).   Yes Historical Provider, MD   Oxymetazoline HCl ([employer] NASAL SPRAY NA) Place 1 spray into the nose at bedtime as needed (for congestion).    Yes Historical Provider, MD  pantoprazole (PROTONIX) 20 MG tablet Take 20 mg by mouth daily.   Yes Historical Provider, MD  torsemide (DEMADEX) 5 MG tablet Take 5 mg by mouth daily.   Yes Historical Provider, MD  vitamin B-12 100 MCG tablet Take 1 tablet (100 mcg total) by mouth daily. 09/08/14  Yes Vishwanath Hande, MD  vitamin C (ASCORBIC ACID) 500 MG tablet Take 500 mg by mouth daily.   Yes Historical Provider, MD  cefUROXime (CEFTIN) 250 MG tablet Take 1 tablet (250 mg total) by mouth 2 (two) times daily with a meal. 09/08/14   Vishwanath Hande, MD  esomeprazole (NEXIUM) 20 MG capsule Take 20 mg by mouth daily.     Historical Provider, MD  furosemide (LASIX) 40 MG tablet Take 1 tablet (40 mg total) by mouth daily. 09/08/14   Vishwanath Hande, MD  predniSONE (DELTASONE) 20 MG tablet Prednisone 10 mg tabs  Take 2 tabs po once daily for 3 days Take 1 tab po  once daily for 3 days And then stop  Total: 18 tablets, 09/08/14   Vishwanath Hande, MD      VITAL SIGNS:  Blood pressure 104/55, pulse 96, temperature 97.6 F (36.4 C), temperature source Oral, resp. rate 20, height '5\' 1"'$  (1.549 m), weight 153 lb (69.4 kg), SpO2 100 %.  PHYSICAL EXAMINATION:  VITAL SIGNS: Filed Vitals:   09/27/14 0258  BP: 104/55  Pulse: 96  Temp:   Resp: 20   GENERAL:81 y.o.female currently in moderate acute distress. Given respiratory status requiring BiPAP therapy  HEAD: Normocephalic, atraumatic.  EYES: Pupils equal, round, reactive to light. Extraocular muscles intact. No scleral icterus.  MOUTH: Moist mucosal membrane. Dentition intact. No abscess noted.  EAR, NOSE, THROAT: Clear without exudates. No external lesions.  NECK: Supple. No thyromegaly. No nodules. No JVD.  PULMONARY: Diffuse coarse breath sounds with scattered rhonchi, scant expiratory wheeze, tachypneic on BiPAP CHEST: Nontender to  palpation.  CARDIOVASCULAR: S1 and S2. Regular rate and rhythm. No murmurs, rubs, or gallops. No edema. Pedal pulses 2+ bilaterally.  GASTROINTESTINAL: Soft, nontender, nondistended. No masses. Positive bowel sounds. No hepatosplenomegaly.  MUSCULOSKELETAL: No swelling, clubbing, or edema. Range of motion full in all extremities.  NEUROLOGIC: Cranial nerves II through XII are intact. No gross focal neurological deficits. Sensation intact. Reflexes intact.  SKIN: No ulceration, lesions, rashes, or cyanosis. Skin warm and dry. Turgor intact.  PSYCHIATRIC: Mood, affect within normal limits. The patient is awake, alert and oriented x 3. Insight, judgment intact.    LABORATORY PANEL:   CBC  Recent Labs Lab 09/27/14 0107  WBC 14.1*  HGB 7.7*  HCT 24.3*  PLT 232   ------------------------------------------------------------------------------------------------------------------  Chemistries   Recent Labs Lab 09/27/14 0107  NA 125*  K 5.1  CL 92*  CO2 25  GLUCOSE 216*  BUN 25*  CREATININE 1.05*  CALCIUM 8.8*  AST 21  ALT 26  ALKPHOS 53  BILITOT 0.7   ------------------------------------------------------------------------------------------------------------------  Cardiac Enzymes  Recent Labs Lab 09/27/14 0107  TROPONINI 0.03   ------------------------------------------------------------------------------------------------------------------  RADIOLOGY:  Dg Chest Portable 1 View  09/27/2014   CLINICAL DATA:  Dyspnea.  EXAM: PORTABLE CHEST - 1 VIEW  COMPARISON:  09/06/2014  FINDINGS: Confluent alveolar opacities are present in the central lung regions bilaterally, a substantial worsening from 09/06/2014. This may represent infectious infiltrate. Pulmonary hemorrhage may also produce this appearance. There may be a small right pleural effusion.  IMPRESSION: Development of extensive central alveolar opacities since 09/06/2014, likely infectious infiltrate. Probable small  right pleural effusion.   Electronically Signed   By: Andreas Newport M.D.   On: 09/27/2014 01:24    EKG:   Orders placed or performed during the hospital encounter of 09/27/14  . ED EKG  . ED EKG    IMPRESSION AND PLAN:   81 year old Caucasian female history of COPD and non-oxygen dependent presenting with shortness of breath.  1. Septic shock, meeting septic criteria by leukocytosis, tachypnea present on arrival. Source healthcare associated pneumonia  Panculture. Broad-spectrum antibiotics including vancomycin/aztreonam (given penicillin allergy) and taper antibiotics when culture data returns. She is receiving a 30 mL/kg IV fluid bolus. Continue IV fluid hydration to keep mean arterial pressure greater than 65. Given current blood pressure initiate vasopressin therapy norepinephrine. 2. Acute on chronic respiratory failure hypoxia: Secondary to above, BiPAP therapy, DuoNeb's, supplemental oxygen, wean BiPAP as tolerated 3. Hyponatremia: Normal saline infusion given current blood pressure, follow sodium level IV. Essential hypertension: Given current blood pressure hold medications 5. Venous  thromboembolism prophylactic: Heparin subcutaneous   All the records are reviewed and case discussed with ED provider. Management plans discussed with the patient, family and they are in agreement.  CODE STATUS: Full  TOTAL TIME TAKING CARE OF THIS PATIENT: 55 critical minutes.    Hower,  Karenann Cai.D on 09/27/2014 at 3:00 AM  Between 7am to 6pm - Pager - [phone]  After 6pm: House Pager: - [phone]  [facility]  Office  [phone]  CC: Primary care physician; [name], MD

## 2014-09-27 NOTE — Consult Note (Signed)
Palliative Medicine Inpatient Consult Note   Name: Jo Frank Date: 09/27/2014 MRN: [id number]  DOB: 12/22/1928  Referring Physician: [name], MD  Palliative Care consult requested for this 81 y.o. female for goals of medical therapy in patient with metastatic lung cancer and pneumonia.  She is not doing very well.  TODAY'S CONVERSATIONS, EVENTS, AND PLAN: Pt readily agrees with DNR and also DNI.  She is able to make her own healthcare decisions.  Her granddaughter was present but pt made her own decisions without contribution or input from her granddaughter.  Granddaughter was in agreement with this very rational decision.   REVIEW OF SYSTEMS:  Patient is not able to provide ROS --too ill  SPIRITUAL SUPPORT SYSTEM: Yes.  SOCIAL HISTORY:  reports that she quit smoking about 35 years ago. Her smoking use included Cigarettes. She has a 15 pack-year smoking history. She has never used smokeless tobacco. She reports that she does not drink alcohol or use illicit drugs.  LEGAL DOCUMENTS:  There is reported to be a HCPOA form at pts home 'somewhere'.  Pt can make her own decisions at this time however.   CODE STATUS: DNR  As of now.  Pt readily agrees with DNR and DNI  PAST MEDICAL HISTORY: Past Medical History  Diagnosis Date  . Hypertension   . CVA (cerebral infarction) 2010    Was on Plavix for 2 years, then switched to clopidogrel but d/c due to side effects ("sick")  . Nephrolithiasis     per patient, passes on her own  . Stroke   . COPD (chronic obstructive pulmonary disease)   . GERD (gastroesophageal reflux disease)   . Breast cancer 2013    breast cancer right-radiation-lumpectomy  . Lung cancer     PAST SURGICAL HISTORY:  Past Surgical History  Procedure Laterality Date  . Breast lumpectomy      left side  . Abdominal hysterectomy  1960s    "bleeding profusely"  . Cholecystectomy    . Hand surgery      s/p fall    ALLERGIES:  is allergic to aspirin;  penicillins; and nitrofuran derivatives.  MEDICATIONS:  Current Facility-Administered Medications  Medication Dose Route Frequency [place] Last Rate Last Dose  . 0.9 %  sodium chloride infusion   Intravenous Continuous Lytle Butte, MD 125 mL/hr at 09/27/14 1705    . acetaminophen (TYLENOL) tablet 650 mg  650 mg Oral Q6H PRN Lytle Butte, MD       Or  . acetaminophen (TYLENOL) suppository 650 mg  650 mg Rectal Q6H PRN Lytle Butte, MD      . aluminum-magnesium hydroxide 200-200 MG/5ML suspension 30 mL  30 mL Oral Q4H PRN Lytle Butte, MD      . aztreonam (AZACTAM) 2 g in dextrose 5 % 50 mL IVPB  2 g Intravenous 3 times per day Lytle Butte, MD   2 g at 09/27/14 1553  . budesonide (PULMICORT) nebulizer solution 0.25 mg  0.25 mg Nebulization 4 times per day Wilhelmina Mcardle, MD   0.25 mg at 09/27/14 1220  . clopidogrel (PLAVIX) tablet 75 mg  75 mg Oral Q breakfast Lytle Butte, MD   75 mg at 09/27/14 1036  . feeding supplement (PRO-STAT SUGAR FREE 64) liquid 30 mL  30 mL Oral BID Lytle Butte, MD   30 mL at 09/27/14 1040  . ferrous sulfate tablet 325 mg  325 mg Oral BID Lytle Butte, MD  325 mg at 09/27/14 1040  . gabapentin (NEURONTIN) capsule 100 mg  100 mg Oral Daily Lytle Butte, MD   100 mg at 09/27/14 1039  . guaiFENesin-codeine 100-10 MG/5ML solution 5 mL  5 mL Oral Q6H PRN Lytle Butte, MD      . heparin injection 5,000 Units  5,000 Units Subcutaneous 3 times per day Lytle Butte, MD   5,000 Units at 09/27/14 1553  . ipratropium-albuterol (DUONEB) 0.5-2.5 (3) MG/3ML nebulizer solution 3 mL  3 mL Nebulization Q6H Wilhelmina Mcardle, MD      . LORazepam (ATIVAN) tablet 0.5 mg  0.5 mg Oral Q4H PRN Lytle Butte, MD      . methocarbamol (ROBAXIN) tablet 500 mg  500 mg Oral Q6H PRN Lytle Butte, MD      . methylPREDNISolone sodium succinate (SOLU-MEDROL) 125 mg/2 mL injection 60 mg  60 mg Intravenous Q24H Lytle Butte, MD   60 mg at 09/27/14 0546  . morphine 2 MG/ML injection 2 mg   2 mg Intravenous Q3H PRN Colleen Can, MD      . norepinephrine (LEVOPHED) '4mg'$  in D5W 234m premix infusion  0-40 mcg/min Intravenous Titrated DWilhelmina Mcardle MD   Stopped at 09/27/14 1700  . ondansetron (ZOFRAN) tablet 4 mg  4 mg Oral Q6H PRN DLytle Butte MD       Or  . ondansetron ([facility] injection 4 mg  4 mg Intravenous Q6H PRN DLytle Butte MD      . pantoprazole (PROTONIX) EC tablet 40 mg  40 mg Oral QAC breakfast DLytle Butte MD   40 mg at 09/27/14 1038  . sodium chloride 0.9 % injection 3 mL  3 mL Intravenous Q12H DLytle Butte MD   3 mL at 09/27/14 0338  . vancomycin (VANCOCIN) IVPB 1000 mg/200 mL premix  1,000 mg Intravenous Q24H DLytle Butte MD   1,000 mg at 09/27/14 1756  . vitamin B-12 (CYANOCOBALAMIN) tablet 100 mcg  100 mcg Oral Daily DLytle Butte MD   100 mcg at 09/27/14 1038  . vitamin C (ASCORBIC ACID) tablet 500 mg  500 mg Oral Daily DLytle Butte MD   500 mg at 09/27/14 1038    Vital Signs: BP 138/68 mmHg  Pulse 113  Temp(Src) 98.4 F (36.9 C) (Oral)  Resp 18  Ht '5\' 1"'$  (1.549 m)  Wt 69.4 kg (153 lb)  BMI 28.92 kg/m2  SpO2 93% Filed Weights   09/27/14 0105  Weight: 69.4 kg (153 lb)    Estimated body mass index is 28.92 kg/(m^2) as calculated from the following:   Height as of this encounter: '5\' 1"'$  (1.549 m).   Weight as of this encounter: 69.4 kg (153 lb).  PERFORMANCE STATUS (ECOG) : 4 - Bedbound  PHYSICAL EXAM: Mild to moderate respiratory distress on nasal cannula oxygen EOMI OP clear No JVD Hrt rrr no mgr Lungs with ronchi and rales throughout Abd soft and NT Ext no mottling   LABS: CBC:    Component Value Date/Time   WBC 14.8* 09/27/2014 0747   WBC 5.2 01/01/2012 0956   HGB 7.3* 09/27/2014 0747   HGB 12.4 01/01/2012 0956   HCT 23.6* 09/27/2014 0747   HCT 36.3 01/01/2012 0956   PLT 207 09/27/2014 0747   PLT 219 01/01/2012 0956   MCV 90.5 09/27/2014 0747   MCV 93 01/01/2012 0956   NEUTROABS 12.9* 09/27/2014 0107    NEUTROABS 3.4 01/01/2012 0956  LYMPHSABS 0.3* 09/27/2014 0107   LYMPHSABS 0.9* 01/01/2012 0956   MONOABS 0.8 09/27/2014 0107   MONOABS 0.6 01/01/2012 0956   EOSABS 0.0 09/27/2014 0107   EOSABS 0.2 01/01/2012 0956   BASOSABS 0.0 09/27/2014 0107   BASOSABS 0.1 01/01/2012 0956   Comprehensive Metabolic Panel:    Component Value Date/Time   NA 128* 09/27/2014 0747   K 4.8 09/27/2014 0747   CL 97* 09/27/2014 0747   CO2 24 09/27/2014 0747   BUN 26* 09/27/2014 0747   CREATININE 1.13* 09/27/2014 0747   CREATININE 1.44* 03/21/2011 1558   GLUCOSE 152* 09/27/2014 0747   CALCIUM 8.6* 09/27/2014 0747   AST 21 09/27/2014 0107   ALT 26 09/27/2014 0107   ALKPHOS 53 09/27/2014 0107   BILITOT 0.7 09/27/2014 0107   PROT 5.7* 09/27/2014 0107   ALBUMIN 2.9* 09/27/2014 0107    IMPRESSION: Metestatic lung cancer Presumed pneumonia Septic shock Acute on chronic respiratory failure HTN Known kidney stones COPD GERD History of prior breast cancer (right with radiation and lumpectomy) Hyponatremia     See top of note for plan      More than 50% of the visit was spent in counseling/coordination of care: Yes  Time Spent: 60 minutes

## 2014-09-27 NOTE — ED Notes (Signed)
Pt arrived to the ED from [facility] via EMS for shortness of breath. Pt has been receiving IV antibiotics for treatment for pneumonia, when began to experience shortness of breath. Pt arrived to the ED on BIPAP, Dr. Owens Shark at bedside. Pt is AOx4 in moderate respiratory distress.

## 2014-09-27 NOTE — ED Notes (Signed)
Dr. Owens Shark notified of Pt having difficulty breathing. Fluids were discontinued per Dr. Owens Shark.

## 2014-09-27 NOTE — Progress Notes (Addendum)
ANTIBIOTIC CONSULT NOTE - INITIAL  Pharmacy Consult for vancomycin, aztreonam Indication: pneumonia  Allergies  Allergen Reactions  . Aspirin Shortness Of Breath, Swelling and Other (See Comments)    Pt states that she vomits blood.   Marland Kitchen Penicillins Anaphylaxis  . Nitrofuran Derivatives Nausea And Vomiting    Patient Measurements: Height: '5\' 1"'$  (154.9 cm) Weight: 153 lb (69.4 kg) IBW/kg (Calculated) : 47.8 Adjusted Body Weight: 56 kg  Vital Signs: Temp: 97.6 F (36.4 C) (08/30 0105) Temp Source: Oral (08/30 0105) BP: 85/41 mmHg (08/30 0200) Pulse Rate: 103 (08/30 0200) Intake/Output from previous day:   Intake/Output from this shift:    Labs:  Recent Labs  09/26/14 0745 09/27/14 0107  WBC 9.3 14.1*  HGB 7.9* 7.7*  PLT 202 232  CREATININE 0.98 1.05*   Estimated Creatinine Clearance: 35.5 mL/min (by C-G formula based on Cr of 1.05). No results for input(s): VANCOTROUGH, VANCOPEAK, VANCORANDOM, GENTTROUGH, GENTPEAK, GENTRANDOM, TOBRATROUGH, TOBRAPEAK, TOBRARND, AMIKACINPEAK, AMIKACINTROU, AMIKACIN in the last 72 hours.   Microbiology: Recent Results (from the past 720 hour(s))  Blood culture (routine x 2)     Status: None   Collection Time: 08/31/14  4:52 PM  Result Value Ref Range Status   Specimen Description BLOOD LEFT ASSIST CONTROL  Final   Special Requests BOTTLES DRAWN AEROBIC AND ANAEROBIC 3ML  Final   Culture NO GROWTH 5 DAYS  Final   Report Status 09/05/2014 FINAL  Final  Blood culture (routine x 2)     Status: None   Collection Time: 08/31/14  4:52 PM  Result Value Ref Range Status   Specimen Description BLOOD RIGHT ASSIST CONTROL  Final   Special Requests BOTTLES DRAWN AEROBIC AND ANAEROBIC 3ML  Final   Culture NO GROWTH 5 DAYS  Final   Report Status 09/05/2014 FINAL  Final  Culture, expectorated sputum-assessment     Status: None   Collection Time: 09/02/14  3:23 PM  Result Value Ref Range Status   Specimen Description EXPECTORATED SPUTUM  Final    Special Requests NONE  Final   Sputum evaluation THIS SPECIMEN IS ACCEPTABLE FOR SPUTUM CULTURE  Final   Report Status 09/02/2014 FINAL  Final  Culture, respiratory (NON-Expectorated)     Status: None   Collection Time: 09/02/14  3:23 PM  Result Value Ref Range Status   Specimen Description EXPECTORATED SPUTUM  Final   Special Requests NONE Reflexed from [id number]  Final   Gram Stain   Final    GOOD SPECIMEN - 80-90% WBCS MODERATE WBC SEEN MODERATE GRAM POSITIVE COCCI IN CLUSTERS IN PAIRS FEW GRAM NEGATIVE RODS FEW GRAM NEGATIVE COCCOBACILLI    Culture APPEARS TO BE NORMAL FLORA  Final   Report Status 09/05/2014 FINAL  Final    Medical History: Past Medical History  Diagnosis Date  . Hypertension   . CVA (cerebral infarction) 2010    Was on Plavix for 2 years, then switched to clopidogrel but d/c due to side effects ("sick")  . Nephrolithiasis     per patient, passes on her own  . Stroke   . COPD (chronic obstructive pulmonary disease)   . GERD (gastroesophageal reflux disease)   . Breast cancer 2013    breast cancer right-radiation-lumpectomy  . Lung cancer     Medications:  Infusions:  . sodium chloride    . aztreonam    . levofloxacin (LEVAQUIN) IV 500 mg (09/27/14 0231)  . norepinephrine    . sodium chloride    . vancomycin  Assessment: 86 yof cc SOB has been on IV abx for PNA. Resident of [place] starting on triple therapy for HCAP.  Vd 48.5 L, Ke 0.0339 hr-1, T1/2 20.5 hr, Predicted trough 17 mcg/mL  Goal of Therapy:  Vancomycin trough level 15-20 mcg/ml  Plan:  Expected duration 7 days with resolution of temperature and/or normalization of WBC. Aztreonam 2 gm IV Q8H for anaphylaxis to penicillins and vancomycin 1 gm IV Q24H with stacked dosing second dose 10 hours after first will draw level before fourth dose and adjust as needed to maintain trough 15 to 20 mcg/mL.  Laural Benes, Pharm.D. Clinical Pharmacist 09/27/2014,2:40 AM

## 2014-09-27 NOTE — ED Provider Notes (Signed)
[facility] Emergency Department Provider Note  ____________________________________________  Time seen: 12:50 AM  I have reviewed the triage vital signs and the nursing notes.   HISTORY  Chief Complaint Shortness of Breath      HPI Jo Frank is a 81 y.o. female presents via EMS in respiratory distress on BiPAP. Per EMS patient recently diagnosed with pneumonia receiving IV antibiotics at nursing facility. However over the course today patient expansion progressive dyspnea. Patient arrived to the emergency department in a tripod position with BiPAP in place breathing approximately 42 times per minute. Of note patient has a history of recently diagnosed with congestive heart failure as well as COPD.     Past Medical History  Diagnosis Date  . Hypertension   . CVA (cerebral infarction) 2010    Was on Plavix for 2 years, then switched to clopidogrel but d/c due to side effects ("sick")  . Nephrolithiasis     per patient, passes on her own  . Stroke   . COPD (chronic obstructive pulmonary disease)   . GERD (gastroesophageal reflux disease)   . Breast cancer 2013    breast cancer right-radiation-lumpectomy  . Lung cancer     Patient Active Problem List   Diagnosis Date Noted  . COPD exacerbation   . Malnutrition of moderate degree 09/02/2014  . Metastatic lung cancer (metastasis from lung to other site)   . CHF (congestive heart failure) 08/31/2014  . Abdominal pain 08/12/2014  . Absolute anemia 08/08/2014  . Back pain, chronic 08/08/2014  . Breast CA 08/08/2014  . CAFL (chronic airflow limitation) 08/08/2014  . Acid reflux 08/08/2014  . Personal history of transient ischemic attack (TIA) and cerebral infarction without residual deficit 08/08/2014  . HLD (hyperlipidemia) 08/08/2014  . Low serum cobalamin 08/08/2014  . Amnesia 08/08/2014  . Syncope 12/06/2012  . Hypertension 12/06/2012    Past Surgical History  Procedure Laterality Date   . Breast lumpectomy      left side  . Abdominal hysterectomy  1960s    "bleeding profusely"  . Cholecystectomy    . Hand surgery      s/p fall    Current Outpatient Rx  Name  Route  Sig  Dispense  Refill  . acetaminophen (TYLENOL) 500 MG tablet   Oral   Take 1,000 mg by mouth every 6 (six) hours as needed for mild pain or headache.          . albuterol (PROVENTIL HFA;VENTOLIN HFA) 108 (90 BASE) MCG/ACT inhaler   Inhalation   Inhale 1-2 puffs into the lungs every 4 (four) hours as needed for wheezing or shortness of breath.   1 Inhaler   3   . budesonide-formoterol (SYMBICORT) 160-4.5 MCG/ACT inhaler   Inhalation   Inhale 2 puffs into the lungs 2 (two) times daily.         . cefUROXime (CEFTIN) 250 MG tablet   Oral   Take 1 tablet (250 mg total) by mouth 2 (two) times daily with a meal.   20 tablet   0   . clopidogrel (PLAVIX) 75 MG tablet   Oral   Take 1 tablet (75 mg total) by mouth daily with breakfast.   30 tablet   3   . diltiazem (CARDIZEM CD) 120 MG 24 hr capsule   Oral   Take 1 capsule (120 mg total) by mouth daily.   30 capsule   5   . esomeprazole (NEXIUM) 20 MG capsule   Oral  Take 20 mg by mouth daily.          . furosemide (LASIX) 40 MG tablet   Oral   Take 1 tablet (40 mg total) by mouth daily.   30 tablet   3   . guaiFENesin-codeine 100-10 MG/5ML syrup   Oral   Take 5 mLs by mouth every 6 (six) hours as needed for cough.   120 mL   0   . ipratropium-albuterol (DUONEB) 0.5-2.5 (3) MG/3ML SOLN   Nebulization   Take 3 mLs by nebulization 3 (three) times daily as needed. Dx: chronic COPD Patient taking differently: Take 3 mLs by nebulization 3 (three) times daily as needed (for shortness of breath). Dx: chronic COPD   360 mL   3   . losartan (COZAAR) 50 MG tablet   Oral   Take 50 mg by mouth daily.         . Oxymetazoline HCl ([employer] NASAL SPRAY NA)   Nasal   Place 1 spray into the nose at bedtime as needed (for congestion).           . predniSONE (DELTASONE) 20 MG tablet      Prednisone 10 mg tabs  Take 2 tabs po once daily for 3 days Take 1 tab po once daily for 3 days And then stop  Total: 18 tablets,   9 tablet   0   . vitamin B-12 100 MCG tablet   Oral   Take 1 tablet (100 mcg total) by mouth daily.   30 tablet   3     Allergies Aspirin; Penicillins; and Nitrofuran derivatives  Family History  Problem Relation Age of Onset  . Throat cancer Mother     heavy smoker & drinker, died at 79 yo (1978)  . Heart failure Father   . Hypertension Sister   . Stroke Sister     Social History Social History  Substance Use Topics  . Smoking status: Former Smoker -- 0.50 packs/day for 30 years    Types: Cigarettes    Quit date: 12/07/1978  . Smokeless tobacco: Never Used  . Alcohol Use: No    Review of Systems  Constitutional: Negative for fever. Eyes: Negative for visual changes. ENT: Negative for sore throat. Cardiovascular: Negative for chest pain. Respiratory: positive for respiratory distress andshortness of breath. Gastrointestinal: Negative for abdominal pain, vomiting and diarrhea. Genitourinary: Negative for dysuria. Musculoskeletal: Negative for back pain. Skin: Negative for rash. Neurological: Negative for headaches, focal weakness or numbness.   10-point ROS otherwise negative.  ____________________________________________   PHYSICAL EXAM:  VITAL SIGNS: ED Triage Vitals  Enc Vitals Group     BP --      Pulse --      Resp --      Temp --      Temp src --      SpO2 09/27/14 0101 98 %     Weight --      Height --      Head Cir --      Peak Flow --      Pain Score --      Pain Loc --      Pain Edu? --    Constitutional: Alert, Apparent respiratory distress Eyes: Conjunctivae are normal. PERRL. Normal extraocular movements. ENT   Head: Normocephalic and atraumatic.   Nose: No congestion/rhinnorhea.   Mouth/Throat: Mucous membranes are moist.    Neck: No stridor. Hematological/Lymphatic/Immunilogical: No cervical lymphadenopathy. Cardiovascular: Normal rate, regular rhythm. Normal  and symmetric distal pulses are present in all extremities. No murmurs, rubs, or gallops. Respirattachypnea diffuse rhonchi with rales bibasilar. Gastrointestinal: Soft and nontender. No distention. There is no CVA tenderness. Genitourinary: deferred Musculoskeletal: Nontender with normal range of motion in all extremities. No joint effusions.  No lower extremity tenderness nor edema. Neurologic:  Normal speech and language. No gross focal neurologic deficits are appreciated. Speech is normal.  Skin:  Skin is warm, dry and intact. No rash noted. Psychiatric: Mood and affect are normal. Speech and behavior are normal. Patient exhibits appropriate insight and judgment.  ____________________________________________    LABS (pertinent positives/negatives)  Labs Reviewed  CBC WITH DIFFERENTIAL/PLATELET - Abnormal; Notable for the following:    WBC 14.1 (*)    RBC 2.80 (*)    Hemoglobin 7.7 (*)    HCT 24.3 (*)    MCHC 31.7 (*)    RDW 19.2 (*)    Neutro Abs 12.9 (*)    Lymphs Abs 0.3 (*)    All other components within normal limits  COMPREHENSIVE METABOLIC PANEL - Abnormal; Notable for the following:    Sodium 125 (*)    Chloride 92 (*)    Glucose, Bld 216 (*)    BUN 25 (*)    Creatinine, Ser 1.05 (*)    Calcium 8.8 (*)    Total Protein 5.7 (*)    Albumin 2.9 (*)    GFR calc non Af Amer 47 (*)    GFR calc Af Amer 55 (*)    All other components within normal limits  BRAIN NATRIURETIC PEPTIDE - Abnormal; Notable for the following:    B Natriuretic Peptide 600.0 (*)    All other components within normal limits  BASIC METABOLIC PANEL - Abnormal; Notable for the following:    Sodium 128 (*)    Chloride 97 (*)    Glucose, Bld 152 (*)    BUN 26 (*)    Creatinine, Ser 1.13 (*)    Calcium 8.6 (*)    GFR calc non Af Amer 43 (*)    GFR calc Af Amer  50 (*)    All other components within normal limits  CBC - Abnormal; Notable for the following:    WBC 14.8 (*)    RBC 2.61 (*)    Hemoglobin 7.3 (*)    HCT 23.6 (*)    MCHC 30.8 (*)    RDW 20.3 (*)    All other components within normal limits  GLUCOSE, CAPILLARY - Abnormal; Notable for the following:    Glucose-Capillary 147 (*)    All other components within normal limits  BRAIN NATRIURETIC PEPTIDE - Abnormal; Notable for the following:    B Natriuretic Peptide 621.0 (*)    All other components within normal limits  CBC - Abnormal; Notable for the following:    WBC 12.0 (*)    RBC 2.51 (*)    Hemoglobin 6.8 (*)    HCT 22.0 (*)    MCHC 31.2 (*)    RDW 19.6 (*)    All other components within normal limits  COMPREHENSIVE METABOLIC PANEL - Abnormal; Notable for the following:    Sodium 128 (*)    Potassium 5.3 (*)    Chloride 100 (*)    Glucose, Bld 112 (*)    BUN 26 (*)    Calcium 8.4 (*)    Total Protein 5.0 (*)    Albumin 2.6 (*)    GFR calc non Af Amer 57 (*)  Anion gap 4 (*)    All other components within normal limits  CULTURE, BLOOD (ROUTINE X 2)  CULTURE, BLOOD (ROUTINE X 2)  MRSA PCR SCREENING  TROPONIN I  PROCALCITONIN     ____________________________________________   EKG ED ECG REPORT I, Ailani Governale, [place], the attending physician, personally viewed and interpreted this ECG.   Date: 09/30/2014  EKG Time: 1:02 AM  Rate: 107  Rhythm: Sinus tachycardia  Axis: None  Intervals: Normal  ST&T Change: None    ____________________________________________    RADIOLOGY  DG Chest Port 1 View (Final result) Result time: 09/28/14 07:10:58   Final result by Rad Results In Interface (09/28/14 07:10:58)   Narrative:   CLINICAL DATA: Follow-up respiratory failure, sepsis, history of COPD, breast and lung malignancy.  EXAM: PORTABLE CHEST - 1 VIEW  COMPARISON: Portable chest x-ray of September 27, 2014  FINDINGS: The lungs are well-expanded.  Confluent fluffy alveolar opacities are present bilaterally and are little changed. The cardiac silhouette is mildly enlarged but stable. The pulmonary vascularity is indistinct. There is a small left pleural effusion. The mediastinum is normal in width. The bony thorax exhibits no acute abnormality.  IMPRESSION: Stable bilateral airspace opacities most compatible with pneumonia. Pulmonary edema and ARDS are in the differential however. A known right upper lobe mass is not clearly evident due to overlying opacities.   Electronically Signed By: David Martinique M.D. On: 09/28/2014 07:10          DG Chest Portable 1 View (Final result) Result time: 09/27/14 01:24:34   Final result by Rad Results In Interface (09/27/14 01:24:34)   Narrative:   CLINICAL DATA: Dyspnea.  EXAM: PORTABLE CHEST - 1 VIEW  COMPARISON: 09/06/2014  FINDINGS: Confluent alveolar opacities are present in the central lung regions bilaterally, a substantial worsening from 09/06/2014. This may represent infectious infiltrate. Pulmonary hemorrhage may also produce this appearance. There may be a small right pleural effusion.  IMPRESSION: Development of extensive central alveolar opacities since 09/06/2014, likely infectious infiltrate. Probable small right pleural effusion.   Electronically Signed By: Andreas Newport M.D. On: 09/27/2014 01:24      Critical Care performed: CRITICAL CARE Performed by: Marjean Donna N   Total critical care time: 31mnutes  Critical care time was exclusive of separately billable procedures and treating other patients.  Critical care was necessary to treat or prevent imminent or life-threatening deterioration.  Critical care was time spent personally by me on the following activities: development of treatment plan with patient and/or surrogate as well as nursing, discussions with consultants, evaluation of patient's response to treatment,  examination of patient, obtaining history from patient or surrogate, ordering and performing treatments and interventions, ordering and review of laboratory studies, ordering and review of radiographic studies, pulse oximetry and re-evaluation of patient's condition.  ____________________________________________   INITIAL IMPRESSION / ASSESSMENT AND PLAN / ED COURSE  Pertinent labs & imaging results that were available during my care of the patient were reviewed by me and considered in my medical decision making (see chart for details).  BiPAP applied to the patient secondary to rest or distress/failure. In addition patient received multiple DuoNeb's through the BiPAP apparatus. Patient also received IV antibiotics. Patient discussed with hospitalist staff for hospitalization admission for respiratory failure and pneumonia  ____________________________________________   FINAL CLINICAL IMPRESSION(S) / ED DIAGNOSES  Final diagnoses:  Respiratory failure  Acute on chronic respiratory failure with hypoxia      RGregor Hams MD 09/30/14 [phone]

## 2014-09-27 NOTE — Consult Note (Signed)
PULMONARY/CCM CONSULT NOTE  Date of admission: 8/30 Date of consult: 8/30 Reason for consultation:  HCAP, hypotension  Pt Profile:  81 F with recently diagnosed adenocarcinoma of lung with liver mets has undergone one round of chemotherapy 2 wks PTA. Was adm from rehab facility with dx of HCAP and hypotension.     HPI:  81 F with recently diagnosed adenocarcinoma of lung with liver mets has undergone one round of chemotherapy 2 wks PTA. Was adm from rehab facility with dx of HCAP and hypotension. She described 2-3 day duration shortness of breath with associated cough, productive-brownish sputum, denied fevers or chills. She was admitted to ICU/SDU on NPPV and norepi was initiated for hypotension. At the time of this evaluation, she was on [place] O2 and comfortable and norepi was being weaned to off.  Past Medical History  Diagnosis Date  . Hypertension   . CVA (cerebral infarction) 2010    Was on Plavix for 2 years, then switched to clopidogrel but d/c due to side effects ("sick")  . Nephrolithiasis     per patient, passes on her own  . Stroke   . COPD (chronic obstructive pulmonary disease)   . GERD (gastroesophageal reflux disease)   . Breast cancer 2013    breast cancer right-radiation-lumpectomy  . Lung cancer     MEDICATIONS: reviewed  Social History   Social History  . Marital Status: Married    Spouse Name: N/A  . Number of Children: 2  . Years of Education: N/A   Occupational History  . Secretary     Retired   Social History Main Topics  . Smoking status: Former Smoker -- 0.50 packs/day for 30 years    Types: Cigarettes    Quit date: 12/07/1978  . Smokeless tobacco: Never Used  . Alcohol Use: No  . Drug Use: No  . Sexual Activity: Not on file   Other Topics Concern  . Not on file   Social History Narrative   Lives with husband who has Parkinson's disease, who she cares for    Family History  Problem Relation Age of Onset  . Throat cancer Mother    heavy smoker & drinker, died at 79 yo (1978)  . Heart failure Father   . Hypertension Sister   . Stroke Sister     ROS - No CP, hemoptysis, LE edema, calf tenderness, N/V/D, adb pain, dysuira, change in bowel pattern  Filed Vitals:   09/27/14 1100 09/27/14 1200 09/27/14 1300 09/27/14 1400  BP: 118/59 120/57 133/66 122/58  Pulse: 98 103 113 111  Temp:  98 F (36.7 C)    TempSrc:  Oral    Resp: '12 31 25 '$ 33  Height:      Weight:      SpO2: 100% 99% 96% 96%    EXAM:  Gen: Ple[place], WDWN, NAD on [place] O2 HEENT: NCAT, WNL Neck: No JVD noted, no LAN, no TM noted Lungs: Few scattered coarse wheezes and rhonchi Cardiovascular: tachy, reg, no M noted Abdomen: soft, NT, +BS Ext: no C/C/E Neuro: CNs intact, no focal deficits  DATA:  BMET    Component Value Date/Time   NA 128* 09/27/2014 0747   K 4.8 09/27/2014 0747   CL 97* 09/27/2014 0747   CO2 24 09/27/2014 0747   GLUCOSE 152* 09/27/2014 0747   BUN 26* 09/27/2014 0747   CREATININE 1.13* 09/27/2014 0747   CREATININE 1.44* 03/21/2011 1558   CALCIUM 8.6* 09/27/2014 0747   GFRNONAA 43* 09/27/2014 0747  GFRNONAA 37* 03/21/2011 1558   GFRAA 50* 09/27/2014 0747   GFRAA 45* 03/21/2011 1558    CBC    Component Value Date/Time   WBC 14.8* 09/27/2014 0747   WBC 5.2 01/01/2012 0956   RBC 2.61* 09/27/2014 0747   RBC 3.93 01/01/2012 0956   HGB 7.3* 09/27/2014 0747   HGB 12.4 01/01/2012 0956   HCT 23.6* 09/27/2014 0747   HCT 36.3 01/01/2012 0956   PLT 207 09/27/2014 0747   PLT 219 01/01/2012 0956   MCV 90.5 09/27/2014 0747   MCV 93 01/01/2012 0956   MCH 27.9 09/27/2014 0747   MCH 31.6 01/01/2012 0956   MCHC 30.8* 09/27/2014 0747   MCHC 34.2 01/01/2012 0956   RDW 20.3* 09/27/2014 0747   RDW 14.0 01/01/2012 0956   LYMPHSABS 0.3* 09/27/2014 0107   LYMPHSABS 0.9* 01/01/2012 0956   MONOABS 0.8 09/27/2014 0107   MONOABS 0.6 01/01/2012 0956   EOSABS 0.0 09/27/2014 0107   EOSABS 0.2 01/01/2012 0956   BASOSABS 0.0 09/27/2014  0107   BASOSABS 0.1 01/01/2012 0956    CXR: bilateral airspace disease  IMPRESSION:   Recent diagnosis of adenocarcinoma of the lung with metastases to liver Probable HCAP Hypotension - likely septic in origin. Improving  PLAN:  Agree with current abx F/U on cx results Wean NE to off as tolerated for MAP > 60 mmHg I initiated goals of care discussion in light of an advanced malignancy and her advanced age. At present, she indicates that she would wish to forgo intubation and ACLS if such circumstances were to arise. I told her that I would like for her to have this discussion with her other family members and have asked for assistance from [facility] - consultation has been placed   Merton Border, MD PCCM service Mobile [phone] Pager [phone]

## 2014-09-28 ENCOUNTER — Inpatient Hospital Stay: Payer: PPO

## 2014-09-28 DIAGNOSIS — Z515 Encounter for palliative care: Secondary | ICD-10-CM

## 2014-09-28 DIAGNOSIS — J9620 Acute and chronic respiratory failure, unspecified whether with hypoxia or hypercapnia: Secondary | ICD-10-CM

## 2014-09-28 DIAGNOSIS — C3490 Malignant neoplasm of unspecified part of unspecified bronchus or lung: Secondary | ICD-10-CM

## 2014-09-28 DIAGNOSIS — R6521 Severe sepsis with septic shock: Secondary | ICD-10-CM

## 2014-09-28 DIAGNOSIS — Z853 Personal history of malignant neoplasm of breast: Secondary | ICD-10-CM

## 2014-09-28 DIAGNOSIS — Z87891 Personal history of nicotine dependence: Secondary | ICD-10-CM

## 2014-09-28 DIAGNOSIS — C787 Secondary malignant neoplasm of liver and intrahepatic bile duct: Secondary | ICD-10-CM

## 2014-09-28 DIAGNOSIS — Z8673 Personal history of transient ischemic attack (TIA), and cerebral infarction without residual deficits: Secondary | ICD-10-CM

## 2014-09-28 DIAGNOSIS — K219 Gastro-esophageal reflux disease without esophagitis: Secondary | ICD-10-CM

## 2014-09-28 DIAGNOSIS — I10 Essential (primary) hypertension: Secondary | ICD-10-CM

## 2014-09-28 DIAGNOSIS — Z800 Family history of malignant neoplasm of digestive organs: Secondary | ICD-10-CM

## 2014-09-28 DIAGNOSIS — J189 Pneumonia, unspecified organism: Secondary | ICD-10-CM

## 2014-09-28 DIAGNOSIS — J449 Chronic obstructive pulmonary disease, unspecified: Secondary | ICD-10-CM

## 2014-09-28 DIAGNOSIS — Z87442 Personal history of urinary calculi: Secondary | ICD-10-CM

## 2014-09-28 DIAGNOSIS — Z66 Do not resuscitate: Secondary | ICD-10-CM

## 2014-09-28 DIAGNOSIS — E871 Hypo-osmolality and hyponatremia: Secondary | ICD-10-CM

## 2014-09-28 DIAGNOSIS — A419 Sepsis, unspecified organism: Principal | ICD-10-CM

## 2014-09-28 LAB — CBC
HCT: 22 % — ABNORMAL LOW (ref 35.0–47.0)
HEMOGLOBIN: 6.8 g/dL — AB (ref 12.0–16.0)
MCH: 27.3 pg (ref 26.0–34.0)
MCHC: 31.2 g/dL — AB (ref 32.0–36.0)
MCV: 87.6 fL (ref 80.0–100.0)
PLATELETS: 198 10*3/uL (ref 150–440)
RBC: 2.51 MIL/uL — ABNORMAL LOW (ref 3.80–5.20)
RDW: 19.6 % — AB (ref 11.5–14.5)
WBC: 12 10*3/uL — ABNORMAL HIGH (ref 3.6–11.0)

## 2014-09-28 LAB — COMPREHENSIVE METABOLIC PANEL
ALBUMIN: 2.6 g/dL — AB (ref 3.5–5.0)
ALK PHOS: 45 U/L (ref 38–126)
ALT: 22 U/L (ref 14–54)
ANION GAP: 4 — AB (ref 5–15)
AST: 18 U/L (ref 15–41)
BUN: 26 mg/dL — ABNORMAL HIGH (ref 6–20)
CHLORIDE: 100 mmol/L — AB (ref 101–111)
CO2: 24 mmol/L (ref 22–32)
Calcium: 8.4 mg/dL — ABNORMAL LOW (ref 8.9–10.3)
Creatinine, Ser: 0.9 mg/dL (ref 0.44–1.00)
GFR calc Af Amer: >60 mL/min (ref >60)
GFR calc non Af Amer: 57 mL/min — ABNORMAL LOW (ref >60)
GLUCOSE: 112 mg/dL — AB (ref 65–99)
POTASSIUM: 5.3 mmol/L — AB (ref 3.5–5.1)
SODIUM: 128 mmol/L — AB (ref 135–145)
Total Bilirubin: 0.6 mg/dL (ref 0.3–1.2)
Total Protein: 5 g/dL — ABNORMAL LOW (ref 6.5–8.1)

## 2014-09-28 MED ORDER — METHYLPREDNISOLONE SODIUM SUCC 125 MG IJ SOLR
60.0000 mg | Freq: Three times a day (TID) | INTRAMUSCULAR | Status: DC
  Administered 2014-09-28 (×2): 60 mg via INTRAVENOUS
  Filled 2014-09-28 (×2): qty 2

## 2014-09-28 MED ORDER — MORPHINE SULFATE (CONCENTRATE) 10 MG /0.5 ML PO SOLN: 10.0000 mg | ORAL | Status: AC | PRN

## 2014-09-28 MED ORDER — MORPHINE SULFATE (PF) 2 MG/ML IV SOLN
2.0000 mg | Freq: Once | INTRAVENOUS | Status: AC
  Administered 2014-09-28: 2 mg via INTRAVENOUS

## 2014-09-28 MED ORDER — BISACODYL 10 MG RE SUPP: 10.0000 mg | RECTAL | Status: AC | PRN

## 2014-09-28 MED ORDER — MORPHINE SULFATE (CONCENTRATE) 10 MG/0.5ML PO SOLN: 5.0000 mg | ORAL | Status: AC | PRN

## 2014-09-28 MED ORDER — LORAZEPAM 0.5 MG PO TABS: 0.5000 mg | ORAL_TABLET | ORAL | Status: AC | PRN

## 2014-09-28 MED ORDER — METOPROLOL TARTRATE 1 MG/ML IV SOLN
2.5000 mg | INTRAVENOUS | Status: DC | PRN
Start: 2014-09-28 — End: 2014-09-28
  Filled 2014-09-28: qty 5

## 2014-09-28 MED ORDER — ENOXAPARIN SODIUM 40 MG/0.4ML ~~LOC~~ SOLN
40.0000 mg | SUBCUTANEOUS | Status: DC
  Administered 2014-09-28: 40 mg via SUBCUTANEOUS
  Filled 2014-09-28: qty 0.4

## 2014-09-28 MED ORDER — ENSURE ENLIVE PO LIQD: 237.0000 mL | Freq: Two times a day (BID) | ORAL | Status: DC

## 2014-09-28 MED ORDER — ALBUTEROL SULFATE (2.5 MG/3ML) 0.083% IN NEBU: 2.5000 mg | INHALATION_SOLUTION | RESPIRATORY_TRACT | Status: DC | PRN

## 2014-09-28 MED ORDER — PROCHLORPERAZINE 25 MG RE SUPP: 25.0000 mg | Freq: Two times a day (BID) | RECTAL | Status: AC | PRN

## 2014-09-28 MED ORDER — SODIUM CHLORIDE 0.9 % IV SOLN: Freq: Once | INTRAVENOUS | Status: DC

## 2014-09-28 NOTE — Consult Note (Signed)
PULMONARY/CCM CONSULT NOTE  Date of admission: 8/30 Date of consult: 8/30 Reason for consultation:  HCAP, hypotension  Pt Profile:  45 F with recently diagnosed adenocarcinoma of lung with liver mets has undergone one round of chemotherapy 2 wks PTA. Was adm from rehab facility with dx of HCAP and hypotension.   SUBJ: Remains BiPAP dependent though it seems more psychological rather than physiological. No new complaints   Filed Vitals:   09/28/14 0900 09/28/14 1000 09/28/14 1100 09/28/14 1200  BP:  151/74  142/74  Pulse: 141 105 115 142  Temp:  98.2 F (36.8 C)    TempSrc:  Tympanic    Resp: '21 22 20 23  '$ Height:      Weight:      SpO2: 99% 92% 99% 100%    EXAM:  Gen: Pleasant, WDWN, NAD  HEENT: NCAT, WNL Neck: No JVD noted, no LAN, no TM noted Lungs: Few scattered coarse wheezes and rales Cardiovascular: tachy, reg, no M noted Abdomen: soft, NT, +BS Ext: no C/C/E Neuro: CNs intact, no focal deficits  DATA:  BMET    Component Value Date/Time   NA 128* 09/28/2014 0450   K 5.3* 09/28/2014 0450   CL 100* 09/28/2014 0450   CO2 24 09/28/2014 0450   GLUCOSE 112* 09/28/2014 0450   BUN 26* 09/28/2014 0450   CREATININE 0.90 09/28/2014 0450   CREATININE 1.44* 03/21/2011 1558   CALCIUM 8.4* 09/28/2014 0450   GFRNONAA 57* 09/28/2014 0450   GFRNONAA 37* 03/21/2011 1558   GFRAA >60 09/28/2014 0450   GFRAA 45* 03/21/2011 1558    CBC    Component Value Date/Time   WBC 12.0* 09/28/2014 0450   WBC 5.2 01/01/2012 0956   RBC 2.51* 09/28/2014 0450   RBC 3.93 01/01/2012 0956   HGB 6.8* 09/28/2014 0450   HGB 12.4 01/01/2012 0956   HCT 22.0* 09/28/2014 0450   HCT 36.3 01/01/2012 0956   PLT 198 09/28/2014 0450   PLT 219 01/01/2012 0956   MCV 87.6 09/28/2014 0450   MCV 93 01/01/2012 0956   MCH 27.3 09/28/2014 0450   MCH 31.6 01/01/2012 0956   MCHC 31.2* 09/28/2014 0450   MCHC 34.2 01/01/2012 0956   RDW 19.6* 09/28/2014 0450   RDW 14.0 01/01/2012 0956   LYMPHSABS 0.3*  09/27/2014 0107   LYMPHSABS 0.9* 01/01/2012 0956   MONOABS 0.8 09/27/2014 0107   MONOABS 0.6 01/01/2012 0956   EOSABS 0.0 09/27/2014 0107   EOSABS 0.2 01/01/2012 0956   BASOSABS 0.0 09/27/2014 0107   BASOSABS 0.1 01/01/2012 0956    CXR: opacities. The opacities on L are somewhat nodular in appearance raising concern for metastatic disease  IMPRESSION:   Recent diagnosis of adenocarcinoma of the lung with metastases to liver Probable HCAP. Consider possibility that this represents progressive cancer rather than PNA Hypotension, resolved  PLAN:  Plans for discharge with Hospice noted. I fully concur with this as I am inclined to think that we are seeing progressive cancer, at least as part of the explanation for her deterioration and acute presentation. If unable to come off BiPAP, would consider titrated morphine to relieve dyspnea. Cont PRN BiPAP as palliative measure for now. Abx per primary team. Levofloxacin would be a reasonable choice after discharge to home. Would complete 10 days. I do not think that she is a candidate for further chemotherapy  PCCM will sign off. Please call if we can be of further assistance   Merton Border, MD PCCM service Mobile [phone] Pager  [phone]  

## 2014-09-28 NOTE — Discharge Summary (Signed)
Physician Discharge Summary  Jo Frank JJH:[id number] DOB: 1929/01/10 DOA: 09/27/2014  PCP: [name], MD  Admit date: 09/27/2014 Discharge date: 09/28/2014  Time spent: 35  minutes  Recommendations for Outpatient Follow-up:  1. D/c to Hospice Home  Discharge Diagnoses:  1 Acute on Chronic respiratory Failure secondary to COPD exacerbation and Pneumonia 2 Metastatic Lung Ca  3 Sepsis 4 Hyponatremia 5 Anemia    Discharge Condition: Critical  Diet recommendation:  Filed Weights   09/27/14 0105  Weight: 69.4 kg (153 lb)    History of present illness:  Jo Frank is a 81 year old female with a history of COPD, metastatic lung CA,HTN, who recently been discharged to rehabilitation presented the ED complaining of acute shortness of breath.. Patient was also notably hypotensive and hypoxemic. And improved some extent after being placed on BiPAP  Hospital Course:   She was admitted to the CCU, Needed pressor support and did reasonably well for a period of time but with recurrent episodes of shortness of breath and to be placed back on BiPAP Patient had chest x-ray evidence of bilateral airspace opacities cardiac silhouette was mildly enlarged there was a small left pleural effusion. It was felt the airspace opacities most compatible with pneumonia although pulmonary edema and ARDS could not be excluded. A known right upper lobe mass was not clearly evident due to overlying opacities Patient's hemoglobin is low at 6.8. Was 26 creatinine 0.9 and Serum albumin is 2.6 Patient was treated with IV antibiotics including vancomycin and IV aztreonam. She was also treated with intravenous steroids, SVN's  supplement oxygen and BiPAP. However she was unable to come off the BiPAP. He was seen in consultation by palliative care specialist. She is a DO NOT RESUSCITATE After discussion with family patient was transferred to hospice home for comfort  care   Consultations:  Palliative Care  Oncology- Dr. Oliva Bustard  Discharge Exam: Filed Vitals:   09/28/14 1200  BP: 142/74  Pulse: 142  Temp:   Resp: 23    General: In Respiratory distress Cardiovascular: S1 S2 Respiratory: Crackles bilat   Discharge Instructions    Current Discharge Medication List    CONTINUE these medications which have NOT CHANGED   Details  acetaminophen (TYLENOL) 500 MG tablet Take 1,000 mg by mouth every 6 (six) hours as needed for mild pain or headache.     albuterol (PROVENTIL HFA;VENTOLIN HFA) 108 (90 BASE) MCG/ACT inhaler Inhale 1-2 puffs into the lungs every 4 (four) hours as needed for wheezing or shortness of breath. Qty: 1 Inhaler, Refills: 3   Associated Diagnoses: Shortness of breath    aluminum-magnesium hydroxide 200-200 MG/5ML suspension Take 30 mLs by mouth every 4 (four) hours as needed for indigestion. Strength should be '480mg'$ -'480mg'$ -48/73m    Amino Acids-Protein Hydrolys (FEEDING SUPPLEMENT, PRO-STAT SUGAR FREE 64,) LIQD Take 30 mLs by mouth 2 (two) times daily. Between meals    budesonide-formoterol (SYMBICORT) 160-4.5 MCG/ACT inhaler Inhale 2 puffs into the lungs 2 (two) times daily.    clindamycin in dextrose 5 % 50 mL Inject 600 mg into the vein 2 (two) times daily.    clopidogrel (PLAVIX) 75 MG tablet Take 1 tablet (75 mg total) by mouth daily with breakfast. Qty: 30 tablet, Refills: 3    diltiazem (CARDIZEM CD) 120 MG 24 hr capsule Take 1 capsule (120 mg total) by mouth daily. Qty: 30 capsule, Refills: 5    ferrous sulfate 325 (65 FE) MG tablet Take 325 mg by mouth 2 (two) times daily.  gabapentin (NEURONTIN) 100 MG capsule Take 100 mg by mouth daily.    guaiFENesin-codeine 100-10 MG/5ML syrup Take 5 mLs by mouth every 6 (six) hours as needed for cough. Qty: 120 mL, Refills: 0    Ipratropium-Albuterol (COMBIVENT RESPIMAT) 20-100 MCG/ACT AERS respimat Inhale 1 puff into the lungs every 8 (eight) hours.     ipratropium-albuterol (DUONEB) 0.5-2.5 (3) MG/3ML SOLN Take 3 mLs by nebulization 3 (three) times daily as needed. Dx: chronic COPD Qty: 360 mL, Refills: 3   Associated Diagnoses: Liver mass; SOB (shortness of breath); Chronic obstructive pulmonary disease with acute exacerbation    LORazepam (ATIVAN) 0.5 MG tablet Take 0.5 mg by mouth every 4 (four) hours as needed for anxiety.    methocarbamol (ROBAXIN) 500 MG tablet Take 500 mg by mouth every 6 (six) hours as needed for muscle spasms (cramps).    Oxymetazoline HCl ([employer] NASAL SPRAY NA) Place 1 spray into the nose at bedtime as needed (for congestion).     pantoprazole (PROTONIX) 20 MG tablet Take 20 mg by mouth daily.    torsemide (DEMADEX) 5 MG tablet Take 5 mg by mouth daily.    vitamin B-12 100 MCG tablet Take 1 tablet (100 mcg total) by mouth daily. Qty: 30 tablet, Refills: 3    vitamin C (ASCORBIC ACID) 500 MG tablet Take 500 mg by mouth daily.    cefUROXime (CEFTIN) 250 MG tablet Take 1 tablet (250 mg total) by mouth 2 (two) times daily with a meal. Qty: 20 tablet, Refills: 0    esomeprazole (NEXIUM) 20 MG capsule Take 20 mg by mouth daily.     furosemide (LASIX) 40 MG tablet Take 1 tablet (40 mg total) by mouth daily. Qty: 30 tablet, Refills: 3    predniSONE (DELTASONE) 20 MG tablet Prednisone 10 mg tabs  Take 2 tabs po once daily for 3 days Take 1 tab po once daily for 3 days And then stop  Total: 18 tablets, Qty: 9 tablet, Refills: 0       Allergies  Allergen Reactions  . Aspirin Shortness Of Breath, Swelling and Other (See Comments)    Pt states that she vomits blood.   Marland Kitchen Penicillins Anaphylaxis  . Nitrofuran Derivatives Nausea And Vomiting      The results of significant diagnostics from this hospitalization (including imaging, microbiology, ancillary and laboratory) are listed below for reference.    Significant Diagnostic Studies: Dg Chest 1 View  09/06/2014   CLINICAL DATA:  Known history of lung  carcinoma with shortness of Breath  EXAM: CHEST  1 VIEW  COMPARISON:  09/01/2014  FINDINGS: Persistent right upper lobe mass lesion is identified. Improved aeration is noted in the right lung base left lung remains clear. The cardiac shadow is stable. No acute bony abnormality is noted.  IMPRESSION: Stable right upper lobe mass lesion. Improved aeration in the right lung base is noted.   Electronically Signed   By: Inez Catalina M.D.   On: 09/06/2014 13:38   Dg Chest 1 View  09/01/2014   CLINICAL DATA:  Acute onset of dyspnea.  Initial encounter.  EXAM: CHEST  1 VIEW  COMPARISON:  Chest radiograph performed 08/31/2014  FINDINGS: Bilateral central airspace opacification is perhaps slightly worsened from the prior study and concerning for pulmonary edema. Small bilateral pleural effusions are noted. No pneumothorax is seen.  The cardiomediastinal silhouette is borderline enlarged. No acute osseous abnormalities are identified.  IMPRESSION: Slightly worsening pulmonary edema noted, with small bilateral pleural effusions. Borderline  cardiomegaly.   Electronically Signed   By: Garald Balding M.D.   On: 09/01/2014 01:33   Dg Chest Port 1 View  09/28/2014   CLINICAL DATA:  Follow-up respiratory failure, sepsis, history of COPD, breast and lung malignancy.  EXAM: PORTABLE CHEST - 1 VIEW  COMPARISON:  Portable chest x-ray of September 27, 2014  FINDINGS: The lungs are well-expanded. Confluent fluffy alveolar opacities are present bilaterally and are little changed. The cardiac silhouette is mildly enlarged but stable. The pulmonary vascularity is indistinct. There is a small left pleural effusion. The mediastinum is normal in width. The bony thorax exhibits no acute abnormality.  IMPRESSION: Stable bilateral airspace opacities most compatible with pneumonia. Pulmonary edema and ARDS are in the differential however. A known right upper lobe mass is not clearly evident due to overlying opacities.   Electronically Signed   By:  David  Martinique M.D.   On: 09/28/2014 07:10   Dg Chest Portable 1 View  09/27/2014   CLINICAL DATA:  Dyspnea.  EXAM: PORTABLE CHEST - 1 VIEW  COMPARISON:  09/06/2014  FINDINGS: Confluent alveolar opacities are present in the central lung regions bilaterally, a substantial worsening from 09/06/2014. This may represent infectious infiltrate. Pulmonary hemorrhage may also produce this appearance. There may be a small right pleural effusion.  IMPRESSION: Development of extensive central alveolar opacities since 09/06/2014, likely infectious infiltrate. Probable small right pleural effusion.   Electronically Signed   By: Andreas Newport M.D.   On: 09/27/2014 01:24   Dg Chest Port 1 View  09/01/2014   CLINICAL DATA:  History of hypertension stroke COPD gastroesophageal reflux and breast cancer, recently diagnosed with metastatic lung cancer involving the liver for the past 2-3 weeks with progressive worsening shortness of breath over that time  EXAM: PORTABLE CHEST - 1 VIEW  COMPARISON:  09/01/2014 at 1:18  FINDINGS: Stable mild cardiac enlargement with central vascular congestion. Extensive bilateral perihilar hazy opacities. Small pleural effusions. Interstitial prominence.  IMPRESSION: Congestive heart failure with moderately severe pulmonary edema stable from prior study   Electronically Signed   By: Skipper Cliche M.D.   On: 09/01/2014 08:27   Dg Chest Portable 1 View  08/31/2014   CLINICAL DATA:  Progressive shortness of breath. History of lung cancer. Liver cancer. Breast cancer. COPD.  EXAM: PORTABLE CHEST - 1 VIEW  COMPARISON:  CT of the chest 08/18/2014.  FINDINGS: The heart is enlarged. Mild edema is present. Bilateral effusions are noted. Bibasilar airspace disease is evident. Nodular disease in the right upper lobe is partially obscured by edema.  IMPRESSION: 1. Cardiomegaly with mild edema and bilateral effusions compatible with congestive heart failure. 2. Right upper lobe nodule.   Electronically  Signed   By: San Morelle M.D.   On: 08/31/2014 17:14    Microbiology: Recent Results (from the past 240 hour(s))  Blood culture (routine x 2)     Status: None (Preliminary result)   Collection Time: 09/27/14  1:58 AM  Result Value Ref Range Status   Specimen Description BLOOD LEFT ANTECUBITAL  Final   Special Requests BOTTLES DRAWN AEROBIC AND ANAEROBIC 7ML  Final   Culture NO GROWTH 1 DAY  Final   Report Status PENDING  Incomplete  Blood culture (routine x 2)     Status: None (Preliminary result)   Collection Time: 09/27/14  1:58 AM  Result Value Ref Range Status   Specimen Description BLOOD BLOOD LEFT FOREARM  Final   Special Requests BOTTLES DRAWN AEROBIC AND ANAEROBIC  5ML  Final   Culture NO GROWTH 1 DAY  Final   Report Status PENDING  Incomplete  MRSA PCR Screening     Status: None   Collection Time: 09/27/14  4:55 AM  Result Value Ref Range Status   MRSA by PCR NEGATIVE NEGATIVE Final    Comment:        The GeneXpert MRSA Assay (FDA approved for NASAL specimens only), is one component of a comprehensive MRSA colonization surveillance program. It is not intended to diagnose MRSA infection nor to guide or monitor treatment for MRSA infections.      Labs: Basic Metabolic Panel:  Recent Labs Lab 09/23/14 1500 09/26/14 0745 09/27/14 0107 09/27/14 0747 09/28/14 0450  NA 132* 127* 125* 128* 128*  K 5.3* 5.5* 5.1 4.8 5.3*  CL 97* 94* 92* 97* 100*  CO2 '25 26 25 24 24  '$ GLUCOSE 124* 183* 216* 152* 112*  BUN 25* 22* 25* 26* 26*  CREATININE 1.20* 0.98 1.05* 1.13* 0.90  CALCIUM 8.7* 8.4* 8.8* 8.6* 8.4*   Liver Function Tests:  Recent Labs Lab 09/23/14 1500 09/27/14 0107 09/28/14 0450  AST 32 21 18  ALT 38 26 22  ALKPHOS 53 53 45  BILITOT 0.1* 0.7 0.6  PROT 5.4* 5.7* 5.0*  ALBUMIN 2.8* 2.9* 2.6*   No results for input(s): LIPASE, AMYLASE in the last 168 hours. No results for input(s): AMMONIA in the last 168 hours. CBC:  Recent Labs Lab  09/22/14 0813 09/23/14 1500 09/26/14 0745 09/27/14 0107 09/27/14 0747 09/28/14 0450  WBC 6.4 7.1 9.3 14.1* 14.8* 12.0*  NEUTROABS 4.2 6.4 9.1* 12.9*  --   --   HGB 8.2* 7.4* 7.9* 7.7* 7.3* 6.8*  HCT 25.9* 23.4* 24.9* 24.3* 23.6* 22.0*  MCV 86.9 87.2 88.0 86.8 90.5 87.6  PLT 177 192 202 232 207 198   Cardiac Enzymes:  Recent Labs Lab 09/27/14 0107  TROPONINI 0.03   BNP: BNP (last 3 results)  Recent Labs  08/31/14 1651 09/27/14 0108 09/27/14 1350  BNP 733.0* 600.0* 621.0*    ProBNP (last 3 results) No results for input(s): PROBNP in the last 8760 hours.  CBG:  Recent Labs Lab 09/27/14 0448  GLUCAP 147*       Signed:  Myrel Rappleye   09/28/2014, 1:03 PM

## 2014-09-28 NOTE — Progress Notes (Signed)
PROGRESS NOTE  Jo Frank GQQ:[id number] DOB: 10/08/1928 DOA: 09/27/2014 PCP: [name], MD  Subjective 81 y/o f with hx of COPD,HTN, Metastatic Lung ca, recently discharged to [facility]  readmitted with Acute shortness of breath. Pt back on BIPAP this am     Objective: BP 119/58 mmHg  Pulse 89  Temp(Src) 97.8 F (36.6 C) (Axillary)  Resp 16  Ht '5\' 1"'$  (1.549 m)  Wt 69.4 kg (153 lb)  BMI 28.92 kg/m2  SpO2 97%  Intake/Output Summary (Last 24 hours) at 09/28/14 6712 Last data filed at 09/28/14 0500  Gross per 24 hour  Intake 1812.83 ml  Output   1575 ml  Net 237.83 ml   Filed Weights   09/27/14 0105  Weight: 69.4 kg (153 lb)    Exam:  General: Elderly  female - in Moderate distress On BIPAP HEENT: PERRL; OP moist without lesions. Neck: supple, trachea midline, no thyromegaly Chest: normal to palpation Lungs:Crackles + bilat -more left upper lobe area Cardiovascular: Tachycardic-RRR, no murmur, no gallop Abdomen: soft, nontender, nondistended, positive bowel sounds Extremities: no clubbing, cyanosis, edema Neuro: alert and oriented, moves all extremities Derm: no significant rashes or nodules; good skin turgor Lymph: no cervical or supraclavicular lymphadenopathy   Labs and imaging studies were reviewed*  Data Reviewed: Basic Metabolic Panel:  Recent Labs Lab 09/23/14 1500 09/26/14 0745 09/27/14 0107 09/27/14 0747 09/28/14 0450  NA 132* 127* 125* 128* 128*  K 5.3* 5.5* 5.1 4.8 5.3*  CL 97* 94* 92* 97* 100*  CO2 '25 26 25 24 24  '$ GLUCOSE 124* 183* 216* 152* 112*  BUN 25* 22* 25* 26* 26*  CREATININE 1.20* 0.98 1.05* 1.13* 0.90  CALCIUM 8.7* 8.4* 8.8* 8.6* 8.4*   Liver Function Tests:  Recent Labs Lab 09/23/14 1500 09/27/14 0107 09/28/14 0450  AST 32 21 18  ALT 38 26 22  ALKPHOS 53 53 45  BILITOT 0.1* 0.7 0.6  PROT 5.4* 5.7* 5.0*  ALBUMIN 2.8* 2.9* 2.6*   No results for input(s): LIPASE, AMYLASE in the last 168 hours. No  results for input(s): AMMONIA in the last 168 hours. CBC:  Recent Labs Lab 09/22/14 0813 09/23/14 1500 09/26/14 0745 09/27/14 0107 09/27/14 0747 09/28/14 0450  WBC 6.4 7.1 9.3 14.1* 14.8* 12.0*  NEUTROABS 4.2 6.4 9.1* 12.9*  --   --   HGB 8.2* 7.4* 7.9* 7.7* 7.3* 6.8*  HCT 25.9* 23.4* 24.9* 24.3* 23.6* 22.0*  MCV 86.9 87.2 88.0 86.8 90.5 87.6  PLT 177 192 202 232 207 198   Cardiac Enzymes:    Recent Labs Lab 09/27/14 0107  TROPONINI 0.03   BNP (last 3 results)  Recent Labs  08/31/14 1651 09/27/14 0108 09/27/14 1350  BNP 733.0* 600.0* 621.0*    ProBNP (last 3 results) No results for input(s): PROBNP in the last 8760 hours.  CBG:  Recent Labs Lab 09/27/14 0448  GLUCAP 147*    Recent Results (from the past 240 hour(s))  Blood culture (routine x 2)     Status: None (Preliminary result)   Collection Time: 09/27/14  1:58 AM  Result Value Ref Range Status   Specimen Description BLOOD LEFT ANTECUBITAL  Final   Special Requests BOTTLES DRAWN AEROBIC AND ANAEROBIC 7ML  Final   Culture NO GROWTH < 12 HOURS  Final   Report Status PENDING  Incomplete  Blood culture (routine x 2)     Status: None (Preliminary result)   Collection Time: 09/27/14  1:58 AM  Result Value Ref  Range Status   Specimen Description BLOOD BLOOD LEFT FOREARM  Final   Special Requests BOTTLES DRAWN AEROBIC AND ANAEROBIC 5ML  Final   Culture NO GROWTH < 12 HOURS  Final   Report Status PENDING  Incomplete  MRSA PCR Screening     Status: None   Collection Time: 09/27/14  4:55 AM  Result Value Ref Range Status   MRSA by PCR NEGATIVE NEGATIVE Final    Comment:        The GeneXpert MRSA Assay (FDA approved for NASAL specimens only), is one component of a comprehensive MRSA colonization surveillance program. It is not intended to diagnose MRSA infection nor to guide or monitor treatment for MRSA infections.      Studies: Dg Chest Port 1 View  09/28/2014   CLINICAL DATA:  Follow-up  respiratory failure, sepsis, history of COPD, breast and lung malignancy.  EXAM: PORTABLE CHEST - 1 VIEW  COMPARISON:  Portable chest x-ray of September 27, 2014  FINDINGS: The lungs are well-expanded. Confluent fluffy alveolar opacities are present bilaterally and are little changed. The cardiac silhouette is mildly enlarged but stable. The pulmonary vascularity is indistinct. There is a small left pleural effusion. The mediastinum is normal in width. The bony thorax exhibits no acute abnormality.  IMPRESSION: Stable bilateral airspace opacities most compatible with pneumonia. Pulmonary edema and ARDS are in the differential however. A known right upper lobe mass is not clearly evident due to overlying opacities.   Electronically Signed   By: David  Martinique M.D.   On: 09/28/2014 07:10    Scheduled Meds: . aztreonam  2 g Intravenous 3 times per day  . budesonide  0.25 mg Nebulization 4 times per day  . clopidogrel  75 mg Oral Q breakfast  . feeding supplement (PRO-STAT SUGAR FREE 64)  30 mL Oral BID  . ferrous sulfate  325 mg Oral BID  . gabapentin  100 mg Oral Daily  . heparin  5,000 Units Subcutaneous 3 times per day  . ipratropium-albuterol  3 mL Nebulization Q6H  . methylPREDNISolone (SOLU-MEDROL) injection  60 mg Intravenous Q24H  . pantoprazole  40 mg Oral QAC breakfast  . sodium chloride  3 mL Intravenous Q12H  . vancomycin  1,000 mg Intravenous Q24H  . vitamin B-12  100 mcg Oral Daily  . vitamin C  500 mg Oral Daily    Continuous Infusions: . sodium chloride 75 mL/hr at 09/28/14 0713  . norepinephrine Stopped (09/27/14 1700)    Assessment/Plan:   1  Acute on chronic respiratory failure with hypoxia secondary to COPD exacerbation and Pneumonia. Pulmonary edema or ARDS  Not ruled out Continue O2, SVN's , IV Solumedrol and IV Antibiotics(Aztreonam and Vanc) Currently on BIPAP. Consult Pulmonary 2 Metastatic lung ca; Recently started on Chemo Consult Oncology . Pt may benefit from  radiation therapy 3 Septic Shock: BP improving- On  pressors - wean down as appropriate. 4 HTN: BP meds on hold 5 Hx of CVA: On Plavix  6 Anemia: Consider transfusion 7 Hyponatremia:Continue to monitor  Code Status: Full  Family Communication: Grandaughter        09/28/2014, 8:12 AM  LOS: 1 day

## 2014-09-28 NOTE — Progress Notes (Signed)
New hospice home referral received from Palliative Care physician Dr. Megan Salon. Jo Frank is an 81 year old woman with a history COPD, lung ca w/liver mets (7/16) s/p one round of chemo, Breast cancer (9/13), GERD, CVA and HTN, who came to the [facility] ED from [facility] for evaluation of worsening shortness of breath and productive cough. She has required Bipap for worsening respiratory status. Patient is alert and oriented, she met with Palliative Care physician Dr. Megan Salon and has chosen to go to the hospice home for symptom management/end of life care. She is DNR. Writer met with patient and her granddaughter Jo Frank in her room, she was at that time on 5 liters of oxygen and struggling with severe anxiety and dyspnea. She made clear her desire for comfort at the hospice home. She was placed back on the Bipap and was medicated with 2 mg of IV morphine and lorazepam 0.5 mg po. Hospice services explained, consents signed, at patient request, by her granddaughter Jo Frank. Patient information.notes and signed consents faxed to referral intake. Bipap has been ordered for delivery to the hospice home. Of note patient's husband is currently at the hospice home, she would like to be able to see him. Plan is for patient to transfer today via EMS with portable DNR in place. Patient will transport on 5 liters of oxygen via nasal cannula, she will be premedicated per physicians orders prior to transport. Family and patient in agreement. Staff RN Margreta Journey, [facility] Lattie Haw. CSW Jo Frank, attending physician Dr. Ginette Pitman and Dr.Campbell all aware of discharge plan. PLEASE SEND BIPAP MASK WITH PATIENT AT DISCHARGE. Report called to hospice home, EMS notified for pick up. Thank you for the opportunity to serve [name] and her family. Flo Shanks RN, BSN, [facility] and [facility] of [place], [facility] [phone] c

## 2014-09-28 NOTE — Progress Notes (Signed)
Initial Nutrition Assessment       INTERVENTION:  Meals and snacks: Cater to pt preferences Nutrition Supplement Therapy: Recommend Ensure Enlive po BID, each supplement provides 350 kcal and 20 grams of protein   NUTRITION DIAGNOSIS:   Inadequate oral intake related to acute illness as evidenced by per patient/family report.    GOAL:   Patient will meet greater than or equal to 90% of their needs    MONITOR:    (Energy intake, pulmonary profile)  REASON FOR ASSESSMENT:   Other (Comment), Consult Poor PO  ASSESSMENT:      Pt admitted with acute respiratory failure secondary to COPD exacerbation and pneumonia on bipap  Past Medical History  Diagnosis Date  . Hypertension   . CVA (cerebral infarction) 2010    Was on Plavix for 2 years, then switched to clopidogrel but d/c due to side effects ("sick")  . Nephrolithiasis     per patient, passes on her own  . Stroke   . COPD (chronic obstructive pulmonary disease)   . GERD (gastroesophageal reflux disease)   . Breast cancer 2013    breast cancer right-radiation-lumpectomy  . Lung cancer     Current Nutrition: ate few bites of breakfast this am  Food/Nutrition-Related History: Pt reports poor po intake for the past week prior to admission secondary to respiratory failure.  Has been at rehab and drinking ensure    Medications: levophed, vit b12 and c, prostat, fe sulfate, solumedrol  Electrolyte/Renal Profile and Glucose Profile:   Recent Labs Lab 09/27/14 0107 09/27/14 0747 09/28/14 0450  NA 125* 128* 128*  K 5.1 4.8 5.3*  CL 92* 97* 100*  CO2 '25 24 24  '$ BUN 25* 26* 26*  CREATININE 1.05* 1.13* 0.90  CALCIUM 8.8* 8.6* 8.4*  GLUCOSE 216* 152* 112*   Protein Profile:  Recent Labs Lab 09/23/14 1500 09/27/14 0107 09/28/14 0450  ALBUMIN 2.8* 2.9* 2.6*    Gastrointestinal Profile: Last BM:8/29   Nutrition-Focused Physical Exam Findings: Nutrition-Focused physical exam completed. Findings are  normal fat depletion, normal muscle depletion, and none edema.      Weight Change: stable weight per wt encounters    Diet Order:  Diet heart healthy/carb modified Room service appropriate?: Yes; Fluid consistency:: Thin  Skin:   reviewed    Height:   Ht Readings from Last 1 Encounters:  09/27/14 '5\' 1"'$  (1.549 m)    Weight:   Wt Readings from Last 1 Encounters:  09/27/14 153 lb (69.4 kg)    I BMI:  Body mass index is 28.92 kg/(m^2).  Estimated Nutritional Needs:   Kcal:  Using IBW of 48kg (BEE 867 kcals (IF 1.0-1.2, aF 1.3)   Protein:  Using IBW of 48kg (1.1-1.4 g/kg) 53-67 g/kg  Fluid:  (25-60m/kg) 1200-14430md  EDUCATION NEEDS:   No education needs identified at this time  MODERATE Care Level Wesleigh Markovic B. AlZenia ResidesRDDiamond RidgeLDInmanpager)

## 2014-09-28 NOTE — Plan of Care (Signed)
Problem: Phase II Progression Outcomes Goal: Progress activity as tolerated unless otherwise ordered Outcome: Progressing Pt unable to tolerate any kind of activity, will get very SOB, increase HR and agitation . It will take pt up to an hour to recover from any kind of activities or turning.

## 2014-09-28 NOTE — Progress Notes (Signed)
ANTIBIOTIC CONSULT NOTE - INITIAL  Pharmacy Consult for vancomycin, aztreonam Indication: pneumonia  Allergies  Allergen Reactions  . Aspirin Shortness Of Breath, Swelling and Other (See Comments)    Pt states that she vomits blood.   Marland Kitchen Penicillins Anaphylaxis  . Nitrofuran Derivatives Nausea And Vomiting    Patient Measurements: Height: '5\' 1"'$  (154.9 cm) Weight: 153 lb (69.4 kg) IBW/kg (Calculated) : 47.8 Adjusted Body Weight: 56 kg  Vital Signs: Temp: 97.8 F (36.6 C) (08/31 0400) Temp Source: Axillary (08/31 0400) BP: 119/58 mmHg (08/31 0400) Pulse Rate: 89 (08/31 0400) Intake/Output from previous day: 08/30 0701 - 08/31 0700 In: 1975.4 [P.O.:300; I.V.:1375.4; IV Piggyback:300] Out: 1575 [Urine:1575] Intake/Output from this shift:    Labs:  Recent Labs  09/27/14 0107 09/27/14 0747 09/28/14 0450  WBC 14.1* 14.8* 12.0*  HGB 7.7* 7.3* 6.8*  PLT 232 207 198  CREATININE 1.05* 1.13* 0.90   Estimated Creatinine Clearance: 41.4 mL/min (by C-G formula based on Cr of 0.9). No results for input(s): VANCOTROUGH, VANCOPEAK, VANCORANDOM, GENTTROUGH, GENTPEAK, GENTRANDOM, TOBRATROUGH, TOBRAPEAK, TOBRARND, AMIKACINPEAK, AMIKACINTROU, AMIKACIN in the last 72 hours.   Microbiology: Recent Results (from the past 720 hour(s))  Blood culture (routine x 2)     Status: None   Collection Time: 08/31/14  4:52 PM  Result Value Ref Range Status   Specimen Description BLOOD LEFT ASSIST CONTROL  Final   Special Requests BOTTLES DRAWN AEROBIC AND ANAEROBIC 3ML  Final   Culture NO GROWTH 5 DAYS  Final   Report Status 09/05/2014 FINAL  Final  Blood culture (routine x 2)     Status: None   Collection Time: 08/31/14  4:52 PM  Result Value Ref Range Status   Specimen Description BLOOD RIGHT ASSIST CONTROL  Final   Special Requests BOTTLES DRAWN AEROBIC AND ANAEROBIC 3ML  Final   Culture NO GROWTH 5 DAYS  Final   Report Status 09/05/2014 FINAL  Final  Culture, expectorated  sputum-assessment     Status: None   Collection Time: 09/02/14  3:23 PM  Result Value Ref Range Status   Specimen Description EXPECTORATED SPUTUM  Final   Special Requests NONE  Final   Sputum evaluation THIS SPECIMEN IS ACCEPTABLE FOR SPUTUM CULTURE  Final   Report Status 09/02/2014 FINAL  Final  Culture, respiratory (NON-Expectorated)     Status: None   Collection Time: 09/02/14  3:23 PM  Result Value Ref Range Status   Specimen Description EXPECTORATED SPUTUM  Final   Special Requests NONE Reflexed from [id number]  Final   Gram Stain   Final    GOOD SPECIMEN - 80-90% WBCS MODERATE WBC SEEN MODERATE GRAM POSITIVE COCCI IN CLUSTERS IN PAIRS FEW GRAM NEGATIVE RODS FEW GRAM NEGATIVE COCCOBACILLI    Culture APPEARS TO BE NORMAL FLORA  Final   Report Status 09/05/2014 FINAL  Final  Blood culture (routine x 2)     Status: None (Preliminary result)   Collection Time: 09/27/14  1:58 AM  Result Value Ref Range Status   Specimen Description BLOOD LEFT ANTECUBITAL  Final   Special Requests BOTTLES DRAWN AEROBIC AND ANAEROBIC 7ML  Final   Culture NO GROWTH 1 DAY  Final   Report Status PENDING  Incomplete  Blood culture (routine x 2)     Status: None (Preliminary result)   Collection Time: 09/27/14  1:58 AM  Result Value Ref Range Status   Specimen Description BLOOD BLOOD LEFT FOREARM  Final   Special Requests BOTTLES DRAWN AEROBIC AND ANAEROBIC 5ML  Final   Culture NO GROWTH 1 DAY  Final   Report Status PENDING  Incomplete  MRSA PCR Screening     Status: None   Collection Time: 09/27/14  4:55 AM  Result Value Ref Range Status   MRSA by PCR NEGATIVE NEGATIVE Final    Comment:        The GeneXpert MRSA Assay (FDA approved for NASAL specimens only), is one component of a comprehensive MRSA colonization surveillance program. It is not intended to diagnose MRSA infection nor to guide or monitor treatment for MRSA infections.     Medical History: Past Medical History  Diagnosis Date   . Hypertension   . CVA (cerebral infarction) 2010    Was on Plavix for 2 years, then switched to clopidogrel but d/c due to side effects ("sick")  . Nephrolithiasis     per patient, passes on her own  . Stroke   . COPD (chronic obstructive pulmonary disease)   . GERD (gastroesophageal reflux disease)   . Breast cancer 2013    breast cancer right-radiation-lumpectomy  . Lung cancer     Medications:  Infusions:  . norepinephrine Stopped (09/27/14 1700)   Assessment: 86 yof cc SOB has been on IV abx for PNA. Resident of [facility] starting on triple therapy for HCAP.  Goal of Therapy:  Vancomycin trough level 15-20 mcg/ml  Plan:  Expected duration 7 days with resolution of temperature and/or normalization of WBC.  Will continue vancomycin and aztreonam as ordered. Trough scheduled with the 4th dose of vancomycin tomorrow.    Ulice Dash D, Pharm.D. Clinical Pharmacist 09/28/2014,11:29 AM

## 2014-09-28 NOTE — Progress Notes (Signed)
Pt transferred to hospice home by EMS, grand daughter Caryl Pina with the pt at the time of transfer. Zero need voiced.

## 2014-09-28 NOTE — Progress Notes (Signed)
Palliative Medicine Inpatient Consult Follow Up Note   Name: Jo Frank Date: 09/28/2014 MRN: [id number]  DOB: 10-19-1928  Referring Physician: [name], MD  Palliative Care consult requested for this 81 y.o. female for goals of medical therapy in patient with metestatic lung cancer and pneumonia.  Dr Alva Garnet has reviewed pts imaging studies and has informed me that the patient has more extensive cancer and it is cancer, not so much pneumonia.    TODAY'S CONVERSATIONS, EVENTS, AND PLAN: 1.  Pt was offered different options related to Hospice Care after I explained to her that she is worse due to cancer spreading and not so much due to pneumonia. 2.  Pt is able to make her own healthcare decisions and she is choosing Hospice Home. 3.  Pts husband resides at [facility].  Her husband fell today --an eventful day. 4.  Pt does not feel she can be anywhere w/o BIPAP.  She technically seems to do OK on 5LPM nasal cannula oxygen, but she is not able to be off of BIPAP for long due to her fear of air hunger / smothering. She does not want to be medicated and taken off of BIPAP before going to [facility]. She wants to be awake enough to see her husband at [facility] and she wants the BIPAP.  Working out the arrangements for the BIPAP took several hours and it is being worked out.   5.  The pt's granddaughter was here and when the pts daughter arrived, I had a long talk with her. She was initially upset about not having been called earlier.  However, much was explained and she seemed at peace about pts choice to go to [facility] and about the DNR pt selected.   6.  I have personally spoken with Dr. Ginette Pitman and Dr Oliva Bustard and Dr. Alva Garnet and Hospice Liaison along with pt, pt's nurse, and care manager.  Prolonged visit. I did the DC med reconciliation list.   Pt is expected to go to [facility] today.     REVIEW OF SYSTEMS:  She does not feel she can tolerate being off of BIPAP --even though  she has demonstrated that she does OK on 5 LPM nasal cannula oxygen.    CODE STATUS: DNR   PAST MEDICAL HISTORY: Past Medical History  Diagnosis Date  . Hypertension   . CVA (cerebral infarction) 2010    Was on Plavix for 2 years, then switched to clopidogrel but d/c due to side effects ("sick")  . Nephrolithiasis     per patient, passes on her own  . Stroke   . COPD (chronic obstructive pulmonary disease)   . GERD (gastroesophageal reflux disease)   . Breast cancer 2013    breast cancer right-radiation-lumpectomy  . Lung cancer     PAST SURGICAL HISTORY:  Past Surgical History  Procedure Laterality Date  . Breast lumpectomy      left side  . Abdominal hysterectomy  1960s    "bleeding profusely"  . Cholecystectomy    . Hand surgery      s/p fall    Vital Signs: BP 142/74 mmHg  Pulse 142  Temp(Src) 98.2 F (36.8 C) (Tympanic)  Resp 23  Ht '5\' 1"'$  (1.549 m)  Wt 69.4 kg (153 lb)  BMI 28.92 kg/m2  SpO2 100% Filed Weights   09/27/14 0105  Weight: 69.4 kg (153 lb)    Estimated body mass index is 28.92 kg/(m^2) as calculated from the following:  Height as of this encounter: '5\' 1"'$  (1.549 m).   Weight as of this encounter: 69.4 kg (153 lb).  PHYSICAL EXAM: Mild distress but less so than last night --on BIPAP EOMI Fully alert OP clear Neck no JVD Hrt rrr tachy Lungs   LABS: CBC:    Component Value Date/Time   WBC 12.0* 09/28/2014 0450   WBC 5.2 01/01/2012 0956   HGB 6.8* 09/28/2014 0450   HGB 12.4 01/01/2012 0956   HCT 22.0* 09/28/2014 0450   HCT 36.3 01/01/2012 0956   PLT 198 09/28/2014 0450   PLT 219 01/01/2012 0956   MCV 87.6 09/28/2014 0450   MCV 93 01/01/2012 0956   NEUTROABS 12.9* 09/27/2014 0107   NEUTROABS 3.4 01/01/2012 0956   LYMPHSABS 0.3* 09/27/2014 0107   LYMPHSABS 0.9* 01/01/2012 0956   MONOABS 0.8 09/27/2014 0107   MONOABS 0.6 01/01/2012 0956   EOSABS 0.0 09/27/2014 0107   EOSABS 0.2 01/01/2012 0956   BASOSABS 0.0 09/27/2014 0107    BASOSABS 0.1 01/01/2012 0956   Comprehensive Metabolic Panel:    Component Value Date/Time   NA 128* 09/28/2014 0450   K 5.3* 09/28/2014 0450   CL 100* 09/28/2014 0450   CO2 24 09/28/2014 0450   BUN 26* 09/28/2014 0450   CREATININE 0.90 09/28/2014 0450   CREATININE 1.44* 03/21/2011 1558   GLUCOSE 112* 09/28/2014 0450   CALCIUM 8.4* 09/28/2014 0450   AST 18 09/28/2014 0450   ALT 22 09/28/2014 0450   ALKPHOS 45 09/28/2014 0450   BILITOT 0.6 09/28/2014 0450   PROT 5.0* 09/28/2014 0450   ALBUMIN 2.6* 09/28/2014 0450    IMPRESSION: Metestatic lung cancer Presumed pneumonia Septic shock Acute on chronic respiratory failure HTN Known kidney stones COPD GERD History of prior breast cancer (right with radiation and lumpectomy) Hyponatremia  See top of note for plan  REFERRALS TO BE ORDERED:  Hospice for Hospice Home   More than 50% of the visit was spent in counseling/coordination of care: YES  Time Spent:  60 min

## 2014-09-29 ENCOUNTER — Inpatient Hospital Stay: Payer: PPO

## 2014-09-29 ENCOUNTER — Inpatient Hospital Stay: Payer: PPO | Admitting: Oncology

## 2014-10-02 LAB — CULTURE, BLOOD (ROUTINE X 2)
CULTURE: NO GROWTH
Culture: NO GROWTH

## 2014-10-29 DEATH — deceased

## 2015-01-26 ENCOUNTER — Other Ambulatory Visit: Payer: Self-pay | Admitting: Nurse Practitioner

## 2016-07-31 IMAGING — CT NM PET TUM IMG INITIAL (PI) SKULL BASE T - THIGH
10 series · 24 of 25 positions shown · non-contrast
Comparison: Abdominal CT 08/12/2014.  Chest CT 07/29/2014.

CLINICAL DATA: Subsequent treatment strategy for liver lesion
biopsied on 08/12/2014. History of right breast cancer in 9953 with
last radiation therapy in 6826.

EXAM:
NUCLEAR MEDICINE PET SKULL BASE TO THIGH
TECHNIQUE: 12.42 mCi F-18 FDG was injected intravenously. Full-ring PET imaging
was performed from the skull base to thigh after the radiotracer. CT
data was obtained and used for attenuation correction and anatomic
localization.
FASTING BLOOD GLUCOSE:  Value: 79 mg/dl

[Series 3: ct wb 5.0 b30f · axial · 5.0mm · 0.98mm/px · z∈[-1410,-542]mm · 3 of 290 slices shown]
[im 1/290]
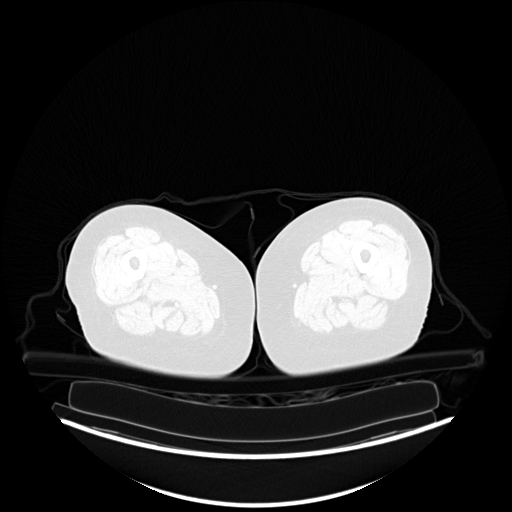
[im 145/290]
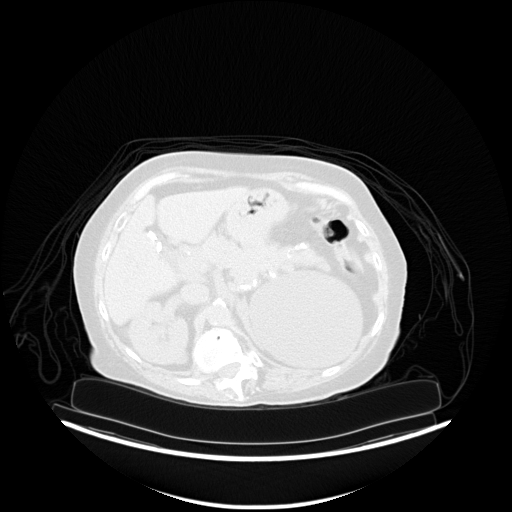
[im 290/290  brain]
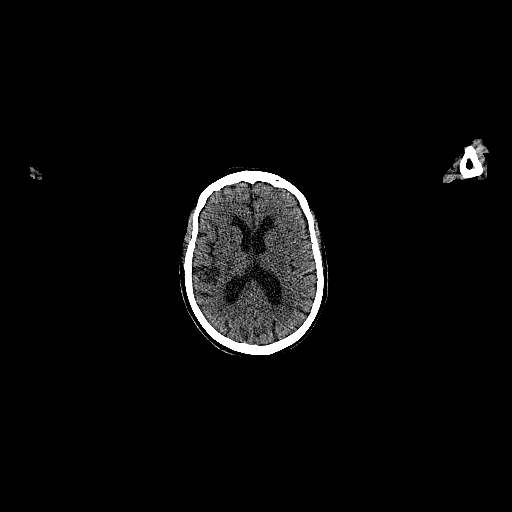

[Series 4: pet wb (ac) · axial · 5.0mm · 4.07mm/px · z∈[-1410,-542]mm · 3 of 290 slices shown]
[im 1/290]
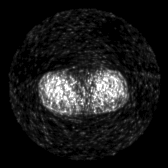
[im 145/290]
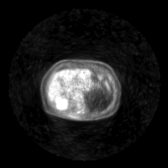
[im 290/290]
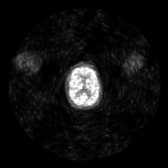

[Series 5: pet wb uncorrected (nac) · axial · 5.0mm · 4.07mm/px · z∈[-1410,-542]mm · 4 of 290 slices shown]
[im 1/290]
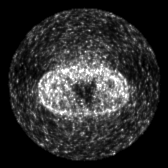
[im 97/290]
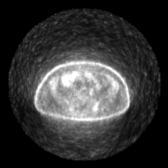
[im 193/290]
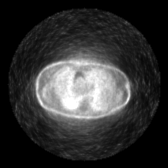
[im 290/290]
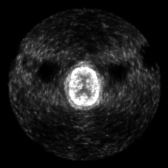

[Series 603: pet axial fused · 3 of 289 slices shown]
[im 1/289]
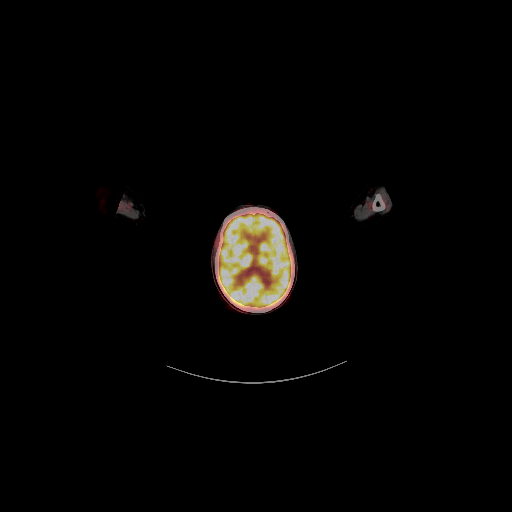
[im 97/289]
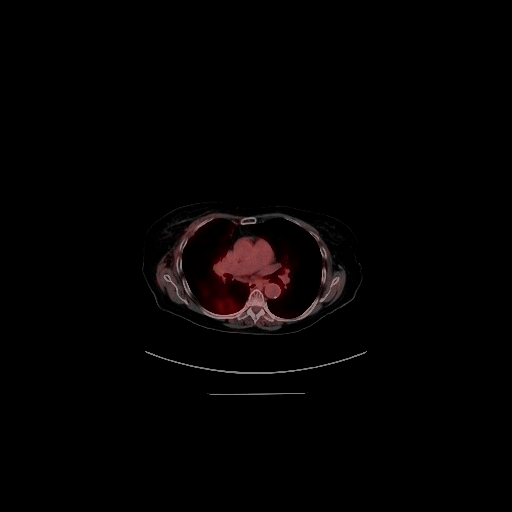
[im 289/289]
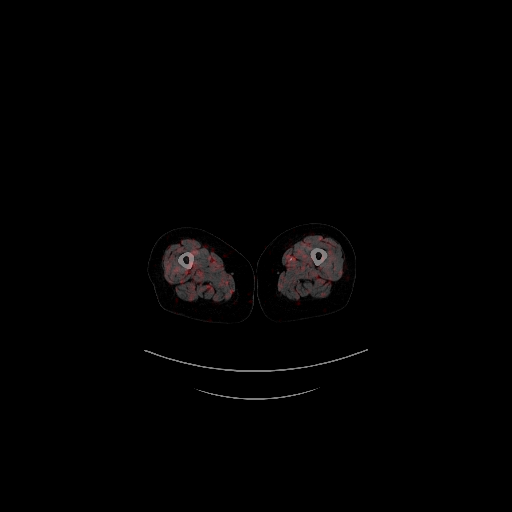

[Series 604: pet coronal fused · 1 of 90 slices shown]
[im 1/90]
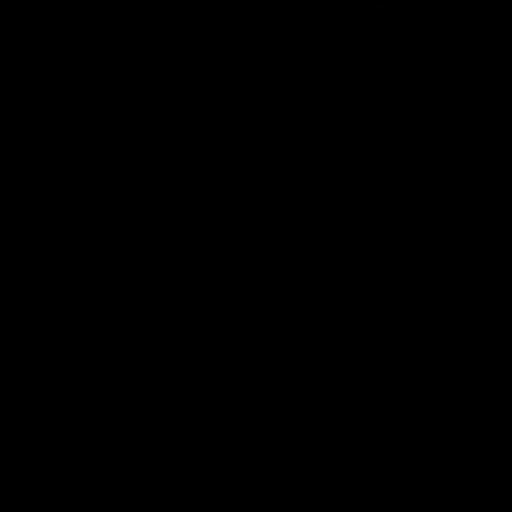

[Series 605: pet sagittal fused · 2 of 152 slices shown]
[im 1/152]
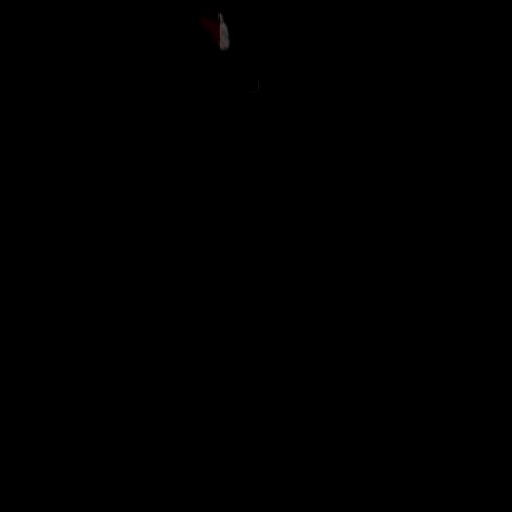
[im 152/152]
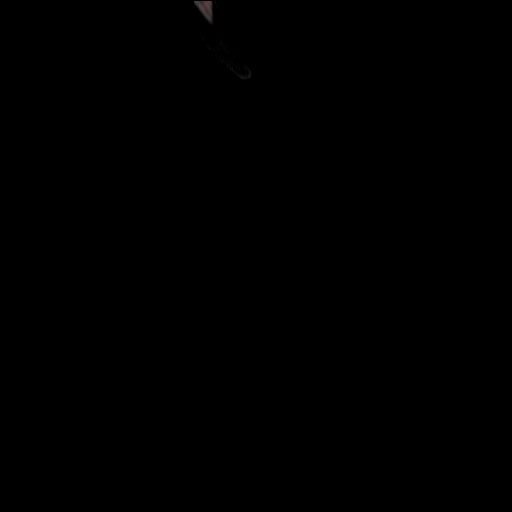

[Series 606: pet axial · 4 of 290 slices shown]
[im 1/290]
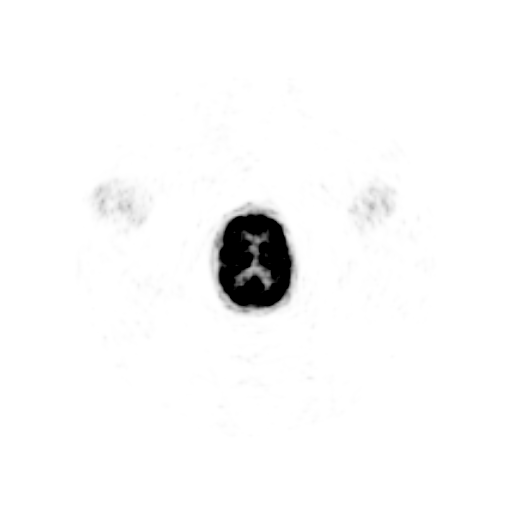
[im 97/290]
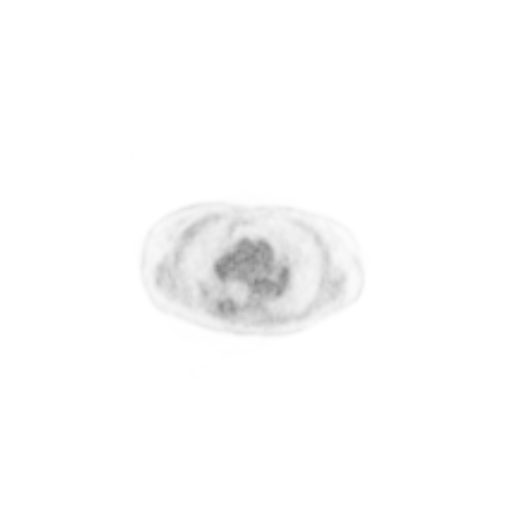
[im 193/290]
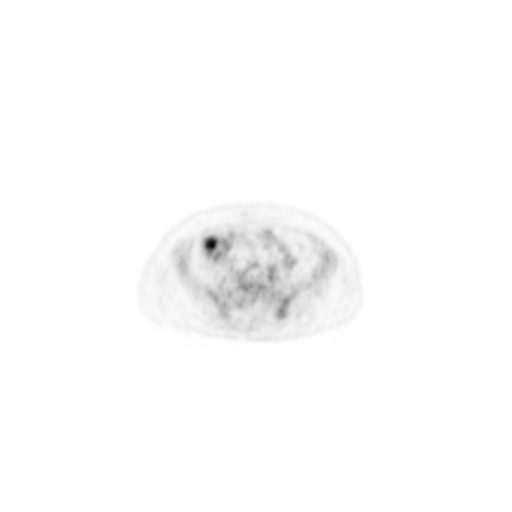
[im 290/290]
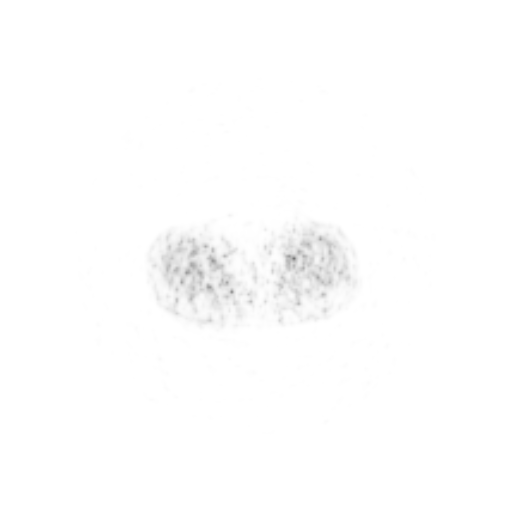

[Series 607: pet coronal · 1 of 103 slices shown]
[im 1/103]
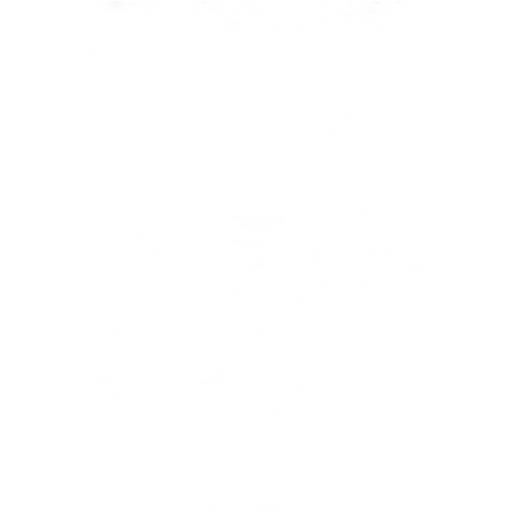

[Series 608: pet sagittal · 2 of 175 slices shown]
[im 1/175]
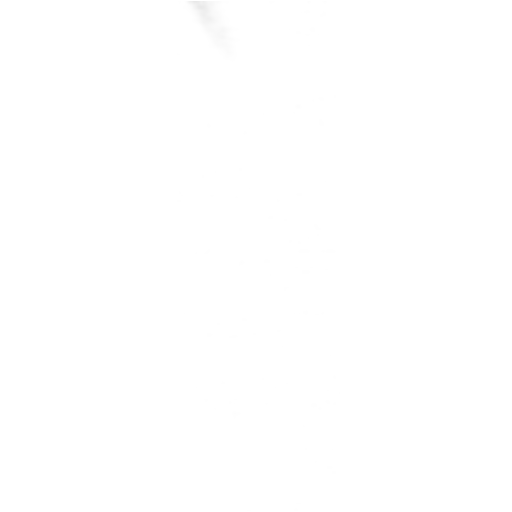
[im 175/175]
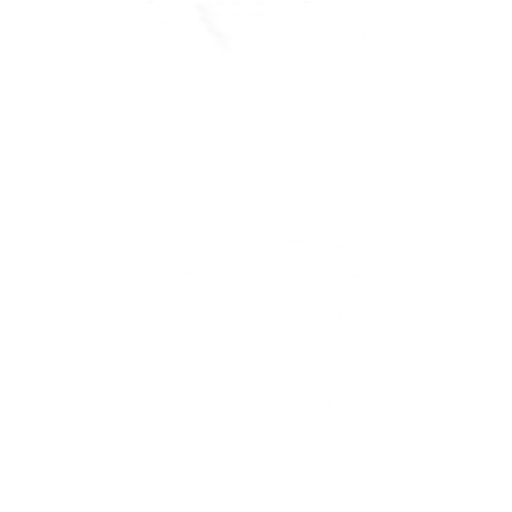

[Series 1038: results mm oncology reading · 0.89mm/px · 1 of 6 slices shown]
[im 1/6]
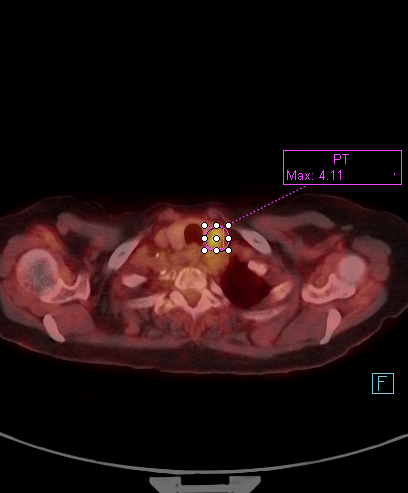

[24 of 25 positions shown; findings below may reference images not displayed]

FINDINGS: NECK

There are 2 adjacent left supraclavicular lymph nodes which are
hypermetabolic. The larger of these measures 10 mm short axis on
image 66 and has an SUV max of 4.1. No other hypermetabolic cervical
lymph nodes demonstrated.There are no lesions of the pharyngeal
mucosal space.

CHEST

The enlarged right paratracheal and right hilar lymph nodes on
recent CT are hypermetabolic. The largest right paratracheal node
measures 1.7 cm short axis on image 90 and has an SUV max of 10.0.
No hypermetabolic subcarinal, left hilar or axillary adenopathy. The
dominant spiculated right upper lobe mass is now partly obscured by
atelectasis, but measures approximately 3.3 x 3.1 cm on image 84 and
is hypermetabolic with an SUV max of 15.9. The other smaller
ground-glass opacities in the right upper lobe (image 84) and in the
superior segment of the right lower lobe (image 96) also demonstrate
low-level hypermetabolic activity (SUV max 2.1). There is a 9 mm
focal ground-glass opacity in the left upper lobe on image 84 which
also demonstrates low-level activity. There is increased patchy
airspace opacity dependently in the right lower lobe with associated
low level metabolic activity, likely inflammatory.

ABDOMEN/PELVIS

The recently biopsied dominant lesion in the right hepatic lobe is
hypermetabolic. This lesion measures approximately 3.5 cm on image
128 and has an SUV max of 10.6. There are at least 2 smaller
adjacent hypermetabolic metastases within the right hepatic lobe.
There is no suspicious activity within the spleen, pancreas, adrenal
glands or kidneys. There is low-level hypermetabolic activity within
a single lymph node in the porta hepatis (SUV max 4.0). There is no
other hypermetabolic nodal activity. There are low-density adnexal
lesions bilaterally status post hysterectomy without associated
abnormal metabolic activity. There are multiple low-density renal
lesions, including a dominant left renal cyst without suspicious
metabolic activity. Nonobstructing calculus noted in the upper pole
of the right kidney, diffuse atherosclerosis and sigmoid
diverticulosis noted.

SKELETON

There is a single hypermetabolic osseous metastasis within the right
ischium. This is lytic and has an SUV max of 10.2. Low-level
activity anteriorly at C1-2 is likely inflammatory. There is a
moderate thoracolumbar scoliosis.
IMPRESSION: 1. The dominant right upper lobe pulmonary mass is hypermetabolic,
most consistent with bronchogenic carcinoma. There are associated
left supraclavicular, right hilar and mediastinal nodal metastases,
at least 3 hepatic metastases, and a right ischial nodal metastasis.
2. There are additional ground-glass opacities in both lungs which
may represent synchronous adenocarcinoma.
3. Increased patchy dependent opacity in the right lower lobe,
likely inflammatory.
